# Patient Record
Sex: Male | Born: 1940
Health system: Southern US, Community
[De-identification: ages and names within clinical notes are randomized; demographics above are authoritative.]

## PROBLEM LIST (undated history)

## (undated) DIAGNOSIS — H9190 Unspecified hearing loss, unspecified ear: Secondary | ICD-10-CM

## (undated) DIAGNOSIS — J189 Pneumonia, unspecified organism: Secondary | ICD-10-CM

## (undated) DIAGNOSIS — I499 Cardiac arrhythmia, unspecified: Secondary | ICD-10-CM

## (undated) DIAGNOSIS — H409 Unspecified glaucoma: Secondary | ICD-10-CM

## (undated) DIAGNOSIS — Z8711 Personal history of peptic ulcer disease: Secondary | ICD-10-CM

## (undated) DIAGNOSIS — Z87442 Personal history of urinary calculi: Secondary | ICD-10-CM

## (undated) DIAGNOSIS — Z8719 Personal history of other diseases of the digestive system: Secondary | ICD-10-CM

## (undated) DIAGNOSIS — I10 Essential (primary) hypertension: Secondary | ICD-10-CM

## (undated) DIAGNOSIS — Z8669 Personal history of other diseases of the nervous system and sense organs: Secondary | ICD-10-CM

## (undated) DIAGNOSIS — G4730 Sleep apnea, unspecified: Secondary | ICD-10-CM

## (undated) DIAGNOSIS — M1990 Unspecified osteoarthritis, unspecified site: Secondary | ICD-10-CM

## (undated) DIAGNOSIS — I4891 Unspecified atrial fibrillation: Secondary | ICD-10-CM

## (undated) DIAGNOSIS — G629 Polyneuropathy, unspecified: Secondary | ICD-10-CM

## (undated) DIAGNOSIS — R7303 Prediabetes: Secondary | ICD-10-CM

## (undated) DIAGNOSIS — K219 Gastro-esophageal reflux disease without esophagitis: Secondary | ICD-10-CM

## (undated) DIAGNOSIS — F419 Anxiety disorder, unspecified: Secondary | ICD-10-CM

## (undated) HISTORY — DX: Gastro-esophageal reflux disease without esophagitis: K21.9

## (undated) HISTORY — DX: Essential (primary) hypertension: I10

## (undated) HISTORY — DX: Unspecified glaucoma: H40.9

## (undated) HISTORY — PX: EYE SURGERY: SHX253

## (undated) HISTORY — PX: INGUINAL HERNIA REPAIR: SUR1180

## (undated) HISTORY — PX: COLONOSCOPY: SHX174

## (undated) HISTORY — PX: ESOPHAGOGASTRODUODENOSCOPY: SHX1529

## (undated) HISTORY — DX: Sleep apnea, unspecified: G47.30

## (undated) HISTORY — DX: Polyneuropathy, unspecified: G62.9

---

## 2011-09-07 HISTORY — PX: OTHER SURGICAL HISTORY: SHX169

## 2012-09-06 HISTORY — PX: CATARACT EXTRACTION: SUR2

## 2014-09-18 DIAGNOSIS — I10 Essential (primary) hypertension: Secondary | ICD-10-CM | POA: Diagnosis not present

## 2014-09-18 DIAGNOSIS — K2970 Gastritis, unspecified, without bleeding: Secondary | ICD-10-CM | POA: Diagnosis not present

## 2014-09-18 DIAGNOSIS — M159 Polyosteoarthritis, unspecified: Secondary | ICD-10-CM | POA: Diagnosis not present

## 2014-09-18 DIAGNOSIS — K589 Irritable bowel syndrome without diarrhea: Secondary | ICD-10-CM | POA: Diagnosis not present

## 2014-09-21 DIAGNOSIS — Z1389 Encounter for screening for other disorder: Secondary | ICD-10-CM | POA: Diagnosis not present

## 2014-09-26 DIAGNOSIS — N201 Calculus of ureter: Secondary | ICD-10-CM | POA: Diagnosis not present

## 2014-10-03 DIAGNOSIS — K589 Irritable bowel syndrome without diarrhea: Secondary | ICD-10-CM | POA: Diagnosis not present

## 2014-10-03 DIAGNOSIS — M1990 Unspecified osteoarthritis, unspecified site: Secondary | ICD-10-CM | POA: Diagnosis not present

## 2014-10-03 DIAGNOSIS — N201 Calculus of ureter: Secondary | ICD-10-CM | POA: Diagnosis not present

## 2014-10-03 DIAGNOSIS — K219 Gastro-esophageal reflux disease without esophagitis: Secondary | ICD-10-CM | POA: Diagnosis not present

## 2014-10-03 DIAGNOSIS — I10 Essential (primary) hypertension: Secondary | ICD-10-CM | POA: Diagnosis not present

## 2014-10-04 DIAGNOSIS — K2970 Gastritis, unspecified, without bleeding: Secondary | ICD-10-CM | POA: Diagnosis not present

## 2014-10-04 DIAGNOSIS — I10 Essential (primary) hypertension: Secondary | ICD-10-CM | POA: Diagnosis not present

## 2014-10-04 DIAGNOSIS — R601 Generalized edema: Secondary | ICD-10-CM | POA: Diagnosis not present

## 2014-10-04 DIAGNOSIS — N200 Calculus of kidney: Secondary | ICD-10-CM | POA: Diagnosis not present

## 2014-10-04 DIAGNOSIS — K589 Irritable bowel syndrome without diarrhea: Secondary | ICD-10-CM | POA: Diagnosis not present

## 2014-10-08 DIAGNOSIS — Z466 Encounter for fitting and adjustment of urinary device: Secondary | ICD-10-CM | POA: Diagnosis not present

## 2014-10-08 DIAGNOSIS — M1990 Unspecified osteoarthritis, unspecified site: Secondary | ICD-10-CM | POA: Diagnosis not present

## 2014-10-08 DIAGNOSIS — K589 Irritable bowel syndrome without diarrhea: Secondary | ICD-10-CM | POA: Diagnosis not present

## 2014-10-08 DIAGNOSIS — K219 Gastro-esophageal reflux disease without esophagitis: Secondary | ICD-10-CM | POA: Diagnosis not present

## 2014-10-08 DIAGNOSIS — I878 Other specified disorders of veins: Secondary | ICD-10-CM | POA: Diagnosis not present

## 2014-10-08 DIAGNOSIS — I10 Essential (primary) hypertension: Secondary | ICD-10-CM | POA: Diagnosis not present

## 2014-10-08 DIAGNOSIS — N201 Calculus of ureter: Secondary | ICD-10-CM | POA: Diagnosis not present

## 2014-10-21 DIAGNOSIS — N201 Calculus of ureter: Secondary | ICD-10-CM | POA: Diagnosis not present

## 2014-11-01 DIAGNOSIS — H4011X3 Primary open-angle glaucoma, severe stage: Secondary | ICD-10-CM | POA: Diagnosis not present

## 2014-11-04 DIAGNOSIS — N200 Calculus of kidney: Secondary | ICD-10-CM | POA: Diagnosis not present

## 2014-11-04 DIAGNOSIS — R601 Generalized edema: Secondary | ICD-10-CM | POA: Diagnosis not present

## 2014-11-04 DIAGNOSIS — I499 Cardiac arrhythmia, unspecified: Secondary | ICD-10-CM | POA: Diagnosis not present

## 2014-11-04 DIAGNOSIS — M159 Polyosteoarthritis, unspecified: Secondary | ICD-10-CM | POA: Diagnosis not present

## 2014-12-02 DIAGNOSIS — H908 Mixed conductive and sensorineural hearing loss, unspecified: Secondary | ICD-10-CM | POA: Diagnosis not present

## 2014-12-03 DIAGNOSIS — I509 Heart failure, unspecified: Secondary | ICD-10-CM | POA: Diagnosis not present

## 2014-12-03 DIAGNOSIS — B9710 Unspecified enterovirus as the cause of diseases classified elsewhere: Secondary | ICD-10-CM | POA: Diagnosis not present

## 2014-12-03 DIAGNOSIS — J029 Acute pharyngitis, unspecified: Secondary | ICD-10-CM | POA: Diagnosis not present

## 2014-12-05 DIAGNOSIS — H902 Conductive hearing loss, unspecified: Secondary | ICD-10-CM | POA: Diagnosis not present

## 2014-12-05 DIAGNOSIS — H6520 Chronic serous otitis media, unspecified ear: Secondary | ICD-10-CM | POA: Diagnosis not present

## 2014-12-13 DIAGNOSIS — M7062 Trochanteric bursitis, left hip: Secondary | ICD-10-CM | POA: Diagnosis not present

## 2014-12-13 DIAGNOSIS — M25552 Pain in left hip: Secondary | ICD-10-CM | POA: Diagnosis not present

## 2014-12-16 DIAGNOSIS — H908 Mixed conductive and sensorineural hearing loss, unspecified: Secondary | ICD-10-CM | POA: Diagnosis not present

## 2014-12-26 DIAGNOSIS — I10 Essential (primary) hypertension: Secondary | ICD-10-CM | POA: Diagnosis not present

## 2014-12-26 DIAGNOSIS — I83028 Varicose veins of left lower extremity with ulcer other part of lower leg: Secondary | ICD-10-CM | POA: Diagnosis not present

## 2014-12-26 DIAGNOSIS — R58 Hemorrhage, not elsewhere classified: Secondary | ICD-10-CM | POA: Diagnosis not present

## 2015-02-07 DIAGNOSIS — R601 Generalized edema: Secondary | ICD-10-CM | POA: Diagnosis not present

## 2015-02-07 DIAGNOSIS — I6000 Nontraumatic subarachnoid hemorrhage from unspecified carotid siphon and bifurcation: Secondary | ICD-10-CM | POA: Diagnosis not present

## 2015-02-07 DIAGNOSIS — M109 Gout, unspecified: Secondary | ICD-10-CM | POA: Diagnosis not present

## 2015-02-07 DIAGNOSIS — I509 Heart failure, unspecified: Secondary | ICD-10-CM | POA: Diagnosis not present

## 2015-02-20 DIAGNOSIS — H25811 Combined forms of age-related cataract, right eye: Secondary | ICD-10-CM | POA: Diagnosis not present

## 2015-02-28 DIAGNOSIS — L03032 Cellulitis of left toe: Secondary | ICD-10-CM | POA: Diagnosis not present

## 2015-03-12 DIAGNOSIS — Z87820 Personal history of traumatic brain injury: Secondary | ICD-10-CM | POA: Diagnosis not present

## 2015-03-12 DIAGNOSIS — R7309 Other abnormal glucose: Secondary | ICD-10-CM | POA: Diagnosis not present

## 2015-03-12 DIAGNOSIS — K219 Gastro-esophageal reflux disease without esophagitis: Secondary | ICD-10-CM | POA: Diagnosis not present

## 2015-03-12 DIAGNOSIS — Z96611 Presence of right artificial shoulder joint: Secondary | ICD-10-CM | POA: Diagnosis not present

## 2015-03-12 DIAGNOSIS — Z9181 History of falling: Secondary | ICD-10-CM | POA: Diagnosis not present

## 2015-03-12 DIAGNOSIS — Z974 Presence of external hearing-aid: Secondary | ICD-10-CM | POA: Diagnosis not present

## 2015-03-12 DIAGNOSIS — H4011X3 Primary open-angle glaucoma, severe stage: Secondary | ICD-10-CM | POA: Diagnosis not present

## 2015-03-12 DIAGNOSIS — H25811 Combined forms of age-related cataract, right eye: Secondary | ICD-10-CM | POA: Diagnosis not present

## 2015-03-12 DIAGNOSIS — Z87442 Personal history of urinary calculi: Secondary | ICD-10-CM | POA: Diagnosis not present

## 2015-03-12 DIAGNOSIS — M1991 Primary osteoarthritis, unspecified site: Secondary | ICD-10-CM | POA: Diagnosis not present

## 2015-03-12 DIAGNOSIS — R600 Localized edema: Secondary | ICD-10-CM | POA: Diagnosis not present

## 2015-03-12 DIAGNOSIS — I10 Essential (primary) hypertension: Secondary | ICD-10-CM | POA: Diagnosis not present

## 2015-03-13 DIAGNOSIS — Z9841 Cataract extraction status, right eye: Secondary | ICD-10-CM | POA: Diagnosis not present

## 2015-03-27 DIAGNOSIS — L600 Ingrowing nail: Secondary | ICD-10-CM | POA: Diagnosis not present

## 2015-03-27 DIAGNOSIS — M779 Enthesopathy, unspecified: Secondary | ICD-10-CM | POA: Diagnosis not present

## 2015-05-06 DIAGNOSIS — H409 Unspecified glaucoma: Secondary | ICD-10-CM | POA: Diagnosis not present

## 2015-05-06 DIAGNOSIS — I8393 Asymptomatic varicose veins of bilateral lower extremities: Secondary | ICD-10-CM | POA: Diagnosis not present

## 2015-05-06 DIAGNOSIS — I10 Essential (primary) hypertension: Secondary | ICD-10-CM | POA: Diagnosis not present

## 2015-05-06 DIAGNOSIS — M25552 Pain in left hip: Secondary | ICD-10-CM | POA: Diagnosis not present

## 2015-05-06 DIAGNOSIS — R609 Edema, unspecified: Secondary | ICD-10-CM | POA: Diagnosis not present

## 2015-05-06 DIAGNOSIS — Z79899 Other long term (current) drug therapy: Secondary | ICD-10-CM | POA: Diagnosis not present

## 2015-05-08 ENCOUNTER — Ambulatory Visit: Payer: Medicare Other | Attending: Family Medicine

## 2015-05-08 DIAGNOSIS — M25659 Stiffness of unspecified hip, not elsewhere classified: Secondary | ICD-10-CM

## 2015-05-08 DIAGNOSIS — M25559 Pain in unspecified hip: Secondary | ICD-10-CM | POA: Diagnosis not present

## 2015-05-08 NOTE — Patient Instructions (Signed)
Knee to Chest (Flexion)   Pull knee toward chest. Feel stretch in lower back or buttock area. Breathing deeply, Hold __20__ seconds. Repeat with other knee. Repeat _3___ times. Do _3___ sessions per day.  http://gt2.exer.us/225   Copyright  VHI. All rights reserved.  Butterfly, Supine   Lie on back, feet together. Lower knees toward floor. Hold _20__ seconds. Repeat _3__ times per session. Do _3__ sessions per day.  Copyright  VHI. All rights reserved.  HIP: Hamstrings - Short Sitting   Rest leg on raised surface. Keep knee straight. Lift chest. Hold _20__ seconds. _3__ reps per set, _3__ sets per day  Copyright  VHI. All rights reserved.  [facility] [address] [place], [place] [address] Phone # [phone] Fax [phone]

## 2015-05-08 NOTE — Therapy (Signed)
[facility] [address], [place], [place], [address] Phone: [phone]   Fax:  [phone]  Physical Therapy Evaluation  Patient Details  Name: Glen Bolton MRN: [id number] Date of Birth: 12-Jul-1941 Referring Provider:  Lujean Amel, MD  Encounter Date: 05/08/2015      PT End of Session - 05/08/15 1101    Visit Number 1   Number of Visits 10   Date for PT Re-Evaluation 07/03/15   PT Start Time 1030   PT Stop Time 1100   PT Time Calculation (min) 30 min   Activity Tolerance Patient tolerated treatment well;Other (comment)  pt 15 minutes late   Behavior During Therapy WFL for tasks assessed/performed      Past Medical History  Diagnosis Date  . Hypertension   . GERD (gastroesophageal reflux disease)     Past Surgical History  Procedure Laterality Date  . Eye surgery    . Rotator cuff surgery Right 2013    There were no vitals filed for this visit.  Visit Diagnosis:  Hip pain, unspecified laterality - Plan: PT plan of care cert/re-cert  Stiffness of hip joint, unspecified laterality - Plan: PT plan of care cert/re-cert      Subjective Assessment - 05/08/15 1033    Subjective Pt reports to PT with complaints of bilateral hip and knee pain of a chronic nature.  MD suspects OA.  Pt has had injections into knees 5 months ago.  Pt was working full time until about 2 months ago.  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   Pertinent History fall last year at work   Limitations Walking;Standing   How long can you stand comfortably? 1-2 hours   How long can you walk comfortably? 1-2 hours   Diagnostic tests x-ray of lumbar spine and Lt hip- result was OA, DDD per pt report.  Bursitis in Lt hip.     Currently in Pain? Yes   Pain Location Hip  and knees   Pain Orientation Right;Left   Pain Descriptors / Indicators Dull;Aching   Pain Type Chronic pain   Pain Onset More than a month ago   Pain Frequency Intermittent   Aggravating Factors  standing and walking, bending down to pick-up golf ball.     Pain Relieving Factors not taking meds due to sensitivity, rest/not standing   Effect of Pain on Daily Activities limited standing and walking   Multiple Pain Sites No            [facility] PT Assessment - 05/08/15 0001    Assessment   Medical Diagnosis Lt hip pain- evaluate both hips   Onset Date/Surgical Date 05/07/13   Next MD Visit none scheduled   Precautions   Precautions None   Restrictions   Weight Bearing Restrictions No   Balance Screen   Has the patient fallen in the past 6 months No   Has the patient had a decrease in activity level because of a fear of falling?  No   Is the patient reluctant to leave their home because of a fear of falling?  No   Home Environment   Living Environment Private residence   Living Arrangements Spouse/significant other   Type of [employer] Access Level entry   Prior Function   Level of Independence Independent   Cognition   Overall Cognitive Status Within Functional Limits for tasks assessed   Observation/Other Assessments   Focus on  Therapeutic Outcomes (FOTO)  60% limitation   Posture/Postural Control   Posture/Postural Control Postural limitations   Postural Limitations Increased thoracic kyphosis;Rounded Shoulders;Forward head   ROM / Strength   AROM / PROM / Strength AROM;PROM;Strength   AROM   Overall AROM  Within functional limits for tasks performed   Overall AROM Comments Lumbar AROM is full withtout pain    PROM   Overall PROM  Deficits   Overall PROM Comments Bilateral hip flexibility limited by 25-50%.  Most limited with SLR and abduction   Strength   Overall Strength Within functional limits for tasks performed   Overall Strength Comments 4+/5 to 5/5 bilateral LE strength   Palpation   Palpation comment No significant palpable tenderness today.    Ambulation/Gait   Ambulation/Gait Yes    Ambulation/Gait Assistance 7: Independent   Gait Pattern Within Functional Limits                           PT Education - 05/08/15 1052    Education provided Yes   Education Details HEP: butterfly, hamstring and knee to chest stretch   Person(s) Educated Patient   Methods Explanation;Demonstration;Handout   Comprehension Verbalized understanding;Returned demonstration          PT Short Term Goals - 05/08/15 1107    PT SHORT TERM GOAL #1   Title be independent in initial HEP   Time 4   Period Weeks   Status New   PT SHORT TERM GOAL #2   Title report 30% reduction in bil. hip pain with standing and walking   Time 4   Period Weeks   Status New           PT Long Term Goals - 05/08/15 1039    PT LONG TERM GOAL #1   Title be independent in advanced HEP   Time 8   Period Weeks   Status New   PT LONG TERM GOAL #2   Title reduce FOTO to < or = to 47% limitaiton   Time 8   Period Weeks   Status New   PT LONG TERM GOAL #3   Title report a 60% reduction in bilateral hip pain with standing and walking   Time 8   Period Weeks   Status New   PT LONG TERM GOAL #4   Title stand and walk 50% longer before need to rest due to pain   Time 8   Period Weeks   Status New               Plan - 05/08/15 1105    Clinical Impression Statement Pt presents to PT with complaints of bilateral hip and knee pain of a chronic nature.  Pt had recent x-ray indicating OA and Lt hip bursitis. Pt demonstrates stiffness in both hips and reports painful and limited standing tolerance.  FOTO score is 60% limitation.  Pt will benefit from skilled PT to improve hip flexiblity and strength to improve abilty to stand longer with less pain.     Pt will benefit from skilled therapeutic intervention in order to improve on the following deficits Pain;Impaired flexibility;Decreased activity tolerance   Rehab Potential Good   PT Frequency 2x / week   PT Duration 8 weeks   PT  Treatment/Interventions ADLs/Self Care Home Management;Moist Heat;Electrical Stimulation;Cryotherapy;Ultrasound;Functional mobility training;Therapeutic activities;Therapeutic exercise;Manual techniques;Patient/family education;Neuromuscular re-education;Passive range of motion   PT Next Visit Plan Bilateral hip flexibility and strength  Consulted and Agree with Plan of Care Patient          G-Codes - May 17, 2015 1030    Functional Assessment Tool Used FOTO: 60% limitation   Functional Limitation Mobility: Walking and moving around   Mobility: Walking and Moving Around Current Status [id number]) At least 60 percent but less than 80 percent impaired, limited or restricted   Mobility: Walking and Moving Around Goal Status [phone]) At least 40 percent but less than 60 percent impaired, limited or restricted       Problem List There are no active problems to display for this patient.   Tahesha Skeet, PT May 17, 2015, 11:10 AM  [facility] [address] [place], [place], [address] Phone: [phone]   Fax:  [phone]

## 2015-05-13 ENCOUNTER — Ambulatory Visit: Payer: Medicare Other | Admitting: Rehabilitation

## 2015-05-13 DIAGNOSIS — M25659 Stiffness of unspecified hip, not elsewhere classified: Secondary | ICD-10-CM | POA: Diagnosis not present

## 2015-05-13 DIAGNOSIS — M25559 Pain in unspecified hip: Secondary | ICD-10-CM | POA: Diagnosis not present

## 2015-05-13 NOTE — Therapy (Signed)
[facility] [address], [place], [place], [address] Phone: [phone]   Fax:  [phone]  Physical Therapy Treatment  Patient Details  Name: Glen Bolton MRN: [id number] Date of Birth: 04/02/1941 Referring Provider:  Lujean Amel, MD  Encounter Date: 05/13/2015      PT End of Session - 05/13/15 1446    Visit Number 2   Number of Visits 10   Date for PT Re-Evaluation 07/03/15   PT Start Time 1400   PT Stop Time 1445   PT Time Calculation (min) 45 min   Activity Tolerance Patient tolerated treatment well      Past Medical History  Diagnosis Date  . Hypertension   . GERD (gastroesophageal reflux disease)     Past Surgical History  Procedure Laterality Date  . Eye surgery    . Rotator cuff surgery Right 2013    There were no vitals filed for this visit.  Visit Diagnosis:  Hip pain, unspecified laterality  Stiffness of hip joint, unspecified laterality      Subjective Assessment - 05/13/15 1406    Subjective only feeling some soreness with the new stretches.  no new complaints   Currently in Pain? Yes   Pain Score 1    Pain Location Hip   Pain Orientation Right;Left   Aggravating Factors  walking uphill                         [facility] Adult PT Treatment/Exercise - 05/13/15 0001    Exercises   Exercises Knee/Hip   Knee/Hip Exercises: Stretches   Passive Hamstring Stretch Both;3 reps;20 seconds   Passive Hamstring Stretch Limitations with strap   Quad Stretch Both;3 reps;20 seconds   Quad Stretch Limitations prone   Hip Flexor Stretch Both;2 reps;30 seconds   Hip Flexor Stretch Limitations off edge of plinth    Piriformis Stretch Both;3 reps;20 seconds   Other Knee/Hip Stretches butterfly stretch supine 3x20   Knee/Hip Exercises: Aerobic   Recumbent Bike level 2 x 56min   Manual Therapy   Manual therapy comments manual hip PROM to limitations and joint distraction with the  belt plus oscillations into IR/ER bil grade IV                  PT Short Term Goals - 05/08/15 1107    PT SHORT TERM GOAL #1   Title be independent in initial HEP   Time 4   Period Weeks   Status New   PT SHORT TERM GOAL #2   Title report 30% reduction in bil. hip pain with standing and walking   Time 4   Period Weeks   Status New           PT Long Term Goals - 05/08/15 1039    PT LONG TERM GOAL #1   Title be independent in advanced HEP   Time 8   Period Weeks   Status New   PT LONG TERM GOAL #2   Title reduce FOTO to < or = to 47% limitaiton   Time 8   Period Weeks   Status New   PT LONG TERM GOAL #3   Title report a 60% reduction in bilateral hip pain with standing and walking   Time 8   Period Weeks   Status New   PT LONG TERM GOAL #4   Title stand and walk 50% longer before need to rest due to pain  Time 8   Period Weeks   Status New               Plan - 05/13/15 1423    Clinical Impression Statement pt presents with good knowledge of stretches today.  significant flexibility limitations R>L but bilaterally.  focused on flexibility today with progression to strength   Pt will benefit from skilled therapeutic intervention in order to improve on the following deficits Impaired flexibility;Decreased activity tolerance   Rehab Potential Good   PT Next Visit Plan Bilateral hip flexibility and strength        Problem List There are no active problems to display for this patient.   Stark Bray, DPT, CMP 05/13/2015, 2:47 PM  [facility] [address], [place], [place], [address] Phone: [phone]   Fax:  [phone]

## 2015-05-21 ENCOUNTER — Ambulatory Visit: Payer: Medicare Other | Admitting: Physical Therapy

## 2015-05-21 ENCOUNTER — Encounter: Payer: Self-pay | Admitting: Physical Therapy

## 2015-05-21 ENCOUNTER — Encounter: Payer: Self-pay | Admitting: Podiatry

## 2015-05-21 ENCOUNTER — Ambulatory Visit (INDEPENDENT_AMBULATORY_CARE_PROVIDER_SITE_OTHER): Payer: Medicare Other | Admitting: Podiatry

## 2015-05-21 VITALS — BP 122/82 | HR 77 | Resp 17

## 2015-05-21 DIAGNOSIS — M79676 Pain in unspecified toe(s): Secondary | ICD-10-CM

## 2015-05-21 DIAGNOSIS — M25559 Pain in unspecified hip: Secondary | ICD-10-CM | POA: Diagnosis not present

## 2015-05-21 DIAGNOSIS — B351 Tinea unguium: Secondary | ICD-10-CM

## 2015-05-21 DIAGNOSIS — M25659 Stiffness of unspecified hip, not elsewhere classified: Secondary | ICD-10-CM

## 2015-05-21 NOTE — Therapy (Signed)
[facility] [address], [place], [place], [address] Phone: [phone]   Fax:  [phone]  Physical Therapy Treatment  Patient Details  Name: Glen Bolton MRN: [id number] Date of Birth: 06-05-1941 Referring Provider:  Lujean Amel, MD  Encounter Date: 05/21/2015      PT End of Session - 05/21/15 1204    Visit Number 3   Number of Visits 10   Date for PT Re-Evaluation 07/03/15   PT Start Time 1601   PT Stop Time 1230   PT Time Calculation (min) 43 min   Activity Tolerance Patient tolerated treatment well   Behavior During Therapy [facility] for tasks assessed/performed      Past Medical History  Diagnosis Date  . Hypertension   . GERD (gastroesophageal reflux disease)     Past Surgical History  Procedure Laterality Date  . Eye surgery    . Rotator cuff surgery Right 2013    There were no vitals filed for this visit.  Visit Diagnosis:  Stiffness of hip joint, unspecified laterality  Hip pain, unspecified laterality      Subjective Assessment - 05/21/15 1151    Subjective My hips are feeling more flexible. Pt is experiencing some vertigo today.    Currently in Pain? No/denies  More stiff    Multiple Pain Sites No                         [facility] Adult PT Treatment/Exercise - 05/21/15 0001    Knee/Hip Exercises: Stretches   Active Hamstring Stretch Both;3 reps;20 seconds   Active Hamstring Stretch Limitations Used strap   Quad Stretch Both;2 reps;20 seconds  performed supine with foam roll under pelvis.   Other Knee/Hip Stretches Single knee to chest with towel  3x15 sec hold bil.    Knee/Hip Exercises: Aerobic   Nustep L1 seat #9 6 min   Knee/Hip Exercises: Supine   Other Supine Knee/Hip Exercises AROM IR/ER 20x  flexion 20x                   PT Short Term Goals - 05/21/15 1159    PT SHORT TERM GOAL #1   Title be independent in initial HEP   Time 4   Period Weeks    Status Achieved   PT SHORT TERM GOAL #2   Title report 30% reduction in bil. hip pain with standing and walking   Period Weeks   Status --  10%-15%           PT Long Term Goals - 05/08/15 1039    PT LONG TERM GOAL #1   Title be independent in advanced HEP   Time 8   Period Weeks   Status New   PT LONG TERM GOAL #2   Title reduce FOTO to < or = to 47% limitaiton   Time 8   Period Weeks   Status New   PT LONG TERM GOAL #3   Title report a 60% reduction in bilateral hip pain with standing and walking   Time 8   Period Weeks   Status New   PT LONG TERM GOAL #4   Title stand and walk 50% longer before need to rest due to pain   Time 8   Period Weeks   Status New               Plan - 05/21/15 1210    Clinical Impression Statement Pt  performed all hip stretches well, asking him to relax into them sllightly more. Pain is already 10%-15%  reduced with standing/walking activities. Tried to deepend stretch into quads and hip flexors using foam roll.    Pt will benefit from skilled therapeutic intervention in order to improve on the following deficits Impaired flexibility;Decreased activity tolerance   Rehab Potential Good   PT Frequency 2x / week   PT Duration 8 weeks   PT Treatment/Interventions ADLs/Self Care Home Management;Moist Heat;Electrical Stimulation;Cryotherapy;Ultrasound;Functional mobility training;Therapeutic activities;Therapeutic exercise;Manual techniques;Patient/family education;Neuromuscular re-education;Passive range of motion   PT Next Visit Plan Bilateral hip flexibility and strength   Consulted and Agree with Plan of Care Patient        Problem List There are no active problems to display for this patient.   COCHRAN,JENNIFER, PTA 05/21/2015, 12:22 PM  [facility] [address] [place], [place], [address] Phone: [phone]   Fax:  [phone]

## 2015-05-21 NOTE — Progress Notes (Signed)
Subjective:     Patient ID: Glen Bolton, male   DOB: 1941-07-11, 74 y.o.   MRN: [id number]  HPIThis patient presents to office fir nail care of both feet.  He previously was seen by Dr. Gershon Mussel who performed an I & D medial border left big toe.  He says the pain and redness has resolved and he now has black tissue in nail groove.  The hallux nails are painful from pressure.   Review of Systems     Objective:   Physical Exam GENERAL APPEARANCE: Alert, conversant. Appropriately groomed. No acute distress.  VASCULAR: Pedal pulses palpable at  [facility] and PT bilateral.  Capillary refill time is immediate to all digits,  Normal temperature gradient.  Digital hair growth is present bilateral  NEUROLOGIC: sensation is normal to 5.07 monofilament at 5/5 sites bilateral.  Light touch is intact bilateral, Muscle strength normal.  MUSCULOSKELETAL: acceptable muscle strength, tone and stability bilateral.  Intrinsic muscluature intact bilateral.  Rectus appearance of foot and digits noted bilateral.  Asymptomatic HAV B/L  DERMATOLOGIC: skin color, texture, and turgor are within normal limits.  No preulcerative lesions or ulcers  are seen, no interdigital maceration noted.  No open lesions present.  . No drainage noted. NAILS  Thick disfigured incurvated nails both big toes. Healing along medial border left hallux.      Assessment:     Onychomycosis     Plan:     IE  Debride nails.  RTC 3 months.

## 2015-05-23 ENCOUNTER — Ambulatory Visit: Payer: Medicare Other | Admitting: Physical Therapy

## 2015-05-23 ENCOUNTER — Encounter: Payer: Self-pay | Admitting: Physical Therapy

## 2015-05-23 DIAGNOSIS — M25559 Pain in unspecified hip: Secondary | ICD-10-CM

## 2015-05-23 DIAGNOSIS — M25659 Stiffness of unspecified hip, not elsewhere classified: Secondary | ICD-10-CM | POA: Diagnosis not present

## 2015-05-23 NOTE — Therapy (Signed)
[facility] [address] [place], [place], [address] Phone: [phone]   Fax:  [phone]  Physical Therapy Treatment  Patient Details  Name: Glen Bolton MRN: [id number] Date of Birth: Apr 06, 1941 Referring Provider:  Lujean Amel, MD  Encounter Date: 05/23/2015      PT End of Session - 05/23/15 0947    Visit Number 4   Number of Visits 10   Date for PT Re-Evaluation 07/03/15   PT Start Time 0930   PT Stop Time 1015   PT Time Calculation (min) 45 min   Activity Tolerance Patient tolerated treatment well   Behavior During Therapy [facility] for tasks assessed/performed      Past Medical History  Diagnosis Date  . Hypertension   . GERD (gastroesophageal reflux disease)     Past Surgical History  Procedure Laterality Date  . Eye surgery    . Rotator cuff surgery Right 2013    There were no vitals filed for this visit.  Visit Diagnosis:  Stiffness of hip joint, unspecified laterality  Hip pain, unspecified laterality      Subjective Assessment - 05/23/15 0935    Subjective Pt with pain in bil hips Lt> then Rt. Reports after last session with incr back pain, wishes not to practice on faom roll today. Pain in bil hips rated as 1-2/10 today   Currently in Pain? Yes   Pain Score 2    Pain Location Hip   Pain Orientation Right;Left   Pain Descriptors / Indicators Aching;Dull   Pain Type Chronic pain   Pain Onset More than a month ago   Pain Frequency Intermittent   Multiple Pain Sites No                         [facility] Adult PT Treatment/Exercise - 05/23/15 0001    Exercises   Exercises Knee/Hip   Knee/Hip Exercises: Stretches   Active Hamstring Stretch Both;3 reps;20 seconds  at stairs   Sports administrator Both;3 reps;20 seconds   with contract/relax techniue, and practiced with strap for    Hip Flexor Stretch Both;3 reps;30 seconds  in thomasgrip at hi/lo table   Other Knee/Hip  Stretches Single knee to chest with towel  with strap   Knee/Hip Exercises: Aerobic   Nustep L1 seat & arms #9 x 8 min   Knee/Hip Exercises: Prone   Other Prone Exercises Cobra x 10 with 5 sec hold pillow under belly    Manual Therapy   Manual therapy comments traction grade 3 including ER/IR, Rt hip into abd with flexed hip/knee grade 3                   PT Short Term Goals - 05/21/15 1159    PT SHORT TERM GOAL #1   Title be independent in initial HEP   Time 4   Period Weeks   Status Achieved   PT SHORT TERM GOAL #2   Title report 30% reduction in bil. hip pain with standing and walking   Period Weeks   Status --  10%-15%           PT Long Term Goals - 05/08/15 1039    PT LONG TERM GOAL #1   Title be independent in advanced HEP   Time 8   Period Weeks   Status New   PT LONG TERM GOAL #2   Title reduce FOTO to < or = to 47% limitaiton  Time 8   Period Weeks   Status New   PT LONG TERM GOAL #3   Title report a 60% reduction in bilateral hip pain with standing and walking   Time 8   Period Weeks   Status New   PT LONG TERM GOAL #4   Title stand and walk 50% longer before need to rest due to pain   Time 8   Period Weeks   Status New               Plan - 05/23/15 0947    Clinical Impression Statement Pt with godd demo of stretching exercises, but limited by tight muscle grpoups.. Pt will continue to benefit from skilled PT to adress flexibility bil hip, strength and endurance   Rehab Potential Good   PT Frequency 2x / week   PT Duration 8 weeks   PT Treatment/Interventions ADLs/Self Care Home Management;Moist Heat;Electrical Stimulation;Cryotherapy;Ultrasound;Functional mobility training;Therapeutic activities;Therapeutic exercise;Manual techniques;Patient/family education;Neuromuscular re-education;Passive range of motion   PT Next Visit Plan Bilateral hip flexibility and strength   Consulted and Agree with Plan of Care Patient         Problem List There are no active problems to display for this patient.   NAUMANN-HOUEGNIFIO,Evalise Abruzzese PTA 05/23/2015, 10:16 AM  [facility] [address] [place], [place], [address] Phone: [phone]   Fax:  [phone]

## 2015-05-27 ENCOUNTER — Ambulatory Visit: Payer: Medicare Other | Admitting: Physical Therapy

## 2015-05-27 ENCOUNTER — Encounter: Payer: Self-pay | Admitting: Physical Therapy

## 2015-05-27 DIAGNOSIS — M25559 Pain in unspecified hip: Secondary | ICD-10-CM

## 2015-05-27 DIAGNOSIS — M25659 Stiffness of unspecified hip, not elsewhere classified: Secondary | ICD-10-CM | POA: Diagnosis not present

## 2015-05-27 NOTE — Therapy (Signed)
[facility] [address], [place], [place], [address] Phone: [phone]   Fax:  [phone]  Physical Therapy Treatment  Patient Details  Name: Glen Bolton MRN: [id number] Date of Birth: Jun 05, 1941 Referring Provider:  Lujean Amel, MD  Encounter Date: 05/27/2015      PT End of Session - 05/27/15 1316    Visit Number 5   Number of Visits 10   Date for PT Re-Evaluation 07/03/15   PT Start Time 1638   PT Stop Time 1322   PT Time Calculation (min) 47 min   Activity Tolerance Patient tolerated treatment well   Behavior During Therapy [facility] for tasks assessed/performed      Past Medical History  Diagnosis Date  . Hypertension   . GERD (gastroesophageal reflux disease)     Past Surgical History  Procedure Laterality Date  . Eye surgery    . Rotator cuff surgery Right 2013    There were no vitals filed for this visit.  Visit Diagnosis:  Stiffness of hip joint, unspecified laterality  Hip pain, unspecified laterality      Subjective Assessment - 05/27/15 1238    Subjective Pt with bil hip pain Lt > Rt. Pt was able play gold this sunday, but had incr pain on Monday in bil hips and low back. Pain is in average 4-6/65 with certain motions 9/93    Currently in Pain? Yes   Pain Score 2    Pain Location Hip   Pain Orientation Right;Left   Pain Descriptors / Indicators Aching;Dull   Pain Type Chronic pain   Pain Onset More than a month ago   Multiple Pain Sites No                         [facility] Adult PT Treatment/Exercise - 05/27/15 0001    Posture/Postural Control   Posture/Postural Control Postural limitations   Postural Limitations Increased thoracic kyphosis;Rounded Shoulders;Forward head   Exercises   Exercises Knee/Hip   Knee/Hip Exercises: Stretches   Active Hamstring Stretch Both;3 reps;20 seconds  in sitting today   Quad Stretch Both;3 reps;20 seconds  by PTA   Hip Flexor  Stretch Both;3 reps;30 seconds  at side of table   Piriformis Stretch Both;3 reps  in supine, Rt side more limited than Left   Other Knee/Hip Stretches Single knee to chest with towel   Knee/Hip Exercises: Aerobic   Nustep L1 seat & arms #9 x 8 min   Knee/Hip Exercises: Prone   Other Prone Exercises Cobra x 10 with 5 sec hold pillow under belly    Manual Therapy   Manual Therapy Soft tissue mobilization   Manual therapy comments traction grade 3 in prone position each leg   Soft tissue mobilization PROM in available ER/IR with STW to gluteal and hip area bil                  PT Short Term Goals - 05/27/15 1320    PT SHORT TERM GOAL #1   Title be independent in initial HEP   Time 4   Period Weeks   Status Achieved   PT SHORT TERM GOAL #2   Title report 30% reduction in bil. hip pain with standing and walking   Time 4   Period Weeks   Status On-going           PT Long Term Goals - 05/27/15 1320    PT LONG TERM  GOAL #1   Title be independent in advanced HEP   Time 8   Period Weeks   Status On-going   PT LONG TERM GOAL #2   Title reduce FOTO to < or = to 47% limitaiton   Time 8   Period Weeks   Status On-going   PT LONG TERM GOAL #3   Title report a 60% reduction in bilateral hip pain with standing and walking   Time 8   Period Weeks   Status On-going   PT LONG TERM GOAL #4   Title stand and walk 50% longer before need to rest due to pain   Time 8   Period Weeks   Status On-going               Plan - 05/27/15 1317    Clinical Impression Statement Pt limited by tight hamstring, iliopsoas and quadriceps. Pt with incr pain today and was treated at end with ice to help with pain. Pt will continue to benefit from skilled PT to improve flexibility bil hips low back, endurance and strength.   Pt will benefit from skilled therapeutic intervention in order to improve on the following deficits Impaired flexibility;Decreased activity tolerance   Rehab  Potential Good   PT Frequency 2x / week   PT Duration 8 weeks   PT Treatment/Interventions ADLs/Self Care Home Management;Moist Heat;Electrical Stimulation;Cryotherapy;Ultrasound;Functional mobility training;Therapeutic activities;Therapeutic exercise;Manual techniques;Patient/family education;Neuromuscular re-education;Passive range of motion   PT Next Visit Plan Bilateral hip flexibility and manual therapy. (Pt does not like foam roll)   Consulted and Agree with Plan of Care Patient        Problem List There are no active problems to display for this patient.   NAUMANN-HOUEGNIFIO,ELKE PTA 05/27/2015, 1:25 PM  [facility] [address] [place], [place], [address] Phone: [phone]   Fax:  [phone]

## 2015-05-30 ENCOUNTER — Ambulatory Visit: Payer: Medicare Other | Admitting: Physical Therapy

## 2015-05-30 ENCOUNTER — Encounter: Payer: Self-pay | Admitting: Physical Therapy

## 2015-05-30 DIAGNOSIS — M25559 Pain in unspecified hip: Secondary | ICD-10-CM

## 2015-05-30 DIAGNOSIS — M25659 Stiffness of unspecified hip, not elsewhere classified: Secondary | ICD-10-CM | POA: Diagnosis not present

## 2015-05-30 NOTE — Therapy (Signed)
[facility] [address], [place], [place], [address] Phone: [phone]   Fax:  [phone]  Physical Therapy Treatment  Patient Details  Name: Glen Bolton MRN: [id number] Date of Birth: 22-Mar-1941 Referring Provider:  Lujean Amel, MD  Encounter Date: 05/30/2015      PT End of Session - 05/30/15 1009    Visit Number 6   Number of Visits 10   Date for PT Re-Evaluation 07/03/15   PT Start Time 0928   PT Stop Time 1020   PT Time Calculation (min) 52 min   Activity Tolerance Patient tolerated treatment well   Behavior During Therapy [facility] for tasks assessed/performed      Past Medical History  Diagnosis Date  . Hypertension   . GERD (gastroesophageal reflux disease)     Past Surgical History  Procedure Laterality Date  . Eye surgery    . Rotator cuff surgery Right 2013    There were no vitals filed for this visit.  Visit Diagnosis:  Stiffness of hip joint, unspecified laterality  Hip pain, unspecified laterality      Subjective Assessment - 05/30/15 0931    Subjective Yesterday everything back and down the legs ached. Could not get comfortable. Today is better. Thinking it MAY be weather related.   Currently in Pain? Yes   Pain Score 1    Pain Location Hip   Pain Orientation Left   Pain Descriptors / Indicators Dull   Aggravating Factors  Not sure yesterday   Pain Relieving Factors Nothing really helped yesterday.   Multiple Pain Sites No                         [facility] Adult PT Treatment/Exercise - 05/30/15 0001    Knee/Hip Exercises: Stretches   Hip Flexor Stretch Both;3 reps;20 seconds  Had leg supported on table today, better at back.    Knee: Self-Stretch to increase Flexion --  Single knee to chest bil 3x 20 sec   Other Knee/Hip Stretches Lower trunk rotation AROM 20x    Other Knee/Hip Stretches AROM hip flexion 10 bil pre static stretch   Knee/Hip Exercises: Aerobic   Nustep L3 x 10 min   Cryotherapy   Number Minutes Cryotherapy 10 Minutes   Cryotherapy Location Hip   Type of Cryotherapy Ice pack   Manual Therapy   Manual Therapy Soft tissue mobilization  Bil greater trochanter area with spikey roller in SL                  PT Short Term Goals - 05/27/15 1320    PT SHORT TERM GOAL #1   Title be independent in initial HEP   Time 4   Period Weeks   Status Achieved   PT SHORT TERM GOAL #2   Title report 30% reduction in bil. hip pain with standing and walking   Time 4   Period Weeks   Status On-going           PT Long Term Goals - 05/27/15 1320    PT LONG TERM GOAL #1   Title be independent in advanced HEP   Time 8   Period Weeks   Status On-going   PT LONG TERM GOAL #2   Title reduce FOTO to < or = to 47% limitaiton   Time 8   Period Weeks   Status On-going   PT LONG TERM GOAL #3   Title report a  60% reduction in bilateral hip pain with standing and walking   Time 8   Period Weeks   Status On-going   PT LONG TERM GOAL #4   Title stand and walk 50% longer before need to rest due to pain   Time 8   Period Weeks   Status On-going               Plan - 05/30/15 1010    Clinical Impression Statement Pt's hips and back were achey about mid week and he thinks there was too much back extension done this week. We modiified some stretches and elionimatedthe Cobra today to see if this helps.    Pt will benefit from skilled therapeutic intervention in order to improve on the following deficits Impaired flexibility;Decreased activity tolerance   Rehab Potential Good   PT Frequency 2x / week   PT Duration 8 weeks   PT Treatment/Interventions ADLs/Self Care Home Management;Moist Heat;Electrical Stimulation;Cryotherapy;Ultrasound;Functional mobility training;Therapeutic activities;Therapeutic exercise;Manual techniques;Patient/family education;Neuromuscular re-education;Passive range of motion   PT Next Visit Plan Bilateral  hip flexibility and manual therapy. (Pt does not like foam roll)   Consulted and Agree with Plan of Care Patient        Problem List There are no active problems to display for this patient.   COCHRAN,JENNIFER, PTA 05/30/2015, 10:12 AM   [facility] [address] [place], [place], [address] Phone: [phone]   Fax:  [phone]

## 2015-06-03 ENCOUNTER — Ambulatory Visit: Payer: Medicare Other

## 2015-06-03 DIAGNOSIS — M25659 Stiffness of unspecified hip, not elsewhere classified: Secondary | ICD-10-CM

## 2015-06-03 DIAGNOSIS — M25559 Pain in unspecified hip: Secondary | ICD-10-CM

## 2015-06-03 NOTE — Patient Instructions (Signed)
Knee High   Holding stable object, raise knee to hip level, then lower knee. Repeat with other knee. Complete __10_ repetitions. Do __2__ sessions per day.  ABDUCTION: Standing (Active)   Stand, feet flat. Lift right leg out to side. Use _0__ lbs. Complete __10_ repetitions. Perform __2_ sessions per day.    EXTENSION: Standing (Active)  Stand, both feet flat. Draw right leg behind body as far as possible. Use 0___ lbs. Complete 10 repetitions. Perform __2_ sessions per day.  Copyright  VHI. All rights reserved.   [facility] [address], [address] [place], [place] [address] Phone # [phone] Fax [phone] 

## 2015-06-03 NOTE — Therapy (Signed)
[facility] [address] [place], [place], [address] Phone: [phone]   Fax:  [phone]  Physical Therapy Treatment  Patient Details  Name: Glen Bolton MRN: [id number] Date of Birth: 1941/02/10 Referring Provider:  Lujean Amel, MD  Encounter Date: 06/03/2015      PT End of Session - 06/03/15 1302    Visit Number 7   Number of Visits 10   Date for PT Re-Evaluation 07/03/15   PT Start Time 1230   PT Stop Time 1314   PT Time Calculation (min) 44 min   Activity Tolerance Patient tolerated treatment well   Behavior During Therapy [facility] for tasks assessed/performed      Past Medical History  Diagnosis Date  . Hypertension   . GERD (gastroesophageal reflux disease)     Past Surgical History  Procedure Laterality Date  . Eye surgery    . Rotator cuff surgery Right 2013    There were no vitals filed for this visit.  Visit Diagnosis:  Stiffness of hip joint, unspecified laterality  Hip pain, unspecified laterality      Subjective Assessment - 06/03/15 1230    Subjective Feeling good.  Pt reports that he has been stretching and is feeling more flexible at times.     Currently in Pain? Yes   Pain Score 1    Pain Location Hip   Pain Orientation Right;Left   Pain Descriptors / Indicators Tightness   Pain Type Chronic pain   Pain Onset More than a month ago   Aggravating Factors  a lot of activity   Pain Relieving Factors stretching                         [facility] Adult PT Treatment/Exercise - 06/03/15 0001    Knee/Hip Exercises: Stretches   Active Hamstring Stretch Both;3 reps;20 seconds  using steps   Other Knee/Hip Stretches Lower trunk rotation AROM 20x    Knee/Hip Exercises: Aerobic   Nustep Level 3 x 10 minutes  seat 9, arms 9   Knee/Hip Exercises: Standing   Hip Flexion Stengthening;Both;2 sets;10 reps   Hip Abduction Stengthening;Both;2 sets;10 reps   Hip Extension  Stengthening;Both;2 sets;10 reps   Cryotherapy   Number Minutes Cryotherapy 10 Minutes   Cryotherapy Location Hip   Type of Cryotherapy Ice pack   Manual Therapy   Manual Therapy Soft tissue mobilization  Bil greater trochanter area with spikey roller in sidelying                PT Education - 06/03/15 1246    Education provided Yes   Education Details HEP: standing hip strength   Person(s) Educated Patient   Methods Explanation;Demonstration;Handout   Comprehension Verbalized understanding;Returned demonstration          PT Short Term Goals - 06/03/15 1233    PT SHORT TERM GOAL #2   Title report 30% reduction in bil. hip pain with standing and walking   Time 4   Period Weeks   Status On-going  30% improvement           PT Long Term Goals - 05/27/15 1320    PT LONG TERM GOAL #1   Title be independent in advanced HEP   Time 8   Period Weeks   Status On-going   PT LONG TERM GOAL #2   Title reduce FOTO to < or = to 47% limitaiton   Time 8   Period  Weeks   Status On-going   PT LONG TERM GOAL #3   Title report a 60% reduction in bilateral hip pain with standing and walking   Time 8   Period Weeks   Status On-going   PT LONG TERM GOAL #4   Title stand and walk 50% longer before need to rest due to pain   Time 8   Period Weeks   Status On-going               Plan - 06/03/15 1237    Clinical Impression Statement Pt reports 30% overall improvement in symptoms since the start of care.  Pt is independent in HEP for flexibility and strength was added today.  Pt with continued stiffness in bilateral hips and is making progress with stretching exercises.  Pt will continue to benfit from PT for strength and flexibility progression to reduce pain.   Pt will benefit from skilled therapeutic intervention in order to improve on the following deficits Impaired flexibility;Decreased activity tolerance   Rehab Potential Good   PT Frequency 2x / week   PT  Duration 8 weeks   PT Treatment/Interventions ADLs/Self Care Home Management;Moist Heat;Electrical Stimulation;Cryotherapy;Ultrasound;Functional mobility training;Therapeutic activities;Therapeutic exercise;Manual techniques;Patient/family education;Neuromuscular re-education;Passive range of motion   PT Next Visit Plan Review HEP.  Bilateral hip flexibility and manual therapy. (Pt does not like foam roll)   Consulted and Agree with Plan of Care Patient        Problem List There are no active problems to display for this patient.   TAKACS,KELLY, PT 06/03/2015, 1:03 PM  [place] [place] [facility] [address], [place], [place], [address] Phone: [phone]   Fax:  [phone]

## 2015-06-06 ENCOUNTER — Encounter: Payer: Medicare Other | Admitting: Physical Therapy

## 2015-06-10 ENCOUNTER — Ambulatory Visit: Payer: Medicare Other | Attending: Family Medicine

## 2015-06-10 DIAGNOSIS — M25659 Stiffness of unspecified hip, not elsewhere classified: Secondary | ICD-10-CM

## 2015-06-10 DIAGNOSIS — M25559 Pain in unspecified hip: Secondary | ICD-10-CM | POA: Diagnosis not present

## 2015-06-10 NOTE — Therapy (Signed)
[facility] [address] [place], [place], [address] Phone: [phone]   Fax:  [phone]  Physical Therapy Treatment  Patient Details  Name: Glen Bolton MRN: [id number] Date of Birth: 04/04/1941 Referring Provider:  Lujean Amel, MD  Encounter Date: 06/10/2015      PT End of Session - 06/10/15 1138    Visit Number 8   Number of Visits 10   Date for PT Re-Evaluation 07/03/15   PT Start Time 1058   PT Stop Time 1152   PT Time Calculation (min) 54 min   Activity Tolerance Patient tolerated treatment well   Behavior During Therapy [facility] for tasks assessed/performed      Past Medical History  Diagnosis Date  . Hypertension   . GERD (gastroesophageal reflux disease)     Past Surgical History  Procedure Laterality Date  . Eye surgery    . Rotator cuff surgery Right 2013    There were no vitals filed for this visit.  Visit Diagnosis:  Stiffness of hip joint, unspecified laterality  Hip pain, unspecified laterality      Subjective Assessment - 06/10/15 1100    Subjective Feeling good.  Doing exercises at home.     Currently in Pain? No/denies                         [facility] Adult PT Treatment/Exercise - 06/10/15 0001    Knee/Hip Exercises: Stretches   Active Hamstring Stretch Both;3 reps;20 seconds  using steps and with strap   Other Knee/Hip Stretches Lower trunk rotation AROM 20x    Knee/Hip Exercises: Aerobic   Nustep Level 3 x 10 minutes  seat 9, arms 9   Knee/Hip Exercises: Standing   Hip Flexion Stengthening;Both;2 sets;10 reps   Hip Abduction Stengthening;Both;2 sets;10 reps   Hip Extension Stengthening;Both;2 sets;10 reps   Rebounder weight shifting 3 ways x 1 minute each   Cryotherapy   Number Minutes Cryotherapy 10 Minutes   Cryotherapy Location Hip   Type of Cryotherapy Ice pack   Manual Therapy   Manual Therapy Soft tissue mobilization  Bil greater trochanter  area with spikey roller in sidelying                  PT Short Term Goals - 06/10/15 1107    PT SHORT TERM GOAL #2   Title report 30% reduction in bil. hip pain with standing and walking   Time 4   Period Weeks   Status On-going           PT Long Term Goals - 06/10/15 1107    PT LONG TERM GOAL #3   Title report a 60% reduction in bilateral hip pain with standing and walking   Time 8   Period Weeks   Status On-going               Plan - 06/10/15 1107    Clinical Impression Statement Pt reports 30% overall improvement in symptoms since the start of care.  Pt is independet in HEP for flexiblity for strength.  Pt is independent in current HEP.  Pt with continued stiffness in bilateral hip and is making progress with stretching exercises.  Pt will continue to benefit from PT for strength and flexibility progression to reduce pain.   Pt will benefit from skilled therapeutic intervention in order to improve on the following deficits Impaired flexibility;Decreased activity tolerance   Rehab Potential Good  PT Frequency 2x / week   PT Duration 8 weeks   PT Treatment/Interventions ADLs/Self Care Home Management;Moist Heat;Electrical Stimulation;Cryotherapy;Ultrasound;Functional mobility training;Therapeutic activities;Therapeutic exercise;Manual techniques;Patient/family education;Neuromuscular re-education;Passive range of motion   PT Next Visit Plan Review HEP.  Bilateral hip flexibility and manual therapy. (Pt does not like foam roll)   Consulted and Agree with Plan of Care Patient        Problem List There are no active problems to display for this patient.   TAKACS,KELLY , PT  06/10/2015, 11:39 AM  [place] [facility] [address] [place], [place], [address] Phone: [phone]   Fax:  [phone]

## 2015-06-13 ENCOUNTER — Encounter: Payer: Self-pay | Admitting: Physical Therapy

## 2015-06-13 ENCOUNTER — Ambulatory Visit: Payer: Medicare Other | Admitting: Physical Therapy

## 2015-06-13 DIAGNOSIS — M25659 Stiffness of unspecified hip, not elsewhere classified: Secondary | ICD-10-CM

## 2015-06-13 DIAGNOSIS — M25559 Pain in unspecified hip: Secondary | ICD-10-CM | POA: Diagnosis not present

## 2015-06-13 NOTE — Therapy (Addendum)
[facility] [address] [place], [place], [address] Phone: [phone]   Fax:  [phone]  Physical Therapy Treatment  Patient Details  Name: Glen Bolton MRN: [id number] Date of Birth: 1940-11-14 Referring Provider:  Lujean Amel, MD  Encounter Date: 06/13/2015      PT End of Session - 06/13/15 1004    Visit Number 9   Number of Visits 10   Date for PT Re-Evaluation 07/03/15   PT Start Time 0930   PT Stop Time 1020   PT Time Calculation (min) 50 min   Activity Tolerance Patient tolerated treatment well   Behavior During Therapy [facility] for tasks assessed/performed      Past Medical History  Diagnosis Date  . Hypertension   . GERD (gastroesophageal reflux disease)     Past Surgical History  Procedure Laterality Date  . Eye surgery    . Rotator cuff surgery Right 2013    There were no vitals filed for this visit.  Visit Diagnosis:  Stiffness of hip joint, unspecified laterality  Hip pain, unspecified laterality      Subjective Assessment - 06/13/15 0931    Subjective Played golf yesterday, was sore after playing 18.  Ice helped.    Pain Score 3    Pain Location Back   Pain Orientation Right;Left;Lower   Pain Descriptors / Indicators Aching   Aggravating Factors  Too much activity   Pain Relieving Factors Streching   Multiple Pain Sites No            [facility] PT Assessment - 06/13/15 0001    Observation/Other Assessments   Focus on Therapeutic Outcomes (FOTO)  63% limitation GOal CK, Current CL                     [facility] Adult PT Treatment/Exercise - 06/13/15 0001    Knee/Hip Exercises: Stretches   Active Hamstring Stretch Both;3 reps;30 seconds  Seated   Knee/Hip Exercises: Aerobic   Nustep L3 x 10 min   Knee/Hip Exercises: Standing   Hip Flexion Stengthening;Both;2 sets;10 reps   Hip Abduction Stengthening;Both;2 sets;10 reps   Hip Extension Stengthening;Both;2 sets;10  reps   Rebounder weight shifting 3 ways x 1 minute each   Cryotherapy   Number Minutes Cryotherapy 10 Minutes   Cryotherapy Location Hip   Type of Cryotherapy Ice pack                PT Education - 06/13/15 1004    Education provided Yes   Education Details Review of HEP/self care incase today is last appt.   Person(s) Educated Patient   Methods Explanation;Demonstration   Comprehension Verbalized understanding;Returned demonstration          PT Short Term Goals - 06/13/15 0935    PT SHORT TERM GOAL #1   Title be independent in initial HEP   Time 4   Status Achieved   PT SHORT TERM GOAL #2   Title report 30% reduction in bil. hip pain with standing and walking   Time 4   Period Weeks   Status On-going  10%           PT Long Term Goals - 06/13/15 0935    PT LONG TERM GOAL #1   Title be independent in advanced HEP   Time 8   Period Weeks   Status On-going   PT LONG TERM GOAL #2   Title reduce FOTO to < or = to 47%  limitaiton   Time 8   Period Weeks   Status Not Met  63% limitation   PT LONG TERM GOAL #4   Title stand and walk 50% longer before need to rest due to pain   Time 8   Period Weeks   Status On-going  8%-10% per pt               Plan - 07-12-15 0942    Clinical Impression Statement Pt reporting about the same amount of improvement as last week. At this point he would like to put PT on hold and see MD to discuss hips/back and knees. He reports he may want to finish his current corse of care. No new goals are met and FOTO is essentially the same as on eval.    Pt will benefit from skilled therapeutic intervention in order to improve on the following deficits Impaired flexibility;Decreased activity tolerance   Rehab Potential Good   PT Frequency 2x / week   PT Duration 8 weeks   PT Treatment/Interventions ADLs/Self Care Home Management;Moist Heat;Electrical Stimulation;Cryotherapy;Ultrasound;Functional mobility training;Therapeutic  activities;Therapeutic exercise;Manual techniques;Patient/family education;Neuromuscular re-education;Passive range of motion   PT Next Visit Plan See what MD says: DC plan or DC    Consulted and Agree with Plan of Care Patient        Problem List There are no active problems to display for this patient.   COCHRAN,JENNIFER, PTA 07/12/2015, 10:07 AM G-codes:  Mobility Category:  Goal: CK D/C: CL  PHYSICAL THERAPY DISCHARGE SUMMARY  Visits from Start of Care: 9  Current functional level related to goals / functional outcomes: See above for current status.     Remaining deficits: Continued hip pain.  10% improved since the start of care.     Education / Equipment: HEP Plan: Patient agrees to discharge.  Patient goals were partially met. Patient is being discharged due to not returning since the last visit.  ?????   Sigurd Sos, PT 06/26/2015 2:46 PM  [place] [facility] [address] [place], [place], [address] Phone: [phone]   Fax:  [phone]

## 2015-07-01 DIAGNOSIS — H6532 Chronic mucoid otitis media, left ear: Secondary | ICD-10-CM | POA: Diagnosis not present

## 2015-07-08 DIAGNOSIS — H401133 Primary open-angle glaucoma, bilateral, severe stage: Secondary | ICD-10-CM | POA: Diagnosis not present

## 2015-07-08 DIAGNOSIS — H43812 Vitreous degeneration, left eye: Secondary | ICD-10-CM | POA: Diagnosis not present

## 2015-07-09 DIAGNOSIS — Z23 Encounter for immunization: Secondary | ICD-10-CM | POA: Diagnosis not present

## 2015-07-15 DIAGNOSIS — M170 Bilateral primary osteoarthritis of knee: Secondary | ICD-10-CM | POA: Diagnosis not present

## 2015-07-21 DIAGNOSIS — M4692 Unspecified inflammatory spondylopathy, cervical region: Secondary | ICD-10-CM | POA: Diagnosis not present

## 2015-07-21 DIAGNOSIS — M542 Cervicalgia: Secondary | ICD-10-CM | POA: Diagnosis not present

## 2015-07-22 DIAGNOSIS — M542 Cervicalgia: Secondary | ICD-10-CM | POA: Diagnosis not present

## 2015-07-23 ENCOUNTER — Encounter: Payer: Self-pay | Admitting: Podiatry

## 2015-07-23 ENCOUNTER — Ambulatory Visit (INDEPENDENT_AMBULATORY_CARE_PROVIDER_SITE_OTHER): Payer: Medicare Other | Admitting: Podiatry

## 2015-07-23 DIAGNOSIS — B351 Tinea unguium: Secondary | ICD-10-CM | POA: Diagnosis not present

## 2015-07-23 DIAGNOSIS — M79676 Pain in unspecified toe(s): Secondary | ICD-10-CM

## 2015-07-23 NOTE — Addendum Note (Signed)
Addended by: Ezzard Flax, Kadiatou Oplinger L on: 07/23/2015 10:13 AM   Modules accepted: Medications

## 2015-07-23 NOTE — Progress Notes (Signed)
Subjective:     Patient ID: Glen Bolton, male   DOB: [birth year], 74 y.o.   MRN: [id number]  HPIThis patient presents to office fir nail care of both feet.  He previously was seen by Dr. Gershon Mussel who performed an I & D medial border left big toe.  He says the pain and redness has resolved and he now has black tissue in nail groove.  The hallux nails are painful from pressure. Today he presents for pain along the inside border right big toe.  There is redness and swelling at the tip of his big toe right foot.   Review of Systems     Objective:   Physical Exam GENERAL APPEARANCE: Alert, conversant. Appropriately groomed. No acute distress.  VASCULAR: Pedal pulses palpable at  [facility] and PT bilateral.  Capillary refill time is immediate to all digits,  Normal temperature gradient.  Digital hair growth is present bilateral  NEUROLOGIC: sensation is normal to 5.07 monofilament at 5/5 sites bilateral.  Light touch is intact bilateral, Muscle strength normal.  MUSCULOSKELETAL: acceptable muscle strength, tone and stability bilateral.  Intrinsic muscluature intact bilateral.  Rectus appearance of foot and digits noted bilateral.  Asymptomatic HAV B/L  DERMATOLOGIC: skin color, texture, and turgor are within normal limits.  No preulcerative lesions or ulcers  are seen, no interdigital maceration noted.  No open lesions present.  . No drainage noted. NAILS  Thick disfigured incurvated nails both big toes. Pain redness and swelling along medial border right big toe..      Assessment:     Onychomycosis Ingrown toenail right hallux.    Plan:     IE  Debride nails.  RTC To schedule nail surgery in January in this office.  Gardiner Barefoot DPM.

## 2015-07-28 DIAGNOSIS — M542 Cervicalgia: Secondary | ICD-10-CM | POA: Diagnosis not present

## 2015-07-30 DIAGNOSIS — M542 Cervicalgia: Secondary | ICD-10-CM | POA: Diagnosis not present

## 2015-08-04 DIAGNOSIS — M542 Cervicalgia: Secondary | ICD-10-CM | POA: Diagnosis not present

## 2015-08-11 DIAGNOSIS — M542 Cervicalgia: Secondary | ICD-10-CM | POA: Diagnosis not present

## 2015-08-18 DIAGNOSIS — M542 Cervicalgia: Secondary | ICD-10-CM | POA: Diagnosis not present

## 2015-08-18 DIAGNOSIS — I499 Cardiac arrhythmia, unspecified: Secondary | ICD-10-CM | POA: Diagnosis not present

## 2015-08-18 DIAGNOSIS — I4891 Unspecified atrial fibrillation: Secondary | ICD-10-CM | POA: Diagnosis not present

## 2015-08-18 DIAGNOSIS — I10 Essential (primary) hypertension: Secondary | ICD-10-CM | POA: Diagnosis not present

## 2015-08-18 DIAGNOSIS — Q670 Congenital facial asymmetry: Secondary | ICD-10-CM | POA: Diagnosis not present

## 2015-08-18 DIAGNOSIS — R200 Anesthesia of skin: Secondary | ICD-10-CM | POA: Diagnosis not present

## 2015-08-19 ENCOUNTER — Other Ambulatory Visit: Payer: Self-pay | Admitting: Family Medicine

## 2015-08-19 ENCOUNTER — Telehealth: Payer: Self-pay | Admitting: Cardiovascular Disease

## 2015-08-19 DIAGNOSIS — Q670 Congenital facial asymmetry: Secondary | ICD-10-CM

## 2015-08-19 NOTE — Telephone Encounter (Signed)
Received records from [place] for appointment on 09/10/15 with Dr Oval Linsey.  Records given to [facility] (medical records) for Dr Blenda Mounts schedule on 09/10/15. lp

## 2015-08-25 DIAGNOSIS — M542 Cervicalgia: Secondary | ICD-10-CM | POA: Diagnosis not present

## 2015-08-26 ENCOUNTER — Ambulatory Visit (INDEPENDENT_AMBULATORY_CARE_PROVIDER_SITE_OTHER): Payer: Medicare Other | Admitting: Cardiology

## 2015-08-26 ENCOUNTER — Encounter: Payer: Self-pay | Admitting: Cardiology

## 2015-08-26 VITALS — BP 130/74 | HR 91 | Ht 69.5 in | Wt 259.3 lb

## 2015-08-26 DIAGNOSIS — R5383 Other fatigue: Secondary | ICD-10-CM

## 2015-08-26 DIAGNOSIS — G4730 Sleep apnea, unspecified: Secondary | ICD-10-CM

## 2015-08-26 DIAGNOSIS — I482 Chronic atrial fibrillation, unspecified: Secondary | ICD-10-CM

## 2015-08-26 DIAGNOSIS — R0683 Snoring: Secondary | ICD-10-CM

## 2015-08-26 DIAGNOSIS — I481 Persistent atrial fibrillation: Secondary | ICD-10-CM

## 2015-08-26 DIAGNOSIS — I4819 Other persistent atrial fibrillation: Secondary | ICD-10-CM

## 2015-08-26 HISTORY — DX: Chronic atrial fibrillation, unspecified: I48.20

## 2015-08-26 LAB — TSH: TSH: 0.513 u[IU]/mL (ref 0.350–4.500)

## 2015-08-26 NOTE — Progress Notes (Addendum)
Cardiology Office Note   Date:  08/26/2015   ID:  Glen Bolton, DOB 1940-09-28, MRN [id number]  PCP:  No PCP Per Patient  Cardiologist:   Minus Breeding, MD   Chief Complaint  Patient presents with  . Atrial Fibrillation      History of Present Illness: Glen Bolton is a 74 y.o. male who presents for evaluation of atrial fibrillation. He was noted to have this recently when he presented to his doctor for management of hypertension. He's had hypertensive urgency before it difficult to control blood pressures. He was living in [place]. One night hishis heart rate was up and pounding.   He felt like he had a headache. This was similar to previous hypertensive urgencies. He has a blood pressure cuff that is not accurate in terms he said his blood pressure was 230 he didn't believe this. He took some aspirin and his symptoms resolved. A couple of days later he went to his primary care physician in his blood pressure was controlled but he was in atrial fibrillation with a rate in the 70s. I do not have this EKG but I do have office records which I have reviewed. He said he's had an irregular heartbeat over the years but never been diagnosed with fibrillation or any other cardiac problems. He denies any chest pressure, neck or arm discomfort. He denies any presyncope or syncope. He has no shortness of breath , PND or orthopnea. He's had no weight gain or edema. He does have fatigue , hypersomnolence and snoring. His wife says he has apneic episodes. He said he had mild sleep apnea years ago diagnosed with the treatment.   Past Medical History  Diagnosis Date  . Hypertension   . GERD (gastroesophageal reflux disease)   . Glaucoma     Past Surgical History  Procedure Laterality Date  . Cataract extraction Bilateral   . Rotator cuff surgery Right 2013  . Inguinal hernia repair Left     x 2     Current Outpatient Prescriptions  Medication Sig Dispense Refill  . amLODipine-benazepril  (LOTREL) 10-20 MG per capsule Take 1 capsule by mouth daily.    Marland Kitchen apixaban (ELIQUIS) 5 MG TABS tablet Take 5 mg by mouth 2 (two) times daily.    . bimatoprost (LUMIGAN) 0.01 % SOLN 1 drop at bedtime.    Marland Kitchen BIOGAIA PROBIOTIC (BIOGAIA PROBIOTIC) LIQD Take by mouth daily at 8 pm.    . dorzolamide (TRUSOPT) 2 % ophthalmic solution 1 drop 3 (three) times daily.    Marland Kitchen losartan (COZAAR) 50 MG tablet Take 50 mg by mouth daily.    . methocarbamol (ROBAXIN) 500 MG tablet TAKE 1 TABLET BY MOUTH EVERY 6 HOURS AS NEEDED FOR SPASMS  0  . omeprazole (PRILOSEC) 20 MG capsule Take 20 mg by mouth daily.     No current facility-administered medications for this visit.    Allergies:   Review of patient's allergies indicates no known allergies.    Social History:  The patient  reports that he has never smoked. He has never used smokeless tobacco.   Family History:  The patient's family history includes Hyperlipidemia in his brother; Stroke (age of onset: 60) in his sister.    ROS:  Please see the history of present illness.   Otherwise, review of systems are positive for  Snoring.   All other systems are reviewed and negative.    PHYSICAL EXAM: VS:  BP 130/74 mmHg  Pulse 91  Ht  5' 9.5" (1.765 m)  Wt 259 lb 4.8 oz (117.618 kg)  BMI 37.76 kg/m2  SpO2 95% , BMI Body mass index is 37.76 kg/(m^2). GENERAL:  Well appearing HEENT:  Pupils equal round and reactive, fundi not visualized, oral mucosa unremarkable NECK:  No jugular venous distention, waveform within normal limits, carotid upstroke brisk and symmetric, no bruits, no thyromegaly LYMPHATICS:  No cervical, inguinal adenopathy LUNGS:  Clear to auscultation bilaterally BACK:  No CVA tenderness CHEST:  Unremarkable HEART:  PMI not displaced or sustained,S1 and S2 within normal limits, no S3, no S4, no clicks, no rubs, no murmurs, irregular ABD:  Flat, positive bowel sounds normal in frequency in pitch, no bruits, no rebound, no guarding, no midline  pulsatile mass, no hepatomegaly, no splenomegaly EXT:  2 plus pulses throughout, no edema, no cyanosis no clubbing SKIN:  No rashes no nodules NEURO:  Cranial nerves II through XII grossly intact, motor grossly intact throughout PSYCH:  Cognitively intact, oriented to person place and time    EKG:  EKG is ordered today. The ekg ordered today demonstrates atrial fibrillation, rate 93, right axis deviation, premature ectopic complex, no acute ST-T wave changes.   Recent Labs: No results found for requested labs within last 365 days.    Lipid Panel No results found for: CHOL, TRIG, HDL, CHOLHDL, VLDL, LDLCALC, LDLDIRECT    Wt Readings from Last 3 Encounters:  08/26/15 259 lb 4.8 oz (117.618 kg)      Other studies Reviewed: Additional studies/ records that were reviewed today include: Outside records and ECG. Review of the above records demonstrates:  Please see elsewhere in the note.     ASSESSMENT AND PLAN:  ATRIAL FIB:   I will start with a TSH and an echocardiogram. I will get a 24-hour Holter to make sure this is persistent atrial fib rate control. He is on the appropriate dose of Eliquis.   I will eventually consider cardioversion. However, I will defer this as he is thinking of getting orthopedic surgery next month.   We discussed risks benefits of anticoagulation. Glen Bolton has a CHA2DS2 - VASc score of 2 with a risk of stroke of 2.2%.    SLEEP APNEA:  His Epworth scale is 12.  He will have a sleep study.  OBESITY:  We will discuss exercise and diet.  Current medicines are reviewed at length with the patient today.  The patient does not have concerns regarding medicines.  The following changes have been made:  As above.   Labs/ tests ordered today include:   Orders Placed This Encounter  Procedures  . TSH  . Holter monitor - 24 hour  . ECHOCARDIOGRAM COMPLETE  . Split night study     Disposition:   FU with me after the studies.      Signed, Minus Breeding, MD  08/26/2015 11:45 AM    [name]

## 2015-08-26 NOTE — Patient Instructions (Signed)
Your physician recommends that you schedule a follow-up appointment in: After studies  Your physician has requested that you have an echocardiogram. Echocardiography is a painless test that uses sound waves to create images of your heart. It provides your doctor with information about the size and shape of your heart and how well your heart's chambers and valves are working. This procedure takes approximately one hour. There are no restrictions for this procedure.  Your physician has recommended that you have a sleep study. This test records several body functions during sleep, including: brain activity, eye movement, oxygen and carbon dioxide blood levels, heart rate and rhythm, breathing rate and rhythm, the flow of air through your mouth and nose, snoring, body muscle movements, and chest and belly movement.  Your physician recommends that you return for lab work in: [facility]  Your physician has recommended that you wear a 24 hour holter monitor. Holter monitors are medical devices that record the heart's electrical activity. Doctors most often use these monitors to diagnose arrhythmias. Arrhythmias are problems with the speed or rhythm of the heartbeat. The monitor is a small, portable device. You can wear one while you do your normal daily activities. This is usually used to diagnose what is causing palpitations/syncope (passing out).  Merry Christmas and Happy New Year!!

## 2015-08-27 ENCOUNTER — Ambulatory Visit (INDEPENDENT_AMBULATORY_CARE_PROVIDER_SITE_OTHER): Payer: Medicare Other

## 2015-08-27 DIAGNOSIS — I481 Persistent atrial fibrillation: Secondary | ICD-10-CM

## 2015-08-27 DIAGNOSIS — I4819 Other persistent atrial fibrillation: Secondary | ICD-10-CM

## 2015-09-02 DIAGNOSIS — M542 Cervicalgia: Secondary | ICD-10-CM | POA: Diagnosis not present

## 2015-09-09 ENCOUNTER — Ambulatory Visit
Admission: RE | Admit: 2015-09-09 | Discharge: 2015-09-09 | Disposition: A | Discharge status: Home / Self Care | Payer: Medicare Other | Source: Ambulatory Visit | Attending: Family Medicine | Admitting: Family Medicine

## 2015-09-09 DIAGNOSIS — R269 Unspecified abnormalities of gait and mobility: Secondary | ICD-10-CM | POA: Diagnosis not present

## 2015-09-09 DIAGNOSIS — R200 Anesthesia of skin: Secondary | ICD-10-CM | POA: Diagnosis not present

## 2015-09-09 DIAGNOSIS — Q670 Congenital facial asymmetry: Secondary | ICD-10-CM

## 2015-09-09 MED ORDER — GADOBENATE DIMEGLUMINE 529 MG/ML IV SOLN
20.0000 mL | Freq: Once | INTRAVENOUS | Status: AC | PRN
  Administered 2015-09-09: 20 mL via INTRAVENOUS

## 2015-09-10 ENCOUNTER — Ambulatory Visit: Payer: Medicare Other | Admitting: Cardiovascular Disease

## 2015-09-10 ENCOUNTER — Ambulatory Visit (HOSPITAL_COMMUNITY): Payer: Medicare Other | Attending: Cardiology

## 2015-09-10 ENCOUNTER — Other Ambulatory Visit: Payer: Self-pay

## 2015-09-10 DIAGNOSIS — I10 Essential (primary) hypertension: Secondary | ICD-10-CM | POA: Diagnosis not present

## 2015-09-10 DIAGNOSIS — K219 Gastro-esophageal reflux disease without esophagitis: Secondary | ICD-10-CM | POA: Insufficient documentation

## 2015-09-10 DIAGNOSIS — I481 Persistent atrial fibrillation: Secondary | ICD-10-CM | POA: Insufficient documentation

## 2015-09-10 DIAGNOSIS — H409 Unspecified glaucoma: Secondary | ICD-10-CM | POA: Diagnosis not present

## 2015-09-10 DIAGNOSIS — I4819 Other persistent atrial fibrillation: Secondary | ICD-10-CM

## 2015-09-10 DIAGNOSIS — I358 Other nonrheumatic aortic valve disorders: Secondary | ICD-10-CM | POA: Diagnosis not present

## 2015-09-10 DIAGNOSIS — I4891 Unspecified atrial fibrillation: Secondary | ICD-10-CM | POA: Diagnosis present

## 2015-09-10 DIAGNOSIS — I517 Cardiomegaly: Secondary | ICD-10-CM | POA: Diagnosis not present

## 2015-09-11 ENCOUNTER — Ambulatory Visit (INDEPENDENT_AMBULATORY_CARE_PROVIDER_SITE_OTHER): Payer: Medicare Other | Admitting: Podiatry

## 2015-09-11 ENCOUNTER — Encounter: Payer: Self-pay | Admitting: Podiatry

## 2015-09-11 VITALS — BP 122/81 | HR 75 | Resp 16

## 2015-09-11 DIAGNOSIS — B351 Tinea unguium: Secondary | ICD-10-CM | POA: Diagnosis not present

## 2015-09-11 DIAGNOSIS — M79676 Pain in unspecified toe(s): Secondary | ICD-10-CM

## 2015-09-11 NOTE — Progress Notes (Signed)
Subjective:     Patient ID: Glen Bolton, male   DOB: 11-28-1940, 75 y.o.   MRN: [id number]  HPIhis patient returns to the office follow-up for nail surgery on his right big toe. He had an I&D performed by Dr. Gershon Mussel about 3 months ago. He was then seen in my office for nail care are in November. At that time we decided to have him return in January to evaluate the need for possible permanent nail surgery.  He says he has been golfing and has had no pain since his November visit. He presents the office for an evaluation and treatment   Review of Systems     Objective:   Physical Exam GENERAL APPEARANCE: Alert, conversant. Appropriately groomed. No acute distress.  VASCULAR: Pedal pulses palpable at  [facility] and PT bilateral.  Capillary refill time is immediate to all digits,  Normal temperature gradient.  Digital hair growth is present bilateral  NEUROLOGIC: sensation is normal to 5.07 monofilament at 5/5 sites bilateral.  Light touch is intact bilateral, Muscle strength normal.  MUSCULOSKELETAL: acceptable muscle strength, tone and stability bilateral.  Intrinsic muscluature intact bilateral.  Rectus appearance of foot and digits noted bilateral.   DERMATOLOGIC: skin color, texture, and turgor are within normal limits.  No preulcerative lesions or ulcers  are seen, no interdigital maceration noted.  No open lesions present.  Digital nails are asymptomatic. No drainage noted.      Assessment:     Onychomycosis right hallux     Plan:     Debride right hallux nail.  After discussion, we decided to postpone his surgery until pain returns.  RTC 3 months   Gardiner Barefoot DPM

## 2015-09-18 NOTE — Addendum Note (Signed)
Addended by: Therisa Doyne on: 09/18/2015 07:49 AM   Modules accepted: Orders

## 2015-09-25 ENCOUNTER — Telehealth: Payer: Self-pay | Admitting: Cardiology

## 2015-09-25 MED ORDER — METOPROLOL SUCCINATE ER 50 MG PO TB24: 50.0000 mg | ORAL_TABLET | Freq: Every day | ORAL | Status: DC

## 2015-09-25 NOTE — Telephone Encounter (Signed)
Pt aware that as I can review, Dr. Percival Spanish made no recommendations for medication adjustments based on last echo results. Will route to Pam to verify.

## 2015-09-25 NOTE — Telephone Encounter (Signed)
Pt aware of results of holter monitor, to d/c losartan and start Metoprolol XL 50 mg a day to better control his HR.

## 2015-09-25 NOTE — Telephone Encounter (Addendum)
New message       Pt request to talk to the nurse.  He was listening to his vm about his echo results and the nurse said something about a change in his medication.  He does not understand what the change in medication is all about.  Please call

## 2015-09-26 ENCOUNTER — Other Ambulatory Visit: Payer: Self-pay | Admitting: Orthopedic Surgery

## 2015-09-30 ENCOUNTER — Ambulatory Visit (INDEPENDENT_AMBULATORY_CARE_PROVIDER_SITE_OTHER): Payer: Medicare Other | Admitting: Cardiology

## 2015-09-30 ENCOUNTER — Encounter: Payer: Self-pay | Admitting: Cardiology

## 2015-09-30 VITALS — BP 130/84 | HR 66 | Ht 70.0 in | Wt 263.1 lb

## 2015-09-30 DIAGNOSIS — I481 Persistent atrial fibrillation: Secondary | ICD-10-CM

## 2015-09-30 DIAGNOSIS — I4819 Other persistent atrial fibrillation: Secondary | ICD-10-CM

## 2015-09-30 NOTE — Patient Instructions (Signed)
You have been cleared for surgery!  Dr Percival Spanish recommends that you schedule a follow-up appointment in 6 months. You will receive a reminder letter in the mail two months in advance. If you don't receive a letter, please call our office to schedule the follow-up appointment.  If you need a refill on your cardiac medications before your next appointment, please call your pharmacy.

## 2015-09-30 NOTE — Progress Notes (Signed)
Cardiology Office Note   Date:  09/30/2015   ID:  Glen Bolton, DOB 1941/01/23, MRN [id number]  PCP:  Glen Amel, MD  Cardiologist:   Glen Breeding, MD   No chief complaint on file.     History of Present Illness: Glen Bolton is a 75 y.o. male who presents for evaluation of atrial fibrillation. He was seen by me for this and was in atrial fibrillation at the last visit. A monitor demonstrated that he had some rapid rates and I stopped Cozaar and increase metoprolol. I was also setting him up for sleep study as he clearly has sleep apnea. He otherwise really doesn't notice his tachypalpitations. He was started on anticoagulation. He's going to have orthopedic surgery.  The patient denies any new symptoms such as chest discomfort, neck or arm discomfort. There has been no new shortness of breath, PND or orthopnea. There have been no reported palpitations, presyncope or syncope.  He has had some chronic dyspnea doing activities such as walking up an incline but he can do his usual activities without bringing on any new symptoms. He has no anginal equivalent.  Of note he did have an echocardiogram which was essentially unremarkable as well.  TSH was normal.  Past Medical History  Diagnosis Date  . Hypertension   . GERD (gastroesophageal reflux disease)   . Glaucoma     Past Surgical History  Procedure Laterality Date  . Cataract extraction Bilateral   . Rotator cuff surgery Right 2013  . Inguinal hernia repair Left     x 2     Current Outpatient Prescriptions  Medication Sig Dispense Refill  . amLODipine-benazepril (LOTREL) 10-20 MG per capsule Take 1 capsule by mouth daily.    Marland Kitchen apixaban (ELIQUIS) 5 MG TABS tablet Take 5 mg by mouth 2 (two) times daily.    . bimatoprost (LUMIGAN) 0.01 % SOLN 1 drop at bedtime.    Marland Kitchen BIOGAIA PROBIOTIC (BIOGAIA PROBIOTIC) LIQD Take by mouth daily at 8 pm.    . dorzolamide (TRUSOPT) 2 % ophthalmic solution 1 drop 3 (three) times daily.    .  methocarbamol (ROBAXIN) 500 MG tablet TAKE 1 TABLET BY MOUTH EVERY 6 HOURS AS NEEDED FOR SPASMS  0  . metoprolol succinate (TOPROL-XL) 50 MG 24 hr tablet Take 1 tablet (50 mg total) by mouth daily. Take with or immediately following a meal. 90 tablet 3  . omeprazole (PRILOSEC) 20 MG capsule Take 20 mg by mouth daily.     No current facility-administered medications for this visit.    Allergies:   Review of patient's allergies indicates no known allergies.     ROS:  Please see the history of present illness.   Otherwise, review of systems are positive for snoring.   All other systems are reviewed and negative.    PHYSICAL EXAM: VS:  BP 130/84 mmHg  Pulse 66  Ht 5\' 10"  (1.778 m)  Wt 263 lb 1.6 oz (119.341 kg)  BMI 37.75 kg/m2 , BMI Body mass index is 37.75 kg/(m^2). GENERAL:  Well appearing NECK:  No jugular venous distention, waveform within normal limits, carotid upstroke brisk and symmetric, no bruits, no thyromegaly LYMPHATICS:  No cervical, inguinal adenopathy LUNGS:  Clear to auscultation bilaterally BACK:  No CVA tenderness CHEST:  Unremarkable HEART:  PMI not displaced or sustained,S1 and S2 within normal limits, no S3, no S4, no clicks, no rubs, no murmurs, irregular ABD:  Flat, positive bowel sounds normal in frequency in pitch, no bruits,  no rebound, no guarding, no midline pulsatile mass, no hepatomegaly, no splenomegaly EXT:  2 plus pulses throughout, mild edema, no cyanosis no clubbing SKIN:  No rashes no nodules    EKG:  EKG is not ordered today.   Recent Labs: 08/26/2015: TSH 0.513    Lipid Panel No results found for: CHOL, TRIG, HDL, CHOLHDL, VLDL, LDLCALC, LDLDIRECT    Wt Readings from Last 3 Encounters:  09/30/15 263 lb 1.6 oz (119.341 kg)  08/26/15 259 lb 4.8 oz (117.618 kg)      Other studies Reviewed: Additional studies/ records that were reviewed today include: Outside records and ECG. Review of the above records demonstrates:  Please see  elsewhere in the note.     ASSESSMENT AND PLAN:  ATRIAL FIB:    [name] has a CHA2DS2 - VASc score of 2 with a risk of stroke of 2.2%.    He can hold his Glen Bolton was for his surgery. No bridging as needed. I did adjust his meds for rate control and this will be further evaluated at the time of surgery and with a sleep study.  SLEEP APNEA:  His Epworth scale is 12.  He will have a sleep study and this has been scheduled.  OBESITY:  We will discuss exercise and diet particularly after he has had his knee surgery.  PREOP:  The patientdoes have a high functional level. This is not a high risk surgery from a cardiovascular standpoint.  Therefore, based on ACC/AHA guidelines, the patient would be at acceptable risk for the planned procedure without further cardiovascular testing.  Current medicines are reviewed at length with the patient today.  The patient does not have concerns regarding medicines.  The following changes have been made:  As above  Labs/ tests ordered today include:   No orders of the defined types were placed in this encounter.     Disposition:   FU with me in six months.     Signed, Glen Breeding, MD  09/30/2015 9:51 AM    [facility]

## 2015-10-16 ENCOUNTER — Ambulatory Visit (HOSPITAL_COMMUNITY)
Admission: RE | Admit: 2015-10-16 | Discharge: 2015-10-16 | Disposition: A | Discharge status: Home / Self Care | Payer: Medicare Other | Source: Ambulatory Visit | Attending: Orthopedic Surgery | Admitting: Orthopedic Surgery

## 2015-10-16 ENCOUNTER — Encounter (HOSPITAL_COMMUNITY): Payer: Self-pay

## 2015-10-16 ENCOUNTER — Encounter (HOSPITAL_COMMUNITY)
Admission: RE | Admit: 2015-10-16 | Discharge: 2015-10-16 | Disposition: A | Discharge status: Home / Self Care | Payer: Medicare Other | Source: Ambulatory Visit | Attending: Orthopedic Surgery | Admitting: Orthopedic Surgery

## 2015-10-16 DIAGNOSIS — M1612 Unilateral primary osteoarthritis, left hip: Secondary | ICD-10-CM | POA: Insufficient documentation

## 2015-10-16 DIAGNOSIS — Z01812 Encounter for preprocedural laboratory examination: Secondary | ICD-10-CM | POA: Diagnosis not present

## 2015-10-16 DIAGNOSIS — Z0183 Encounter for blood typing: Secondary | ICD-10-CM | POA: Diagnosis not present

## 2015-10-16 DIAGNOSIS — Z01818 Encounter for other preprocedural examination: Secondary | ICD-10-CM | POA: Insufficient documentation

## 2015-10-16 HISTORY — DX: Personal history of other diseases of the nervous system and sense organs: Z86.69

## 2015-10-16 HISTORY — DX: Personal history of urinary calculi: Z87.442

## 2015-10-16 HISTORY — DX: Unspecified osteoarthritis, unspecified site: M19.90

## 2015-10-16 HISTORY — DX: Unspecified atrial fibrillation: I48.91

## 2015-10-16 HISTORY — DX: Personal history of peptic ulcer disease: Z87.11

## 2015-10-16 HISTORY — DX: Unspecified hearing loss, unspecified ear: H91.90

## 2015-10-16 HISTORY — DX: Personal history of other diseases of the digestive system: Z87.19

## 2015-10-16 HISTORY — DX: Cardiac arrhythmia, unspecified: I49.9

## 2015-10-16 LAB — CBC WITH DIFFERENTIAL/PLATELET
Basophils Absolute: 0 K/uL (ref 0.0–0.1)
Basophils Relative: 0 %
Eosinophils Absolute: 0.2 K/uL (ref 0.0–0.7)
Eosinophils Relative: 3 %
HCT: 45 % (ref 39.0–52.0)
Hemoglobin: 15.2 g/dL (ref 13.0–17.0)
Lymphocytes Relative: 24 %
Lymphs Abs: 1.3 K/uL (ref 0.7–4.0)
MCH: 30.3 pg (ref 26.0–34.0)
MCHC: 33.8 g/dL (ref 30.0–36.0)
MCV: 89.6 fL (ref 78.0–100.0)
Monocytes Absolute: 0.8 K/uL (ref 0.1–1.0)
Monocytes Relative: 14 %
Neutro Abs: 3.2 K/uL (ref 1.7–7.7)
Neutrophils Relative %: 59 %
Platelets: 165 K/uL (ref 150–400)
RBC: 5.02 MIL/uL (ref 4.22–5.81)
RDW: 13.4 % (ref 11.5–15.5)
WBC: 5.5 K/uL (ref 4.0–10.5)

## 2015-10-16 LAB — BASIC METABOLIC PANEL
Anion gap: 8 (ref 5–15)
BUN: 17 mg/dL (ref 6–20)
CALCIUM: 9.3 mg/dL (ref 8.9–10.3)
CHLORIDE: 109 mmol/L (ref 101–111)
CO2: 24 mmol/L (ref 22–32)
CREATININE: 0.78 mg/dL (ref 0.61–1.24)
GFR calc Af Amer: >60 mL/min (ref >60)
GFR calc non Af Amer: >60 mL/min (ref >60)
Glucose, Bld: 98 mg/dL (ref 65–99)
Potassium: 4 mmol/L (ref 3.5–5.1)
SODIUM: 141 mmol/L (ref 135–145)

## 2015-10-16 LAB — URINALYSIS, ROUTINE W REFLEX MICROSCOPIC
Bilirubin Urine: NEGATIVE
GLUCOSE, UA: NEGATIVE mg/dL
HGB URINE DIPSTICK: NEGATIVE
Ketones, ur: NEGATIVE mg/dL
Nitrite: NEGATIVE
Protein, ur: NEGATIVE mg/dL
SPECIFIC GRAVITY, URINE: 1.023 (ref 1.005–1.030)
pH: 5.5 (ref 5.0–8.0)

## 2015-10-16 LAB — URINE MICROSCOPIC-ADD ON
RBC / HPF: NONE SEEN RBC/hpf (ref 0–5)
Squamous Epithelial / HPF: NONE SEEN

## 2015-10-16 LAB — TYPE AND SCREEN
ABO/RH(D): A POS
ANTIBODY SCREEN: NEGATIVE

## 2015-10-16 LAB — SURGICAL PCR SCREEN
MRSA, PCR: NEGATIVE
Staphylococcus aureus: NEGATIVE

## 2015-10-16 LAB — ABO/RH: ABO/RH(D): A POS

## 2015-10-16 NOTE — Progress Notes (Signed)
PCP - Dr. Lujean Amel at [place] - Dr. Percival Spanish  EKG - 08/26/15 CXR - 10/16/15  Echo- 09/2015 Stress test/cardiac cath - denies  Patient denies chest pain and shortness of breath at PAT appointment.  Patient states that he was instructed to take his last dose of Eliquis on 10/24/15.  PT-INR and PTT will need to be drawn DOS.    Cardiac clearance on chart in note from 09/30/15.

## 2015-10-16 NOTE — Pre-Procedure Instructions (Signed)
    Glen Bolton  10/16/2015      [facility], [place] [address] Phone: [phone] Fax: [phone]    Your procedure is scheduled on Monday, February 20th, 2017.  Report to [facility] Admitting at 5:30 A.M.   Call this number if you have problems the morning of surgery:  [phone]   Remember:  Do not eat food or drink liquids after midnight.   Take these medicines the morning of surgery with A SIP OF WATER: Amlodipine-benazepril (Lotrel), Trusopt eye drops, Methocarbamol (Robaxin) if needed, Metoprolol Succinate (Toprol-XL0, Omeprazole (Prilosec).  Follow your MD's instructions on stopping your Eliquis - last dose on Friday, February 17th.   7 days prior to surgery, stop taking: Aspirin, NSAIDS, Aleve, Naproxen, Ibuprofen, Advil, Motrin, BC's, Goody's, Fish oil, all herbal medications, and all vitamins.    Do not wear jewelry.  Do not wear lotions, powders, or colognes.    Men may shave face and neck.  Do not bring valuables to the hospital.   [facility] is not responsible for any belongings or valuables.  Contacts, dentures or bridgework may not be worn into surgery.  Leave your suitcase in the car.  After surgery it may be brought to your room.  For patients admitted to the hospital, discharge time will be determined by your treatment team.  Patients discharged the day of surgery will not be allowed to drive home.   Special instructions:  See attached.   Please read over the following fact sheets that you were given. Pain Booklet, Coughing and Deep Breathing, Blood Transfusion Information, Total Joint Packet, MRSA Information and Surgical Site Infection Prevention

## 2015-10-20 ENCOUNTER — Encounter (HOSPITAL_COMMUNITY): Payer: Self-pay

## 2015-10-20 DIAGNOSIS — M1612 Unilateral primary osteoarthritis, left hip: Secondary | ICD-10-CM | POA: Diagnosis not present

## 2015-10-20 DIAGNOSIS — R262 Difficulty in walking, not elsewhere classified: Secondary | ICD-10-CM | POA: Diagnosis not present

## 2015-10-20 NOTE — Progress Notes (Signed)
Anesthesia chart review: Patient is a 75 year old male scheduled for left THA, anterior approach on 10/27/2015 by Dr. Mayer Camel.  History includes nonsmoker, hypertension, GERD, glaucoma, hard of hearing, atrial fibrillation, nephrolithiasis, dyspnea on exertion, migraines, arthritis, left inguinal hernia repair, right rotator cuff repair, bilateral cataract extraction. BMI is consistent with mild obesity.  PCP - Dr. Lujean Amel at [place] - Dr. Percival Spanish, last visit 09/30/15. He wrote, "PREOP: The patientdoes have a high functional level. This is not a high risk surgery from a cardiovascular standpoint. Therefore, based on ACC/AHA guidelines, the patient would be at acceptable risk for the planned procedure without further cardiovascular testing." He did not think bridging was needed.  Meds include amlodipine-benazepril, Eliquis, Lumigan, Trusopt ophthalmic, Toprol, Prilosec. He reported being instructed to hold Eliquis after his 10/24/15 dose.  08/26/15 EKG: AFIB at 93 bpm with premature ventricular or aberrantly conducted complexes. HR 64 bpm at PAT.  09/10/15 Echo: Study Conclusions - Left ventricle: The cavity size was normal. Wall thickness was normal. Systolic function was normal. The estimated ejection fraction was in the range of 55% to 60%. Wall motion was normal; there were no regional wall motion abnormalities. The study is not technically sufficient to allow evaluation of LV diastolic function. - Aortic valve: Trileaflet; mildly thickened, mildly calcified leaflets. - Left atrium: The atrium was moderately dilated. Anterior-posterior dimension: 51 mm. Volume/bsa, ES, (1-plane Simpson&'s, A2C): 46.3 ml/m^2.  08/2015 24 hour Holter monitor: Study Highlights: Atrial fib. Rate somewhat elevated during the waking hours. Occasional PVCs. (Dr. Percival Spanish added metoprolol.)  10/16/15 CXR: IMPRESSION: No acute abnormality noted.  Preoperative labs  noted. He is for PT/PTT on the day of surgery.   Patient has been cleared by his cardiologist. Afib was rate controlled at PAT. If no acute changes then I anticipate that he can proceed as planned. Definitve anesthesia plan (GA verus spinal) following anesthesiologist evaluation.  [facility] Short Stay Center/Anesthesiology Phone [phone] 10/20/2015 1:45 PM

## 2015-10-23 ENCOUNTER — Ambulatory Visit (HOSPITAL_BASED_OUTPATIENT_CLINIC_OR_DEPARTMENT_OTHER): Payer: Medicare Other | Attending: Cardiology | Admitting: *Deleted

## 2015-10-23 VITALS — Ht 70.0 in | Wt 250.0 lb

## 2015-10-23 DIAGNOSIS — G4737 Central sleep apnea in conditions classified elsewhere: Secondary | ICD-10-CM | POA: Diagnosis not present

## 2015-10-23 DIAGNOSIS — R0683 Snoring: Secondary | ICD-10-CM

## 2015-10-23 DIAGNOSIS — G4730 Sleep apnea, unspecified: Secondary | ICD-10-CM

## 2015-10-23 DIAGNOSIS — I481 Persistent atrial fibrillation: Secondary | ICD-10-CM | POA: Diagnosis not present

## 2015-10-23 DIAGNOSIS — I493 Ventricular premature depolarization: Secondary | ICD-10-CM | POA: Diagnosis not present

## 2015-10-23 DIAGNOSIS — Z7901 Long term (current) use of anticoagulants: Secondary | ICD-10-CM | POA: Insufficient documentation

## 2015-10-23 DIAGNOSIS — G4736 Sleep related hypoventilation in conditions classified elsewhere: Secondary | ICD-10-CM | POA: Diagnosis not present

## 2015-10-23 DIAGNOSIS — I4819 Other persistent atrial fibrillation: Secondary | ICD-10-CM

## 2015-10-23 DIAGNOSIS — Z79899 Other long term (current) drug therapy: Secondary | ICD-10-CM | POA: Insufficient documentation

## 2015-10-23 DIAGNOSIS — G4733 Obstructive sleep apnea (adult) (pediatric): Secondary | ICD-10-CM | POA: Diagnosis not present

## 2015-10-24 MED ORDER — DEXTROSE-NACL 5-0.45 % IV SOLN: INTRAVENOUS | Status: DC

## 2015-10-24 MED ORDER — DEXTROSE 5 % IV SOLN
3.0000 g | INTRAVENOUS | Status: AC
  Administered 2015-10-27: 2 g via INTRAVENOUS
  Filled 2015-10-24 (×2): qty 3000

## 2015-10-24 MED ORDER — CHLORHEXIDINE GLUCONATE 4 % EX LIQD: 60.0000 mL | Freq: Once | CUTANEOUS | Status: DC

## 2015-10-24 NOTE — H&P (Signed)
TOTAL HIP ADMISSION H&P  Patient is admitted for left total hip arthroplasty.  Subjective:  Chief Complaint: left hip pain  HPI: Glen Bolton, 75 y.o. male, has a history of pain and functional disability in the left hip(s) due to arthritis and patient has failed non-surgical conservative treatments for greater than 12 weeks to include NSAID's and/or analgesics, flexibility and strengthening excercises, use of assistive devices, weight reduction as appropriate and activity modification.  Onset of symptoms was gradual starting 2 years ago with gradually worsening course since that time.The patient noted no past surgery on the left hip(s).  Patient currently rates pain in the left hip at 10 out of 10 with activity. Patient has night pain, worsening of pain with activity and weight bearing, trendelenberg gait, pain that interfers with activities of daily living and pain with passive range of motion. Patient has evidence of subchondral cysts, periarticular osteophytes and joint space narrowing by imaging studies. This condition presents safety issues increasing the risk of falls.   There is no current active infection.  Patient Active Problem List   Diagnosis Date Noted  . Atrial fibrillation ([place]) 08/26/2015   Past Medical History  Diagnosis Date  . Hypertension   . GERD (gastroesophageal reflux disease)   . Glaucoma   . History of stomach ulcers   . History of gastritis   . Dysrhythmia   . Atrial fibrillation ([place])   . Hard of hearing   . Shortness of breath dyspnea     with exertion  . History of kidney stones   . Nocturia   . Hx of migraines     no migraines since he was in his 41's  . Arthritis     Past Surgical History  Procedure Laterality Date  . Cataract extraction Bilateral   . Rotator cuff surgery Right 2013  . Inguinal hernia repair Left     x 2  . Colonoscopy    . Esophagogastroduodenoscopy      No prescriptions prior to admission   No Known Allergies  Social  History  Substance Use Topics  . Smoking status: Never Smoker   . Smokeless tobacco: Never Used  . Alcohol Use: 0.6 oz/week    1 Glasses of wine, 0 Standard drinks or equivalent per week     Comment: daily    Family History  Problem Relation Age of Onset  . Hyperlipidemia Brother   . Stroke Sister 55     Review of Systems  Constitutional: Negative.   HENT: Positive for hearing loss.   Eyes: Negative.   Respiratory: Negative.   Cardiovascular: Positive for leg swelling.       HTN and irregular heart beat  Gastrointestinal: Negative.   Genitourinary: Negative.   Musculoskeletal: Positive for myalgias and joint pain.  Skin: Negative.   Neurological: Negative.   Endo/Heme/Allergies: Negative.   Psychiatric/Behavioral: Negative.     Objective:  Physical Exam  Constitutional: He is oriented to person, place, and time. He appears well-developed and well-nourished.  HENT:  Head: Normocephalic and atraumatic.  Eyes: Pupils are equal, round, and reactive to light.  Neck: Normal range of motion. Neck supple.  Cardiovascular: Intact distal pulses.   Respiratory: Effort normal.  Musculoskeletal: He exhibits tenderness.  Internal rotational left hip causes significant pain.  He walks with a left-sided Trendelenburg gait.  His skin is intact he is neurovascularly intact distally.    Neurological: He is alert and oriented to person, place, and time.  Skin: Skin is warm and  dry.  Psychiatric: He has a normal mood and affect. His behavior is normal. Judgment and thought content normal.    Vital signs in last 24 hours: Weight:  [113.399 kg (250 lb)] 113.399 kg (250 lb) (02/16 2034)  Labs:   Estimated body mass index is 37.76 kg/(m^2) as calculated from the following:   Height as of 08/26/15: 5' 9.5" (1.765 m).   Weight as of 08/26/15: 117.618 kg (259 lb 4.8 oz).   Imaging Review Plain radiographs demonstrate  AP pelvis and crosstable lateral show end-stage arthritis of the  left hip superior bone-on-bone peripheral osteophytes small cysts in the acetabulum.    Assessment/Plan:  End stage arthritis, left hip(s)  Because of his body habitus he's a candidate for a posterior lateral approach.  The patient history, physical examination, clinical judgement of the provider and imaging studies are consistent with end stage degenerative joint disease of the left hip(s) and total hip arthroplasty is deemed medically necessary. The treatment options including medical management, injection therapy, arthroscopy and arthroplasty were discussed at length. The risks and benefits of total hip arthroplasty were presented and reviewed. The risks due to aseptic loosening, infection, stiffness, dislocation/subluxation,  thromboembolic complications and other imponderables were discussed.  The patient acknowledged the explanation, agreed to proceed with the plan and consent was signed. Patient is being admitted for inpatient treatment for surgery, pain control, PT, OT, prophylactic antibiotics, VTE prophylaxis, progressive ambulation and ADL's and discharge planning.The patient is planning to be discharged home with home health services

## 2015-10-25 DIAGNOSIS — M1612 Unilateral primary osteoarthritis, left hip: Secondary | ICD-10-CM

## 2015-10-25 HISTORY — DX: Unilateral primary osteoarthritis, left hip: M16.12

## 2015-10-26 NOTE — Anesthesia Preprocedure Evaluation (Addendum)
Anesthesia Evaluation  Patient identified by MRN, date of birth, ID band Patient awake    Reviewed: Allergy & Precautions, NPO status , Patient's Chart, lab work & pertinent test results, reviewed documented beta blocker date and time   Airway Mallampati: III  TM Distance: >3 FB Neck ROM: Full    Dental  (+) Teeth Intact   Pulmonary neg pulmonary ROS,    breath sounds clear to auscultation       Cardiovascular hypertension, Pt. on medications and Pt. on home beta blockers + dysrhythmias Atrial Fibrillation  Rhythm:Irregular Rate:Abnormal     Neuro/Psych negative neurological ROS  negative psych ROS   GI/Hepatic Neg liver ROS, GERD  Medicated,  Endo/Other  negative endocrine ROS  Renal/GU negative Renal ROS     Musculoskeletal  (+) Arthritis , Osteoarthritis,    Abdominal   Peds negative pediatric ROS (+)  Hematology negative hematology ROS (+)   Anesthesia Other Findings   Reproductive/Obstetrics negative OB ROS                            Lab Results  Component Value Date   WBC 5.5 10/16/2015   HGB 15.2 10/16/2015   HCT 45.0 10/16/2015   MCV 89.6 10/16/2015   PLT 165 10/16/2015   Lab Results  Component Value Date   CREATININE 0.78 10/16/2015   BUN 17 10/16/2015   NA 141 10/16/2015   K 4.0 10/16/2015   CL 109 10/16/2015   CO2 24 10/16/2015   No results found for: INR, PROTIME  08/2015 EKG Afib w/ PVC's  09/2015 Echo - Left ventricle: The cavity size was normal. Wall thickness was normal. Systolic function was normal. The estimated ejection fraction was in the range of 55% to 60%. Wall motion was normal; there were no regional wall motion abnormalities. The study is not technically sufficient to allow evaluation of LV diastolic function. - Aortic valve: Trileaflet; mildly thickened, mildly calcified leaflets. - Left atrium: The atrium was moderately  dilated. Anterior-posterior dimension: 51 mm. Volume/bsa, ES, (1-plane Simpson&'s, A2C): 46.3 ml/m^2.    Anesthesia Physical Anesthesia Plan  ASA: III  Anesthesia Plan: Spinal   Post-op Pain Management:    Induction: Intravenous  Airway Management Planned: Natural Airway  Additional Equipment:   Intra-op Plan:   Post-operative Plan:   Informed Consent: I have reviewed the patients History and Physical, chart, labs and discussed the procedure including the risks, benefits and alternatives for the proposed anesthesia with the patient or authorized representative who has indicated his/her understanding and acceptance.     Plan Discussed with: CRNA  Anesthesia Plan Comments: (Confirm stop date of apixaban (at least 3 days).)        Anesthesia Quick Evaluation

## 2015-10-27 ENCOUNTER — Inpatient Hospital Stay (HOSPITAL_COMMUNITY): Payer: Medicare Other | Admitting: Anesthesiology

## 2015-10-27 ENCOUNTER — Inpatient Hospital Stay (HOSPITAL_COMMUNITY): Payer: Medicare Other | Admitting: Vascular Surgery

## 2015-10-27 ENCOUNTER — Inpatient Hospital Stay (HOSPITAL_COMMUNITY)
Admission: RE | Admit: 2015-10-27 | Discharge: 2015-10-29 | DRG: 470 | Disposition: A | Discharge status: Home Health | Payer: Medicare Other | Source: Ambulatory Visit | Attending: Orthopedic Surgery | Admitting: Orthopedic Surgery

## 2015-10-27 ENCOUNTER — Encounter (HOSPITAL_COMMUNITY)
Admission: RE | Disposition: A | Discharge status: Home Health | Payer: Self-pay | Source: Ambulatory Visit | Attending: Orthopedic Surgery

## 2015-10-27 ENCOUNTER — Inpatient Hospital Stay (HOSPITAL_COMMUNITY): Payer: Medicare Other

## 2015-10-27 ENCOUNTER — Encounter (HOSPITAL_COMMUNITY): Payer: Self-pay | Admitting: *Deleted

## 2015-10-27 DIAGNOSIS — Z96642 Presence of left artificial hip joint: Secondary | ICD-10-CM | POA: Diagnosis not present

## 2015-10-27 DIAGNOSIS — H409 Unspecified glaucoma: Secondary | ICD-10-CM | POA: Diagnosis present

## 2015-10-27 DIAGNOSIS — Z9842 Cataract extraction status, left eye: Secondary | ICD-10-CM | POA: Diagnosis not present

## 2015-10-27 DIAGNOSIS — Z471 Aftercare following joint replacement surgery: Secondary | ICD-10-CM | POA: Diagnosis not present

## 2015-10-27 DIAGNOSIS — D62 Acute posthemorrhagic anemia: Secondary | ICD-10-CM | POA: Diagnosis not present

## 2015-10-27 DIAGNOSIS — I4891 Unspecified atrial fibrillation: Secondary | ICD-10-CM | POA: Diagnosis present

## 2015-10-27 DIAGNOSIS — Z9841 Cataract extraction status, right eye: Secondary | ICD-10-CM

## 2015-10-27 DIAGNOSIS — K219 Gastro-esophageal reflux disease without esophagitis: Secondary | ICD-10-CM | POA: Diagnosis not present

## 2015-10-27 DIAGNOSIS — H9190 Unspecified hearing loss, unspecified ear: Secondary | ICD-10-CM | POA: Diagnosis present

## 2015-10-27 DIAGNOSIS — Z79899 Other long term (current) drug therapy: Secondary | ICD-10-CM

## 2015-10-27 DIAGNOSIS — M1612 Unilateral primary osteoarthritis, left hip: Principal | ICD-10-CM | POA: Diagnosis present

## 2015-10-27 DIAGNOSIS — Z8711 Personal history of peptic ulcer disease: Secondary | ICD-10-CM

## 2015-10-27 DIAGNOSIS — M169 Osteoarthritis of hip, unspecified: Secondary | ICD-10-CM | POA: Diagnosis not present

## 2015-10-27 DIAGNOSIS — Z7901 Long term (current) use of anticoagulants: Secondary | ICD-10-CM

## 2015-10-27 DIAGNOSIS — M25552 Pain in left hip: Secondary | ICD-10-CM | POA: Diagnosis not present

## 2015-10-27 DIAGNOSIS — Z419 Encounter for procedure for purposes other than remedying health state, unspecified: Secondary | ICD-10-CM

## 2015-10-27 DIAGNOSIS — I10 Essential (primary) hypertension: Secondary | ICD-10-CM | POA: Diagnosis present

## 2015-10-27 HISTORY — PX: TOTAL HIP ARTHROPLASTY: SHX124

## 2015-10-27 LAB — PROTIME-INR
INR: 1.17 (ref 0.00–1.49)
Prothrombin Time: 15 seconds (ref 11.6–15.2)

## 2015-10-27 LAB — APTT: APTT: 38 s — AB (ref 24–37)

## 2015-10-27 SURGERY — ARTHROPLASTY, HIP, TOTAL, ANTERIOR APPROACH
Anesthesia: Spinal | Site: Hip | Laterality: Left

## 2015-10-27 MED ORDER — PHENYLEPHRINE 40 MCG/ML (10ML) SYRINGE FOR IV PUSH (FOR BLOOD PRESSURE SUPPORT)
PREFILLED_SYRINGE | INTRAVENOUS | Status: AC
  Filled 2015-10-27: qty 10

## 2015-10-27 MED ORDER — ONDANSETRON HCL 4 MG/2ML IJ SOLN
INTRAMUSCULAR | Status: DC | PRN
  Administered 2015-10-27: 4 mg via INTRAVENOUS

## 2015-10-27 MED ORDER — METOCLOPRAMIDE HCL 5 MG PO TABS: 5.0000 mg | ORAL_TABLET | Freq: Three times a day (TID) | ORAL | Status: DC | PRN

## 2015-10-27 MED ORDER — EPHEDRINE SULFATE 50 MG/ML IJ SOLN
INTRAMUSCULAR | Status: AC
  Filled 2015-10-27: qty 1

## 2015-10-27 MED ORDER — PROPOFOL 500 MG/50ML IV EMUL
INTRAVENOUS | Status: DC | PRN
  Administered 2015-10-27: 10:00:00 via INTRAVENOUS
  Administered 2015-10-27: 75 ug/kg/min via INTRAVENOUS

## 2015-10-27 MED ORDER — SODIUM CHLORIDE 0.9 % IJ SOLN
INTRAMUSCULAR | Status: AC
  Filled 2015-10-27: qty 10

## 2015-10-27 MED ORDER — LIDOCAINE HCL (CARDIAC) 20 MG/ML IV SOLN
INTRAVENOUS | Status: AC
  Filled 2015-10-27: qty 5

## 2015-10-27 MED ORDER — MENTHOL 3 MG MT LOZG: 1.0000 | LOZENGE | OROMUCOSAL | Status: DC | PRN

## 2015-10-27 MED ORDER — ALUMINUM HYDROXIDE GEL 320 MG/5ML PO SUSP
15.0000 mL | ORAL | Status: DC | PRN
  Filled 2015-10-27: qty 30

## 2015-10-27 MED ORDER — ACETAMINOPHEN 325 MG PO TABS: 650.0000 mg | ORAL_TABLET | Freq: Four times a day (QID) | ORAL | Status: DC | PRN

## 2015-10-27 MED ORDER — HYDROMORPHONE HCL 1 MG/ML IJ SOLN
INTRAMUSCULAR | Status: AC
  Filled 2015-10-27: qty 1

## 2015-10-27 MED ORDER — METHOCARBAMOL 1000 MG/10ML IJ SOLN
500.0000 mg | Freq: Four times a day (QID) | INTRAVENOUS | Status: DC | PRN
  Filled 2015-10-27: qty 5

## 2015-10-27 MED ORDER — METOPROLOL SUCCINATE ER 50 MG PO TB24
50.0000 mg | ORAL_TABLET | Freq: Every day | ORAL | Status: DC
  Administered 2015-10-28 – 2015-10-29 (×2): 50 mg via ORAL
  Filled 2015-10-27 (×2): qty 1

## 2015-10-27 MED ORDER — AMLODIPINE BESYLATE 10 MG PO TABS
10.0000 mg | ORAL_TABLET | Freq: Every day | ORAL | Status: DC
  Administered 2015-10-28 – 2015-10-29 (×2): 10 mg via ORAL
  Filled 2015-10-27 (×2): qty 1

## 2015-10-27 MED ORDER — 0.9 % SODIUM CHLORIDE (POUR BTL) OPTIME
TOPICAL | Status: DC | PRN
  Administered 2015-10-27: 1000 mL

## 2015-10-27 MED ORDER — SUCCINYLCHOLINE CHLORIDE 20 MG/ML IJ SOLN
INTRAMUSCULAR | Status: AC
  Filled 2015-10-27: qty 1

## 2015-10-27 MED ORDER — PROPOFOL 10 MG/ML IV BOLUS
INTRAVENOUS | Status: AC
  Filled 2015-10-27: qty 20

## 2015-10-27 MED ORDER — DIPHENHYDRAMINE HCL 12.5 MG/5ML PO ELIX: 12.5000 mg | ORAL_SOLUTION | ORAL | Status: DC | PRN

## 2015-10-27 MED ORDER — MIDAZOLAM HCL 2 MG/2ML IJ SOLN
INTRAMUSCULAR | Status: AC
  Filled 2015-10-27: qty 2

## 2015-10-27 MED ORDER — ONDANSETRON HCL 4 MG/2ML IJ SOLN: 4.0000 mg | Freq: Four times a day (QID) | INTRAMUSCULAR | Status: DC | PRN

## 2015-10-27 MED ORDER — PHENYLEPHRINE HCL 10 MG/ML IJ SOLN
INTRAMUSCULAR | Status: DC | PRN
  Administered 2015-10-27: 80 ug via INTRAVENOUS
  Administered 2015-10-27: 40 ug via INTRAVENOUS
  Administered 2015-10-27: 80 ug via INTRAVENOUS
  Administered 2015-10-27: 120 ug via INTRAVENOUS

## 2015-10-27 MED ORDER — LIDOCAINE HCL (CARDIAC) 20 MG/ML IV SOLN
INTRAVENOUS | Status: DC | PRN
  Administered 2015-10-27: 40 mg via INTRAVENOUS

## 2015-10-27 MED ORDER — DEXAMETHASONE SODIUM PHOSPHATE 10 MG/ML IJ SOLN
10.0000 mg | Freq: Once | INTRAMUSCULAR | Status: AC
  Administered 2015-10-28: 10 mg via INTRAVENOUS
  Filled 2015-10-27: qty 1

## 2015-10-27 MED ORDER — HYDROMORPHONE HCL 1 MG/ML IJ SOLN: 0.5000 mg | INTRAMUSCULAR | Status: DC | PRN

## 2015-10-27 MED ORDER — PANTOPRAZOLE SODIUM 40 MG PO TBEC
40.0000 mg | DELAYED_RELEASE_TABLET | Freq: Every day | ORAL | Status: DC
  Administered 2015-10-27 – 2015-10-29 (×3): 40 mg via ORAL
  Filled 2015-10-27 (×3): qty 1

## 2015-10-27 MED ORDER — BUPIVACAINE IN DEXTROSE 0.75-8.25 % IT SOLN
INTRATHECAL | Status: DC | PRN
  Administered 2015-10-27: 2 mL via INTRATHECAL

## 2015-10-27 MED ORDER — FENTANYL CITRATE (PF) 250 MCG/5ML IJ SOLN
INTRAMUSCULAR | Status: AC
  Filled 2015-10-27: qty 5

## 2015-10-27 MED ORDER — DORZOLAMIDE HCL 2 % OP SOLN
1.0000 [drp] | Freq: Two times a day (BID) | OPHTHALMIC | Status: DC
  Administered 2015-10-27 – 2015-10-29 (×4): 1 [drp] via OPHTHALMIC
  Filled 2015-10-27: qty 10

## 2015-10-27 MED ORDER — AMLODIPINE BESY-BENAZEPRIL HCL 10-20 MG PO CAPS: 1.0000 | ORAL_CAPSULE | Freq: Every day | ORAL | Status: DC

## 2015-10-27 MED ORDER — BENAZEPRIL HCL 20 MG PO TABS
20.0000 mg | ORAL_TABLET | Freq: Every day | ORAL | Status: DC
  Administered 2015-10-28 – 2015-10-29 (×2): 20 mg via ORAL
  Filled 2015-10-27 (×2): qty 1

## 2015-10-27 MED ORDER — PHENOL 1.4 % MT LIQD: 1.0000 | OROMUCOSAL | Status: DC | PRN

## 2015-10-27 MED ORDER — PROMETHAZINE HCL 25 MG/ML IJ SOLN: 6.2500 mg | INTRAMUSCULAR | Status: DC | PRN

## 2015-10-27 MED ORDER — PHENYLEPHRINE HCL 10 MG/ML IJ SOLN
10.0000 mg | INTRAVENOUS | Status: DC | PRN
  Administered 2015-10-27: 30 ug/min via INTRAVENOUS

## 2015-10-27 MED ORDER — KCL IN DEXTROSE-NACL 20-5-0.45 MEQ/L-%-% IV SOLN
INTRAVENOUS | Status: DC
  Administered 2015-10-27: 16:00:00 via INTRAVENOUS
  Filled 2015-10-27: qty 1000

## 2015-10-27 MED ORDER — METOCLOPRAMIDE HCL 5 MG/ML IJ SOLN: 5.0000 mg | Freq: Three times a day (TID) | INTRAMUSCULAR | Status: DC | PRN

## 2015-10-27 MED ORDER — ONDANSETRON HCL 4 MG/2ML IJ SOLN
INTRAMUSCULAR | Status: AC
  Filled 2015-10-27: qty 2

## 2015-10-27 MED ORDER — TRANEXAMIC ACID 1000 MG/10ML IV SOLN: 2000.0000 mg | Freq: Once | INTRAVENOUS | Status: DC

## 2015-10-27 MED ORDER — TRANEXAMIC ACID 1000 MG/10ML IV SOLN
2000.0000 mg | Freq: Once | INTRAVENOUS | Status: AC
  Administered 2015-10-27: 2000 mg via TOPICAL
  Filled 2015-10-27: qty 20

## 2015-10-27 MED ORDER — MIDAZOLAM HCL 5 MG/5ML IJ SOLN
INTRAMUSCULAR | Status: DC | PRN
  Administered 2015-10-27 (×2): 1 mg via INTRAVENOUS

## 2015-10-27 MED ORDER — GLYCOPYRROLATE 0.2 MG/ML IJ SOLN
INTRAMUSCULAR | Status: AC
  Filled 2015-10-27: qty 1

## 2015-10-27 MED ORDER — ONDANSETRON HCL 4 MG PO TABS: 4.0000 mg | ORAL_TABLET | Freq: Four times a day (QID) | ORAL | Status: DC | PRN

## 2015-10-27 MED ORDER — METHOCARBAMOL 500 MG PO TABS
500.0000 mg | ORAL_TABLET | Freq: Four times a day (QID) | ORAL | Status: DC | PRN
  Administered 2015-10-27 – 2015-10-28 (×2): 500 mg via ORAL
  Filled 2015-10-27 (×2): qty 1

## 2015-10-27 MED ORDER — ACETAMINOPHEN 650 MG RE SUPP: 650.0000 mg | Freq: Four times a day (QID) | RECTAL | Status: DC | PRN

## 2015-10-27 MED ORDER — OXYCODONE HCL 5 MG PO TABS
5.0000 mg | ORAL_TABLET | ORAL | Status: DC | PRN
  Administered 2015-10-27 – 2015-10-29 (×9): 10 mg via ORAL
  Filled 2015-10-27 (×9): qty 2

## 2015-10-27 MED ORDER — LACTATED RINGERS IV SOLN
INTRAVENOUS | Status: DC | PRN
  Administered 2015-10-27 (×3): via INTRAVENOUS

## 2015-10-27 MED ORDER — MEPERIDINE HCL 25 MG/ML IJ SOLN: 6.2500 mg | INTRAMUSCULAR | Status: DC | PRN

## 2015-10-27 MED ORDER — FENTANYL CITRATE (PF) 100 MCG/2ML IJ SOLN
INTRAMUSCULAR | Status: DC | PRN
  Administered 2015-10-27: 50 ug via INTRAVENOUS

## 2015-10-27 MED ORDER — LATANOPROST 0.005 % OP SOLN
1.0000 [drp] | Freq: Every day | OPHTHALMIC | Status: DC
  Administered 2015-10-27 – 2015-10-28 (×2): 1 [drp] via OPHTHALMIC
  Filled 2015-10-27: qty 2.5

## 2015-10-27 MED ORDER — DEXAMETHASONE SODIUM PHOSPHATE 4 MG/ML IJ SOLN
INTRAMUSCULAR | Status: AC
  Filled 2015-10-27: qty 1

## 2015-10-27 MED ORDER — HYDROMORPHONE HCL 1 MG/ML IJ SOLN
0.2500 mg | INTRAMUSCULAR | Status: DC | PRN
  Administered 2015-10-27: 0.5 mg via INTRAVENOUS

## 2015-10-27 MED ORDER — ROCURONIUM BROMIDE 50 MG/5ML IV SOLN
INTRAVENOUS | Status: AC
  Filled 2015-10-27: qty 1

## 2015-10-27 MED ORDER — APIXABAN 2.5 MG PO TABS: 2.5000 mg | ORAL_TABLET | Freq: Two times a day (BID) | ORAL | Status: DC

## 2015-10-27 MED ORDER — LACTATED RINGERS IV SOLN: INTRAVENOUS | Status: DC

## 2015-10-27 SURGICAL SUPPLY — 45 items
BLADE SURG ROTATE 9660 (MISCELLANEOUS) IMPLANT
CAPT HIP TOTAL 2 ×2 IMPLANT
COVER PERINEAL POST (MISCELLANEOUS) ×2 IMPLANT
COVER SURGICAL LIGHT HANDLE (MISCELLANEOUS) ×2 IMPLANT
DRAPE C-ARM 42X72 X-RAY (DRAPES) ×2 IMPLANT
DRAPE STERI IOBAN 125X83 (DRAPES) ×2 IMPLANT
DRAPE U-SHAPE 47X51 STRL (DRAPES) ×4 IMPLANT
DRSG AQUACEL AG ADV 3.5X10 (GAUZE/BANDAGES/DRESSINGS) ×2 IMPLANT
DURAPREP 26ML APPLICATOR (WOUND CARE) ×2 IMPLANT
ELECT BLADE 4.0 EZ CLEAN MEGAD (MISCELLANEOUS) ×2
ELECT REM PT RETURN 9FT ADLT (ELECTROSURGICAL) ×2
ELECTRODE BLDE 4.0 EZ CLN MEGD (MISCELLANEOUS) ×1 IMPLANT
ELECTRODE REM PT RTRN 9FT ADLT (ELECTROSURGICAL) ×1 IMPLANT
FACESHIELD WRAPAROUND (MASK) ×4 IMPLANT
GLOVE BIO SURGEON STRL SZ7.5 (GLOVE) ×6 IMPLANT
GLOVE BIO SURGEON STRL SZ8.5 (GLOVE) ×4 IMPLANT
GLOVE BIOGEL PI IND STRL 8 (GLOVE) ×2 IMPLANT
GLOVE BIOGEL PI IND STRL 9 (GLOVE) ×1 IMPLANT
GLOVE BIOGEL PI INDICATOR 8 (GLOVE) ×2
GLOVE BIOGEL PI INDICATOR 9 (GLOVE) ×1
GOWN STRL REUS W/ TWL LRG LVL3 (GOWN DISPOSABLE) ×1 IMPLANT
GOWN STRL REUS W/ TWL XL LVL3 (GOWN DISPOSABLE) ×2 IMPLANT
GOWN STRL REUS W/TWL LRG LVL3 (GOWN DISPOSABLE) ×1
GOWN STRL REUS W/TWL XL LVL3 (GOWN DISPOSABLE) ×2
KIT BASIN OR (CUSTOM PROCEDURE TRAY) ×2 IMPLANT
KIT ROOM TURNOVER OR (KITS) ×2 IMPLANT
MANIFOLD NEPTUNE II (INSTRUMENTS) ×2 IMPLANT
NS IRRIG 1000ML POUR BTL (IV SOLUTION) ×2 IMPLANT
PACK TOTAL JOINT (CUSTOM PROCEDURE TRAY) ×2 IMPLANT
PACK UNIVERSAL I (CUSTOM PROCEDURE TRAY) ×2 IMPLANT
PAD ARMBOARD 7.5X6 YLW CONV (MISCELLANEOUS) ×4 IMPLANT
SAW OSC TIP CART 19.5X105X1.3 (SAW) ×2 IMPLANT
SUT ETHIBOND NAB CT1 #1 30IN (SUTURE) ×4 IMPLANT
SUT VIC AB 0 CT1 27 (SUTURE) ×1
SUT VIC AB 0 CT1 27XBRD ANBCTR (SUTURE) ×1 IMPLANT
SUT VIC AB 1 CT1 27 (SUTURE) ×1
SUT VIC AB 1 CT1 27XBRD ANBCTR (SUTURE) ×1 IMPLANT
SUT VIC AB 2-0 CT1 27 (SUTURE) ×1
SUT VIC AB 2-0 CT1 TAPERPNT 27 (SUTURE) ×1 IMPLANT
SUT VIC AB 3-0 PS2 18 (SUTURE) ×1
SUT VIC AB 3-0 PS2 18XBRD (SUTURE) ×1 IMPLANT
TOWEL OR 17X24 6PK STRL BLUE (TOWEL DISPOSABLE) ×2 IMPLANT
TOWEL OR 17X26 10 PK STRL BLUE (TOWEL DISPOSABLE) ×2 IMPLANT
TRAY FOLEY CATH 14FR (SET/KITS/TRAYS/PACK) IMPLANT
WATER STERILE IRR 1000ML POUR (IV SOLUTION) ×4 IMPLANT

## 2015-10-27 NOTE — Evaluation (Signed)
Physical Therapy Evaluation Patient Details Name: Glen Bolton MRN: [id number] DOB: 1941/06/23 Today's Date: 10/27/2015   History of Present Illness  Pt is a 75 y/o M s/p Lt THA.  Pt's PMH includes a-fib, use of hearing aids, SOB, Rt rotator cuff surgery.  Clinical Impression  Pt is s/p Lt THA resulting in the deficits listed below (see PT Problem List). Mr. Ridolfi has intermittent assist from family at d/c as his wife works 11am-3pm.  He ambulated 40 ft using RW w/ min guard assist today. He was independent prior to admission and is planning on returning home at d/c. Pt will benefit from skilled PT to increase their independence and safety with mobility to allow discharge to the venue listed below.     Follow Up Recommendations Home health PT;Supervision - Intermittent    Equipment Recommendations  3in1 (PT)    Recommendations for Other Services OT consult     Precautions / Restrictions Precautions Precautions: Fall Precaution Comments: no orders including hip precautions, direct anterior? Restrictions Weight Bearing Restrictions: Yes LLE Weight Bearing: Weight bearing as tolerated      Mobility  Bed Mobility Overal bed mobility: Needs Assistance Bed Mobility: Supine to Sit     Supine to sit: Min assist;HOB elevated     General bed mobility comments: Cues for sequencing and min assist managing Lt LE to edge of bed.  Heavy use of bed rails to pull up to sitting w/ min assist provided to trunk to boost up to sitting.  Transfers Overall transfer level: Needs assistance Equipment used: Rolling walker (2 wheeled) Transfers: Sit to/from Stand Sit to Stand: Min guard;From elevated surface         General transfer comment: Cues for hand placement and pt slow to stand.  Min guard assist for pt's safety.  Ambulation/Gait Ambulation/Gait assistance: Min guard Ambulation Distance (Feet): 40 Feet Assistive device: Rolling walker (2 wheeled) Gait Pattern/deviations: Step-to  pattern;Decreased stride length;Antalgic;Trunk flexed;Decreased weight shift to left   Gait velocity interpretation: Below normal speed for age/gender General Gait Details: Cues for greater step length as pt initially taking small steps.  Cues for upright posture and to look ahead.  Pt reports generalized fatigue and requests to stop after 40 ft.  Stairs            Wheelchair Mobility    Modified Rankin (Stroke Patients Only)       Balance Overall balance assessment: Needs assistance Sitting-balance support: Feet supported;Bilateral upper extremity supported Sitting balance-Leahy Scale: Fair     Standing balance support: Bilateral upper extremity supported;During functional activity Standing balance-Leahy Scale: Poor Standing balance comment: Relies on RW for support                             Pertinent Vitals/Pain Pain Assessment: 0-10 Pain Score: 4  Pain Location: Lt hip Pain Descriptors / Indicators: Aching;Sore ("stiff") Pain Intervention(s): Limited activity within patient's tolerance;Monitored during session;Repositioned;Premedicated before session    [place] expects to be discharged to:: Private residence Living Arrangements: Spouse/significant other;Other relatives (sister in law, mother in law) Available Help at Discharge: Family;Available PRN/intermittently (wife at work 11-3 and is not taking time off work) Type of Home: House Home Access: Stairs to enter Entrance Stairs-Rails: None Technical brewer of Steps: 1 [place]: One level Home Equipment: Environmental consultant - 2 wheels;Wheelchair - manual Additional Comments: Mother in law ambulates w/ RW and will not be able to assist pt.  Sister  and wife at work during the day.    Prior Function Level of Independence: Independent               Hand Dominance        Extremity/Trunk Assessment   Upper Extremity Assessment: Defer to OT evaluation           Lower  Extremity Assessment: LLE deficits/detail   LLE Deficits / Details: limited ROM and strength as expected s/p Lt THA  Cervical / Trunk Assessment: Kyphotic  Communication   Communication: HOH (has hearing aids)  Cognition Arousal/Alertness: Awake/alert Behavior During Therapy: WFL for tasks assessed/performed Overall Cognitive Status: Within Functional Limits for tasks assessed                      General Comments      Exercises Total Joint Exercises Ankle Circles/Pumps: AROM;Both;15 reps;Supine;Seated Hip ABduction/ADduction: AAROM;Left;10 reps;Seated Long Arc Quad: AROM;Left;10 reps;Seated      Assessment/Plan    PT Assessment Patient needs continued PT services  PT Diagnosis Difficulty walking;Abnormality of gait;Acute pain   PT Problem List Decreased strength;Decreased range of motion;Decreased activity tolerance;Decreased mobility;Decreased balance;Decreased knowledge of use of DME;Decreased safety awareness;Decreased knowledge of precautions;Pain  PT Treatment Interventions DME instruction;Gait training;Functional mobility training;Stair training;Therapeutic activities;Therapeutic exercise;Balance training;Patient/family education;Modalities   PT Goals (Current goals can be found in the Care Plan section) Acute Rehab PT Goals Patient Stated Goal: to go home PT Goal Formulation: With patient/family Time For Goal Achievement: 11/03/15 Potential to Achieve Goals: Good    Frequency 7X/week   Barriers to discharge Inaccessible home environment;Decreased caregiver support 1 step to enter home, intermittent assist from family    Co-evaluation               End of Session Equipment Utilized During Treatment: Gait belt Activity Tolerance: Patient tolerated treatment well;Patient limited by fatigue Patient left: in chair;with call bell/phone within reach;with family/visitor present Nurse Communication: Mobility status;Weight bearing status         Time:  [id number] PT Time Calculation (min) (ACUTE ONLY): 24 min   Charges:   PT Evaluation $PT Eval Moderate Complexity: 1 Procedure PT Treatments $Gait Training: 8-22 mins   PT G Codes:       Joslyn Hy PT, DPT [phone] Pager: [phone] 10/27/2015, 4:55 PM

## 2015-10-27 NOTE — Transfer of Care (Signed)
Immediate Anesthesia Transfer of Care Note  Patient: Glen Bolton  Procedure(s) Performed: Procedure(s): TOTAL HIP ARTHROPLASTY ANTERIOR APPROACH (Left)  Patient Location: PACU  Anesthesia Type:MAC and Spinal  Level of Consciousness: awake, alert , oriented and patient cooperative  Airway & Oxygen Therapy: Patient Spontanous Breathing and Patient connected to face mask oxygen  Post-op Assessment: Report given to RN and Post -op Vital signs reviewed and stable  Post vital signs: Reviewed  Last Vitals:  Filed Vitals:   10/27/15 0619 10/27/15 1040  BP: 134/77   Pulse: 82   Temp: 37.1 C 36.4 C  Resp: 16     Complications: No apparent anesthesia complications

## 2015-10-27 NOTE — Interval H&P Note (Signed)
History and Physical Interval Note:  10/27/2015 7:12 AM  Glen Bolton  has presented today for surgery, with the diagnosis of LEFT HIP OSTEOARTHRITIS  The various methods of treatment have been discussed with the patient and family. After consideration of risks, benefits and other options for treatment, the patient has consented to  Procedure(s): TOTAL HIP ARTHROPLASTY ANTERIOR APPROACH (Left) as a surgical intervention .  The patient's history has been reviewed, patient examined, no change in status, stable for surgery.  I have reviewed the patient's chart and labs.  Questions were answered to the patient's satisfaction.     Kerin Salen

## 2015-10-27 NOTE — Discharge Instructions (Addendum)
INSTRUCTIONS AFTER JOINT REPLACEMENT  ° °o Remove items at home which could result in a fall. This includes throw rugs or furniture in walking pathways °o ICE to the affected joint every three hours while awake for 30 minutes at a time, for at least the first 3-5 days, and then as needed for pain and swelling.  Continue to use ice for pain and swelling. You may notice swelling that will progress down to the foot and ankle.  This is normal after surgery.  Elevate your leg when you are not up walking on it.   °o Continue to use the breathing machine you got in the hospital (incentive spirometer) which will help keep your temperature down.  It is common for your temperature to cycle up and down following surgery, especially at night when you are not up moving around and exerting yourself.  The breathing machine keeps your lungs expanded and your temperature down. ° ° °DIET:  As you were doing prior to hospitalization, we recommend a well-balanced diet. ° °DRESSING / WOUND CARE / SHOWERING ° °Keep the surgical dressing until follow up.  The dressing is water proof, so you can shower without any extra covering.  IF THE DRESSING FALLS OFF or the wound gets wet inside, change the dressing with sterile gauze.  Please use good hand washing techniques before changing the dressing.  Do not use any lotions or creams on the incision until instructed by your surgeon.   ° °ACTIVITY ° °o Increase activity slowly as tolerated, but follow the weight bearing instructions below.   °o No driving for 6 weeks or until further direction given by your physician.  You cannot drive while taking narcotics.  °o No lifting or carrying greater than 10 lbs. until further directed by your surgeon. °o Avoid periods of inactivity such as sitting longer than an hour when not asleep. This helps prevent blood clots.  °o You may return to work once you are authorized by your doctor.  ° ° ° °WEIGHT BEARING  ° °Weight bearing as tolerated with assist  device (walker, cane, etc) as directed, use it as long as suggested by your surgeon or therapist, typically at least 4-6 weeks. ° ° °EXERCISES ° °Results after joint replacement surgery are often greatly improved when you follow the exercise, range of motion and muscle strengthening exercises prescribed by your doctor. Safety measures are also important to protect the joint from further injury. Any time any of these exercises cause you to have increased pain or swelling, decrease what you are doing until you are comfortable again and then slowly increase them. If you have problems or questions, call your caregiver or physical therapist for advice.  ° °Rehabilitation is important following a joint replacement. After just a few days of immobilization, the muscles of the leg can become weakened and shrink (atrophy).  These exercises are designed to build up the tone and strength of the thigh and leg muscles and to improve motion. Often times heat used for twenty to thirty minutes before working out will loosen up your tissues and help with improving the range of motion but do not use heat for the first two weeks following surgery (sometimes heat can increase post-operative swelling).  ° °These exercises can be done on a training (exercise) mat, on the floor, on a table or on a bed. Use whatever works the best and is most comfortable for you.    Use music or television while you are exercising so that   the exercises are a pleasant break in your day. This will make your life better with the exercises acting as a break in your routine that you can look forward to.   Perform all exercises about fifteen times, three times per day or as directed.  You should exercise both the operative leg and the other leg as well. ° °Exercises include: °  °• Quad Sets - Tighten up the muscle on the front of the thigh (Quad) and hold for 5-10 seconds.   °• Straight Leg Raises - With your knee straight (if you were given a brace, keep it on),  lift the leg to 60 degrees, hold for 3 seconds, and slowly lower the leg.  Perform this exercise against resistance later as your leg gets stronger.  °• Leg Slides: Lying on your back, slowly slide your foot toward your buttocks, bending your knee up off the floor (only go as far as is comfortable). Then slowly slide your foot back down until your leg is flat on the floor again.  °• Angel Wings: Lying on your back spread your legs to the side as far apart as you can without causing discomfort.  °• Hamstring Strength:  Lying on your back, push your heel against the floor with your leg straight by tightening up the muscles of your buttocks.  Repeat, but this time bend your knee to a comfortable angle, and push your heel against the floor.  You may put a pillow under the heel to make it more comfortable if necessary.  ° °A rehabilitation program following joint replacement surgery can speed recovery and prevent re-injury in the future due to weakened muscles. Contact your doctor or a physical therapist for more information on knee rehabilitation.  ° ° °CONSTIPATION ° °Constipation is defined medically as fewer than three stools per week and severe constipation as less than one stool per week.  Even if you have a regular bowel pattern at home, your normal regimen is likely to be disrupted due to multiple reasons following surgery.  Combination of anesthesia, postoperative narcotics, change in appetite and fluid intake all can affect your bowels.  ° °YOU MUST use at least one of the following options; they are listed in order of increasing strength to get the job done.  They are all available over the counter, and you may need to use some, POSSIBLY even all of these options:   ° °Drink plenty of fluids (prune juice may be helpful) and high fiber foods °Colace 100 mg by mouth twice a day  °Senokot for constipation as directed and as needed Dulcolax (bisacodyl), take with full glass of water  °Miralax (polyethylene glycol)  once or twice a day as needed. ° °If you have tried all these things and are unable to have a bowel movement in the first 3-4 days after surgery call either your surgeon or your primary doctor.   ° °If you experience loose stools or diarrhea, hold the medications until you stool forms back up.  If your symptoms do not get better within 1 week or if they get worse, check with your doctor.  If you experience "the worst abdominal pain ever" or develop nausea or vomiting, please contact the office immediately for further recommendations for treatment. ° ° °ITCHING:  If you experience itching with your medications, try taking only a single pain pill, or even half a pain pill at a time.  You can also use Benadryl over the counter for itching or also to   help with sleep.   TED HOSE STOCKINGS:  Use stockings on both legs until for at least 2 weeks or as directed by physician office. They may be removed at night for sleeping.  MEDICATIONS:  See your medication summary on the After Visit Summary that nursing will review with you.  You may have some home medications which will be placed on hold until you complete the course of blood thinner medication.  It is important for you to complete the blood thinner medication as prescribed.  PRECAUTIONS:  If you experience chest pain or shortness of breath - call 911 immediately for transfer to the hospital emergency department.   If you develop a fever greater that 101 F, purulent drainage from wound, increased redness or drainage from wound, foul odor from the wound/dressing, or calf pain - CONTACT YOUR SURGEON.                                                   FOLLOW-UP APPOINTMENTS:  If you do not already have a post-op appointment, please call the office for an appointment to be seen by your surgeon.  Guidelines for how soon to be seen are listed in your After Visit Summary, but are typically between 1-4 weeks after surgery.  OTHER INSTRUCTIONS:   Knee  Replacement:  Do not place pillow under knee, focus on keeping the knee straight while resting. CPM instructions: 0-90 degrees, 2 hours in the morning, 2 hours in the afternoon, and 2 hours in the evening. Place foam block, curve side up under heel at all times except when in CPM or when walking.  DO NOT modify, tear, cut, or change the foam block in any way.  MAKE SURE YOU:   Understand these instructions.   Get help right away if you are not doing well or get worse.    Thank you for letting us be a part of your medical care team.  It is a privilege we respect greatly.  We hope these instructions will help you stay on track for a fast and full recovery!   Information on my medicine - ELIQUIS (apixaban)  This medication education was reviewed with me or my healthcare representative as part of my discharge preparation.  The pharmacist that spoke with me during my hospital stay was:  Lavenia Atlas, [facility]  Why was Eliquis prescribed for you? Eliquis was prescribed for you to reduce the risk of a blood clot forming that can cause a stroke if you have a medical condition called atrial fibrillation (a type of irregular heartbeat).  What do You need to know about Eliquis ? Take your Eliquis TWICE DAILY - one tablet in the morning and one tablet in the evening with or without food. If you have difficulty swallowing the tablet whole please discuss with your pharmacist how to take the medication safely.  Take Eliquis exactly as prescribed by your doctor and DO NOT stop taking Eliquis without talking to the doctor who prescribed the medication.  Stopping may increase your risk of developing a stroke.  Refill your prescription before you run out.  After discharge, you should have regular check-up appointments with your healthcare provider that is prescribing your Eliquis.  In the future your dose may need to be changed if your kidney function or weight changes by a significant amount or as  you get older.  What do you do if you miss a dose? If you miss a dose, take it as soon as you remember on the same day and resume taking twice daily.  Do not take more than one dose of ELIQUIS at the same time to make up a missed dose.  Important Safety Information A possible side effect of Eliquis is bleeding. You should call your healthcare provider right away if you experience any of the following: ? Bleeding from an injury or your nose that does not stop. ? Unusual colored urine (red or dark brown) or unusual colored stools (red or black). ? Unusual bruising for unknown reasons. ? A serious fall or if you hit your head (even if there is no bleeding).  Some medicines may interact with Eliquis and might increase your risk of bleeding or clotting while on Eliquis. To help avoid this, consult your healthcare provider or pharmacist prior to using any new prescription or non-prescription medications, including herbals, vitamins, non-steroidal anti-inflammatory drugs (NSAIDs) and supplements.  This website has more information on Eliquis (apixaban): http://www.eliquis.com/eliquis/home

## 2015-10-27 NOTE — Anesthesia Postprocedure Evaluation (Signed)
Anesthesia Post Note  Patient: Glen Bolton  Procedure(s) Performed: Procedure(s) (LRB): TOTAL HIP ARTHROPLASTY ANTERIOR APPROACH (Left)  Patient location during evaluation: PACU Anesthesia Type: Spinal Level of consciousness: oriented and awake and alert Pain management: pain level controlled Vital Signs Assessment: post-procedure vital signs reviewed and stable Respiratory status: spontaneous breathing, respiratory function stable and patient connected to nasal cannula oxygen Cardiovascular status: blood pressure returned to baseline and stable Postop Assessment: no headache, no backache and spinal receding Anesthetic complications: no    Last Vitals:  Filed Vitals:   10/27/15 1145 10/27/15 1200  BP: 121/69 119/80  Pulse: 86 77  Temp:  36.7 C  Resp: 15 19    Last Pain:  Filed Vitals:   10/27/15 1217  PainSc: 0-No pain                 [name]

## 2015-10-27 NOTE — Op Note (Signed)
OPERATIVE REPORT    DATE OF PROCEDURE:  10/27/2015       PREOPERATIVE DIAGNOSIS:  LEFT HIP OSTEOARTHRITIS                                                          POSTOPERATIVE DIAGNOSIS:  LEFT HIP OSTEOARTHRITIS                                                           PROCEDURE: Anterior L total hip arthroplasty using a 52 mm DePuy Pinnacle  Cup, [employer], 0-degree polyethylene liner, a +1.5 mm ceramic head, a 4 Depuy Triloc stem   SURGEON: Pleas Carneal Bolton    ASSISTANT:   Eric K. Barton Dubois  (present throughout entire procedure and necessary for timely completion of the procedure)   ANESTHESIA: Spinal BLOOD LOSS: 400 FLUID REPLACEMENT: 1600 crystalloid Antibiotic: 2gm ancef Tranexamic Acid: 2gm topical COMPLICATIONS: none    INDICATIONS FOR PROCEDURE: A 75 y.o. year-old With  LEFT HIP OSTEOARTHRITIS   for 3 years, x-rays show bone-on-bone arthritic changes, and osteophytes. Despite conservative measures with observation, anti-inflammatory medicine, narcotics, use of a cane, has severe unremitting pain and can ambulate only a few blocks before resting. Patient desires elective L total hip arthroplasty to decrease pain and increase function. The risks, benefits, and alternatives were discussed at length including but not limited to the risks of infection, bleeding, nerve injury, stiffness, blood clots, the need for revision surgery, cardiopulmonary complications, among others, and they were willing to proceed. Questions answered     PROCEDURE IN DETAIL: The patient was identified by armband,  received preoperative IV antibiotics in the holding area at [facility], taken to the operating room , appropriate anesthetic monitors  were attached and  anesthesia was induced with the patienton the gurney. The HANA boots were applied to the feet and he was then transferred to the HANA table with a peroneal post and support underneath the non-operative le, which was  locked in 5 lb traction. Theoperative lower extremity was then prepped and draped in the usual sterile fashion from just above the iliac crest to the knee. And a timeout procedure was performed. We then made a 12 cm incision along the interval at the leading edge of the tensor fascia lata of starting at 2 cm lateral to and 2 cm distal to the ASIS. Small bleeders in the skin and subcutaneous tissue identified and cauterized we dissected down to the fascia and made an incision in the fascia allowing [place] to elevate the fascia of the tensor muscle and exploited the interval between the rectus and the tensor fascia lata. A Hohmann retractor was then placed along the superior neck of the femur and a Cobra retractor along the inferior neck of the femur we teed the capsule starting out at the superior anterior aspect of the acetabulum going distally and made the T along the neck both leaflets of the T were tagged with #2 Ethibond suture. Cobra retractors were then placed along the inferior and superior neck allowing [place] to perform a standard neck cut and removed the femoral head  with a power corkscrew. We then placed a right angle Hohmann retractor along the anterior aspect of the acetabulum a spiked Cobra in the cotyloid notch and posteriorly a Muelller retractor. We then sequentially reamed up to a 51 mm basket reamer obtaining good coverage in all quadrants, verified by C-arm imaging. Under C-arm control with and hammered into place a 54 mm Pinnacle cup in 45 of abduction and 15 of anteversion. The cup seated nicely and required no supplemental screws. We then placed a central hole Eliminator and a 0 polyethylene liner. The foot was then externally rotated to 100, the HANA elevator was placed around the flare of the greater trochanter and the limb was extended and abducted delivering the proximal femur up into the wound. A medium Hohmann retractor was placed over the greater trochanter and a Mueller retractor along the  posterior femoral neck completing the exposure. We then performed releases superiorly and and inferiorly of the capsule going back to the pirformis fossa superiorly and to the lesser trochanter inferiorly. We then entered the proximal femur with the box cutting offset chisel followed by, a canal sounder, the chili pepper and broaching up to a 4 broach. This seated nicely and we reamed the calcar. A trial reduction was performed with a 1.5 mm 36 mm head.The limb lengths were excellent the hip was stable in 90 of external rotation. At this point the trial components removed and we hammered into place a # 4 Tri-Lock stem with Gryption coating. This was a hi offset stem and a + 1.5 mm ceramic ball was then hammered into place the hip was reduced and final C-arm images obtained. The wound was thoroughly irrigated with normal saline solution. We repaired the ant capsule and the tensor fascia lot a with running 0 vicryl suture. the subcutaneous tissue was closed with 2-0 and 3-0 Vicryl suture followed by an Aquacil dressing. At this point the patient was awaken and transferred to hospital gurney without difficulty. The subcutaneous tissue with 0 and 2-0 undyed Vicryl suture and the skin with running  3-0 vicryl subcuticular suture. Aquacil dressing was applied. The patient was then unclamped, rolled supine, awaken extubated and taken to recovery room without difficulty in stable condition.   Glen Bolton 10/27/2015, 10:09 AM

## 2015-10-27 NOTE — Anesthesia Procedure Notes (Addendum)
Spinal Patient location during procedure: OR Start time: 10/27/2015 7:35 AM End time: 10/27/2015 7:40 AM Staffing Anesthesiologist: Suella Broad D Performed by: anesthesiologist  Preanesthetic Checklist Completed: patient identified, site marked, surgical consent, pre-op evaluation, timeout performed, IV checked, risks and benefits discussed and monitors and equipment checked Spinal Block Patient position: sitting Prep: Betadine Patient monitoring: heart rate, continuous pulse ox, blood pressure and cardiac monitor Approach: midline Location: L4-5 Injection technique: single-shot Needle Needle type: Whitacre and Introducer  Needle gauge: 24 G Needle length: 9 cm Additional Notes Negative paresthesia. Negative blood return. Positive free-flowing CSF. Expiration date of kit checked and confirmed. Patient tolerated procedure well, without complications.    Procedure Name: MAC Date/Time: 10/27/2015 7:45 AM Performed by: Jenne Campus Pre-anesthesia Checklist: Patient identified, Emergency Drugs available, Suction available, Patient being monitored and Timeout performed Patient Re-evaluated:Patient Re-evaluated prior to inductionOxygen Delivery Method: Simple face mask

## 2015-10-27 NOTE — Clinical Social Work Note (Signed)
CSW received referral for SNF.  Case discussed with case manager and plan is to discharge home with home health.  CSW to sign off please re-consult if social work needs arise.  Nico Syme R. Olan Kurek, MSW, LCSWA [phone]  

## 2015-10-27 NOTE — Progress Notes (Signed)
Utilization review completed.  

## 2015-10-27 NOTE — Addendum Note (Signed)
Addendum  created 10/27/15 1430 by Jenne Campus, CRNA   Modules edited: Anesthesia Flowsheet

## 2015-10-28 ENCOUNTER — Encounter (HOSPITAL_COMMUNITY): Payer: Self-pay | Admitting: Orthopedic Surgery

## 2015-10-28 LAB — BASIC METABOLIC PANEL
ANION GAP: 9 (ref 5–15)
BUN: 15 mg/dL (ref 6–20)
CALCIUM: 8.2 mg/dL — AB (ref 8.9–10.3)
CO2: 22 mmol/L (ref 22–32)
Chloride: 106 mmol/L (ref 101–111)
Creatinine, Ser: 1.01 mg/dL (ref 0.61–1.24)
GLUCOSE: 157 mg/dL — AB (ref 65–99)
POTASSIUM: 3.9 mmol/L (ref 3.5–5.1)
SODIUM: 137 mmol/L (ref 135–145)

## 2015-10-28 LAB — CBC
HCT: 39.6 % (ref 39.0–52.0)
Hemoglobin: 12.8 g/dL — ABNORMAL LOW (ref 13.0–17.0)
MCH: 29.4 pg (ref 26.0–34.0)
MCHC: 32.3 g/dL (ref 30.0–36.0)
MCV: 91 fL (ref 78.0–100.0)
Platelets: 150 10*3/uL (ref 150–400)
RBC: 4.35 MIL/uL (ref 4.22–5.81)
RDW: 13.3 % (ref 11.5–15.5)
WBC: 9.8 10*3/uL (ref 4.0–10.5)

## 2015-10-28 MED ORDER — OXYCODONE-ACETAMINOPHEN 5-325 MG PO TABS: 1.0000 | ORAL_TABLET | ORAL | Status: DC | PRN

## 2015-10-28 MED ORDER — APIXABAN 5 MG PO TABS
5.0000 mg | ORAL_TABLET | Freq: Two times a day (BID) | ORAL | Status: DC
  Administered 2015-10-28 – 2015-10-29 (×3): 5 mg via ORAL
  Filled 2015-10-28 (×3): qty 1

## 2015-10-28 NOTE — Progress Notes (Signed)
Physical Therapy Treatment Patient Details Name: Glen Bolton MRN: [id number] DOB: 1941/02/25 Today's Date: 10/28/2015    History of Present Illness Pt is a 75 y/o M s/p Lt THA.  Pt's PMH includes a-fib, use of hearing aids, SOB, Rt rotator cuff surgery.    PT Comments    Patient is progressing toward goals and tolerated session well. Continue to progress as tolerated.   Follow Up Recommendations        Equipment Recommendations       Recommendations for Other Services       Precautions / Restrictions Precautions Precautions: Fall Precaution Comments: no orders including hip precautions, direct anterior? Restrictions Weight Bearing Restrictions: Yes LLE Weight Bearing: Weight bearing as tolerated    Mobility  Bed Mobility               General bed mobility comments: in chair upon arrival  Transfers Overall transfer level: Needs assistance Equipment used: Rolling walker (2 wheeled) Transfers: Sit to/from Stand Sit to Stand: Supervision         General transfer comment: carry over of safe hand placement and technique; supervision for safety  Ambulation/Gait Ambulation/Gait assistance: Supervision Ambulation Distance (Feet): 200 Feet Assistive device: Rolling walker (2 wheeled) Gait Pattern/deviations: Step-to pattern;Step-through pattern;Decreased stance time - left;Decreased step length - right;Antalgic;Trunk flexed   Gait velocity interpretation: Below normal speed for age/gender General Gait Details: cues for upright posture and encouragement to increae WB on L LE; pt with improved bilat step length and ability to WB on L LE after initial ambulation   Stairs Stairs: Yes Stairs assistance: Min guard Stair Management: No rails;Forwards;With walker Number of Stairs: 1 General stair comments: pt educated on sequencing and demonstrated good safety awareness with no unsteadiness  Wheelchair Mobility    Modified Rankin (Stroke Patients Only)        Balance Overall balance assessment: Needs assistance Sitting-balance support: No upper extremity supported;Feet supported Sitting balance-Leahy Scale: Good     Standing balance support: Bilateral upper extremity supported Standing balance-Leahy Scale: Fair Standing balance comment: Able to maintain balance without UE support for few moments to complete grooming tasks at sink                    Cognition Arousal/Alertness: Awake/alert Behavior During Therapy: WFL for tasks assessed/performed Overall Cognitive Status: Within Functional Limits for tasks assessed                      Exercises Total Joint Exercises Heel Slides: AROM;Left;10 reps;Seated Hip ABduction/ADduction: AROM;10 reps;Seated Long Arc Quad: AROM;Left;10 reps;Seated    General Comments        Pertinent Vitals/Pain Pain Assessment: 0-10 Pain Score: 3  Pain Location: L thigh with mobility Pain Descriptors / Indicators: Sore Pain Intervention(s): Monitored during session;Premedicated before session;Repositioned    Home Living Family/patient expects to be discharged to:: Private residence Living Arrangements: Spouse/significant other;Other relatives (Mother-in-law and sister-in-law) Available Help at Discharge: Family;Available 24 hours/day (Wife works 11-3, mother-in-law provide supervision) Type of Home: House Home Access: Stairs to enter Entrance Stairs-Rails: None Home Layout: One level Home Equipment: Environmental consultant - 2 wheels;Wheelchair - manual;Hand held shower head;Shower seat Additional Comments: Mother-in-law uses rollator and can only provide supervision level assist. Wife works from 11-5    Prior Function Level of Independence: Independent          PT Goals (current goals can now be found in the care plan section) Acute Rehab PT Goals Patient Stated Goal:  to go home Progress towards PT goals: Progressing toward goals    Frequency       PT Plan Current plan remains appropriate     Co-evaluation             End of Session Equipment Utilized During Treatment: Gait belt Activity Tolerance: Patient tolerated treatment well Patient left: in chair;with call bell/phone within reach;with SCD's reapplied     Time: [id number] PT Time Calculation (min) (ACUTE ONLY): 30 min  Charges:  $Gait Training: 8-22 mins $Therapeutic Exercise: 8-22 mins                    G Codes:      Salina April, PTA Pager: [phone]   10/28/2015, 1:16 PM

## 2015-10-28 NOTE — Progress Notes (Signed)
Occupational Therapy Evaluation/Discharge Patient Details Name: Glen Bolton MRN: [id number] DOB: 05-30-1941 Today's Date: 10/28/2015    History of Present Illness Pt is a 75 y/o M s/p Lt THA.  Pt's PMH includes a-fib, use of hearing aids, SOB, Rt rotator cuff surgery.   Clinical Impression   PTA, pt was independent with all ADLs and mobility. Pt currently requires supervision for mobility and min assist for LB ADLs which pt's wife can provide per pt report. Educated pt on compensatory strategies for ADLs and transfers, fall prevention strategies, and pain/edema managements strategies. All education has been completed and pt has no further questions. Pt plans to d/c home with 24/7 supervision and intermittent assistance from family. Pt adequate for discharge from occupational therapy standpoint. Pt with no further acute OT needs. OT signing off.    Follow Up Recommendations  No OT follow up;Supervision - Intermittent    Equipment Recommendations  3 in 1 bedside comode    Recommendations for Other Services       Precautions / Restrictions Precautions Precautions: Fall Precaution Comments: no orders including hip precautions, direct anterior? Restrictions Weight Bearing Restrictions: Yes LLE Weight Bearing: Weight bearing as tolerated      Mobility Bed Mobility               General bed mobility comments: Pt up in chair on OT arrival  Transfers Overall transfer level: Needs assistance Equipment used: Rolling walker (2 wheeled) Transfers: Sit to/from Stand Sit to Stand: Supervision         General transfer comment: Supervision for safety. Good demonstration of safe hand placement on seated surfaces.    Balance Overall balance assessment: Needs assistance Sitting-balance support: No upper extremity supported;Feet supported Sitting balance-Leahy Scale: Fair     Standing balance support: Bilateral upper extremity supported;During functional activity Standing  balance-Leahy Scale: Fair Standing balance comment: Able to maintain balance without UE support for few moments to complete grooming tasks at sink                            ADL Overall ADL's : Needs assistance/impaired     Grooming: Wash/dry hands;Supervision/safety;Standing           Upper Body Dressing : Supervision/safety;Sitting   Lower Body Dressing: Minimal assistance;Sit to/from stand;Cueing for compensatory techniques Lower Body Dressing Details (indicate cue type and reason): Unable to reach bilateral feet - wife can assist, cues to dress LLE first and undress it last Toilet Transfer: Supervision/safety;Ambulation;RW;BSC;Cueing for safety Toilet Transfer Details (indicate cue type and reason): BSC over toilet, cues to feel BSC on back of legs before reaching back to sit Toileting- Clothing Manipulation and Hygiene: Supervision/safety;Sit to/from stand   Tub/ Shower Transfer: Walk-in shower;Supervision/safety;Cueing for sequencing;Ambulation;Rolling walker Tub/Shower Transfer Details (indicate cue type and reason): cues for proper step sequence with RW Functional mobility during ADLs: Supervision/safety;Rolling walker General ADL Comments: Practiced transfers and compensatory strategies for ADLs. Educated pt on fall prevention, energy conservation, and pain/edema management strategies.     Vision Vision Assessment?: No apparent visual deficits   Perception     Praxis      Pertinent Vitals/Pain Pain Assessment: 0-10 Pain Score: 2  Pain Location: L thigh Pain Descriptors / Indicators: Aching Pain Intervention(s): Limited activity within patient's tolerance;Monitored during session;Repositioned     Hand Dominance Right   Extremity/Trunk Assessment Upper Extremity Assessment Upper Extremity Assessment: Overall WFL for tasks assessed   Lower Extremity Assessment Lower Extremity Assessment:  LLE deficits/detail LLE Deficits / Details: limited ROM and  strength as expected s/p Lt THA LLE: Unable to fully assess due to pain   Cervical / Trunk Assessment Cervical / Trunk Assessment: Kyphotic   Communication Communication Communication: HOH (has bilateral hearing aids)   Cognition Arousal/Alertness: Awake/alert Behavior During Therapy: WFL for tasks assessed/performed Overall Cognitive Status: Within Functional Limits for tasks assessed                     General Comments       Exercises       Shoulder Instructions      Home Living Family/patient expects to be discharged to:: Private residence Living Arrangements: Spouse/significant other;Other relatives (Mother-in-law and sister-in-law) Available Help at Discharge: Family;Available 24 hours/day (Wife works 11-3, mother-in-law provide supervision) Type of Home: House Home Access: Stairs to enter Technical brewer of Steps: 1 Entrance Stairs-Rails: None Home Layout: One level     Bathroom Shower/Tub: Walk-in shower;Door   [employer] Toilet: Standard Bathroom Accessibility: Yes How Accessible: Accessible via walker Home Equipment: Walker - 2 wheels;Wheelchair - manual;Hand held shower head;Shower seat   Additional Comments: Mother-in-law uses rollator and can only provide supervision level assist. Wife works from 11-5      Prior Functioning/Environment Level of Independence: Independent             OT Diagnosis: Acute pain   OT Problem List: Decreased strength;Decreased range of motion;Decreased activity tolerance;Impaired balance (sitting and/or standing);Decreased coordination;Decreased safety awareness;Decreased knowledge of use of DME or AE;Decreased knowledge of precautions;Pain   OT Treatment/Interventions:      OT Goals(Current goals can be found in the care plan section) Acute Rehab OT Goals Patient Stated Goal: to go home OT Goal Formulation: With patient Time For Goal Achievement: 11/11/15 Potential to Achieve Goals: Good  OT Frequency:      Barriers to D/C:            Co-evaluation              End of Session Equipment Utilized During Treatment: Gait belt;Rolling walker Nurse Communication: Mobility status  Activity Tolerance: Patient tolerated treatment well Patient left: in chair;with call bell/phone within reach   Time: [id number] OT Time Calculation (min): 20 min Charges:  OT General Charges $OT Visit: 1 Procedure OT Evaluation $OT Eval Moderate Complexity: 1 Procedure G-Codes:    Redmond Baseman, OTR/L [phone] 10/28/2015, 1:00 PM

## 2015-10-28 NOTE — Care Management Note (Signed)
Case Management Note  Patient Details  Name: Glen Bolton MRN: [id number] Date of Birth: Jun 18, 1941  Subjective/Objective:        S/p left total hip arthroplasty            Action/Plan: Set up with [facility] for HHPT by MD office. Spoke with patient, no change in discharge plan. Patient stated that he has a rolling walker, needs 3N1. Contacted Jermaine at [facility] and requested 3N1 be delivered to patient's room. Patient stated that his wife will be assisting him after discharge.      Expected Discharge Date:    10/29/15              Expected Discharge Plan:  [place]  In-House Referral:  NA  Discharge planning Services  CM Consult  Post Acute Care Choice:  Durable Medical Equipment, Home Health Choice offered to:  Patient  DME Arranged:  3-N-1 DME Agency:  [place]:  PT [place]:  [place]  Status of Service:  Completed, signed off  Medicare Important Message Given:    Date Medicare IM Given:    Medicare IM give by:    Date Additional Medicare IM Given:    Additional Medicare Important Message give by:     If discussed at [place] of Stay Meetings, dates discussed:    Additional Comments:  Nila Nephew, RN 10/28/2015, 2:45 PM

## 2015-10-28 NOTE — Progress Notes (Signed)
Patient ID: Glen Bolton, male   DOB: [birth year], 75 y.o.   MRN: [id number] PATIENT ID: Glen Bolton  MRN: [id number]  DOB/AGE:  Jan 20, 1941 / 75 y.o.  1 Day Post-Op Procedure(s) (LRB): TOTAL HIP ARTHROPLASTY ANTERIOR APPROACH (Left)    PROGRESS NOTE Subjective: Patient is alert, oriented, no Nausea, no Vomiting, yes passing gas, . Taking PO well. Denies SOB, Chest or Calf Pain. Using Incentive Spirometer, PAS in place. Ambulate in Hallway Patient reports pain as  3/10  .    Objective: Vital signs in last 24 hours: Filed Vitals:   10/27/15 1400 10/27/15 1500 10/27/15 2109 10/28/15 0623  BP: 130/87  122/73 125/87  Pulse: 97   81  Temp:  97.8 F (36.6 C) 98.3 F (36.8 C) 98.4 F (36.9 C)  TempSrc:   Oral   Resp: 22  19 18   Height:      Weight:      SpO2: 98%  94% 95%      Intake/Output from previous day: I/O last 3 completed shifts: In: 3360 [P.O.:360; I.V.:3000] Out: 600 [Urine:250; Blood:350]   Intake/Output this shift:     LABORATORY DATA:  Recent Labs  10/27/15 0625  INR 1.17    Examination: Neurologically intact ABD soft Neurovascular intact Sensation intact distally Intact pulses distally Dorsiflexion/Plantar flexion intact Incision: dressing C/D/I No cellulitis present Compartment soft} XR AP&Lat of hip shows well placed\fixed THA  Assessment:   1 Day Post-Op Procedure(s) (LRB): TOTAL HIP ARTHROPLASTY ANTERIOR APPROACH (Left) ADDITIONAL DIAGNOSIS:  Expected Acute Blood Loss Anemia, Cardiac Arrythmia afib  Plan: PT/OT WBAT, THA  DVT Prophylaxis: SCDx72 hrs, resume apixaban 5mg  bid DISCHARGE PLAN: Home, prob tomorro  DISCHARGE NEEDS: HHPT, Walker and 3-in-1 comode seat

## 2015-10-28 NOTE — Progress Notes (Signed)
Physical Therapy Treatment Patient Details Name: Glen Bolton MRN: [id number] DOB: 1941/05/11 Today's Date: 10/28/2015    History of Present Illness Pt is a 75 y/o M s/p Lt THA.  Pt's PMH includes a-fib, use of hearing aids, SOB, Rt rotator cuff surgery.    PT Comments    Patient tolerated standing therex well and demonstrated improved gait mechanics this session. Current plan remains appropriate.   Follow Up Recommendations  Home health PT;Supervision - Intermittent     Equipment Recommendations  3in1 (PT)    Recommendations for Other Services OT consult     Precautions / Restrictions Precautions Precautions: Fall Precaution Comments: no orders including hip precautions, direct anterior? Restrictions Weight Bearing Restrictions: Yes LLE Weight Bearing: Weight bearing as tolerated    Mobility  Bed Mobility Overal bed mobility: Needs Assistance Bed Mobility: Sit to Supine       Sit to supine: Min assist   General bed mobility comments: in chair upon arrival; assist to bring L LE into bed; vc for technique with no use of bedrails  Transfers Overall transfer level: Needs assistance Equipment used: Rolling walker (2 wheeled) Transfers: Sit to/from Stand Sit to Stand: Supervision         General transfer comment: safe hand placement and technique  Ambulation/Gait Ambulation/Gait assistance: Supervision Ambulation Distance (Feet): 150 Feet Assistive device: Rolling walker (2 wheeled) Gait Pattern/deviations: Step-through pattern;Trunk flexed   Gait velocity interpretation: Below normal speed for age/gender General Gait Details: pt demonstrated increased WB on L LE and with improved symmetrical step lengths; safe position of RW this session; vc for upright posture    Stairs Stairs: Yes Stairs assistance: Min guard Stair Management: No rails;Forwards;With walker Number of Stairs: 1 General stair comments: pt educated on sequencing and demonstrated good  safety awareness with no unsteadiness  Wheelchair Mobility    Modified Rankin (Stroke Patients Only)       Balance Overall balance assessment: Needs assistance Sitting-balance support: No upper extremity supported;Feet supported Sitting balance-Leahy Scale: Good     Standing balance support: Bilateral upper extremity supported Standing balance-Leahy Scale: Fair Standing balance comment: Able to maintain balance without UE support for few moments to complete grooming tasks at sink                    Cognition Arousal/Alertness: Awake/alert Behavior During Therapy: WFL for tasks assessed/performed Overall Cognitive Status: Within Functional Limits for tasks assessed                      Exercises Total Joint Exercises Heel Slides: AROM;Left;10 reps;Seated Hip ABduction/ADduction: AROM;10 reps;Standing;Left Long Arc Quad: AROM;Left;10 reps;Seated Knee Flexion: AROM;Left;10 reps;Standing Marching in Standing: AROM;Both;10 reps;Standing    General Comments        Pertinent Vitals/Pain Pain Assessment: 0-10 Pain Score: 2  Pain Location: L thigh Pain Descriptors / Indicators: Sore Pain Intervention(s): Monitored during session;Repositioned    Home Living Family/patient expects to be discharged to:: Private residence Living Arrangements: Spouse/significant other;Other relatives (Mother-in-law and sister-in-law) Available Help at Discharge: Family;Available 24 hours/day (Wife works 11-3, mother-in-law provide supervision) Type of Home: House Home Access: Stairs to enter Entrance Stairs-Rails: None Home Layout: One level Home Equipment: Environmental consultant - 2 wheels;Wheelchair - manual;Hand held shower head;Shower seat Additional Comments: Mother-in-law uses rollator and can only provide supervision level assist. Wife works from 11-5    Prior Function Level of Independence: Independent          PT Goals (current goals can  now be found in the care plan section)  Acute Rehab PT Goals Patient Stated Goal: to go home Progress towards PT goals: Progressing toward goals    Frequency  7X/week    PT Plan Current plan remains appropriate    Co-evaluation             End of Session Equipment Utilized During Treatment: Gait belt Activity Tolerance: Patient tolerated treatment well Patient left: with call bell/phone within reach;in bed     Time: [id number] PT Time Calculation (min) (ACUTE ONLY): 25 min  Charges:  $Gait Training: 8-22 mins $Therapeutic Exercise: 8-22 mins                    G Codes:      Salina April, PTA Pager: [phone]   10/28/2015, 2:18 PM

## 2015-10-29 LAB — CBC
HCT: 35.5 % — ABNORMAL LOW (ref 39.0–52.0)
Hemoglobin: 11.8 g/dL — ABNORMAL LOW (ref 13.0–17.0)
MCH: 30.5 pg (ref 26.0–34.0)
MCHC: 33.2 g/dL (ref 30.0–36.0)
MCV: 91.7 fL (ref 78.0–100.0)
Platelets: 145 10*3/uL — ABNORMAL LOW (ref 150–400)
RBC: 3.87 MIL/uL — AB (ref 4.22–5.81)
RDW: 13.5 % (ref 11.5–15.5)
WBC: 12.3 10*3/uL — AB (ref 4.0–10.5)

## 2015-10-29 NOTE — Progress Notes (Signed)
PATIENT ID: Glen Bolton  MRN: [id number]  DOB/AGE:  74-Dec-1942 / 75 y.o.  2 Days Post-Op Procedure(s) (LRB): TOTAL HIP ARTHROPLASTY ANTERIOR APPROACH (Left)    PROGRESS NOTE Subjective: Patient is alert, oriented, no Nausea, no Vomiting, yes passing gas, . Taking PO well. Denies SOB, Chest or Calf Pain. Using Incentive Spirometer, PAS in place. Ambulate WBAT with pt walking 200 ft with therapy Patient reports pain as  Mild at rest, but increasing with therapy .    Objective: Vital signs in last 24 hours: Filed Vitals:   10/28/15 0623 10/28/15 1330 10/28/15 2027 10/29/15 0444  BP: 125/87 108/61 142/82 114/50  Pulse: 81 86 98 94  Temp: 98.4 F (36.9 C) 98.5 F (36.9 C) 98.5 F (36.9 C) 97.4 F (36.3 C)  TempSrc:  Oral Oral   Resp: 18 18 18 18   Height:      Weight:      SpO2: 95% 95% 97% 96%      Intake/Output from previous day: I/O last 3 completed shifts: In: 4920.8 [P.O.:1200; I.V.:3720.8] Out: -    Intake/Output this shift:     LABORATORY DATA:  Recent Labs  10/27/15 0625 10/28/15 0739  WBC  --  9.8  HGB  --  12.8*  HCT  --  39.6  PLT  --  150  NA  --  137  K  --  3.9  CL  --  106  CO2  --  22  BUN  --  15  CREATININE  --  1.01  GLUCOSE  --  157*  INR 1.17  --   CALCIUM  --  8.2*    Examination: Neurologically intact Neurovascular intact Sensation intact distally Intact pulses distally Dorsiflexion/Plantar flexion intact Incision: dressing C/D/I and scant drainage No cellulitis present Compartment soft} XR AP&Lat of hip shows well placed\fixed THA  Assessment:   2 Days Post-Op Procedure(s) (LRB): TOTAL HIP ARTHROPLASTY ANTERIOR APPROACH (Left) ADDITIONAL DIAGNOSIS:  Expected Acute Blood Loss Anemia, Afib  Plan: PT/OT WBAT, THA  DVT Prophylaxis: SCDx72 hrs, Pt has restarted his normal Eliquis dose  DISCHARGE PLAN: Home  DISCHARGE NEEDS: HHPT, Walker and 3-in-1 comode seat

## 2015-10-29 NOTE — Discharge Summary (Signed)
Patient ID: Glen Bolton MRN: [id number] DOB/AGE: 1941-07-19 75 y.o.  Admit date: 10/27/2015 Discharge date: 10/29/2015  Admission Diagnoses:  Principal Problem:   Primary osteoarthritis of left hip   Discharge Diagnoses:  Same  Past Medical History  Diagnosis Date  . Hypertension   . GERD (gastroesophageal reflux disease)   . Glaucoma   . History of stomach ulcers   . History of gastritis   . Dysrhythmia   . Atrial fibrillation ([place])   . Hard of hearing   . Shortness of breath dyspnea     with exertion  . History of kidney stones   . Nocturia   . Hx of migraines     no migraines since he was in his 50's  . Arthritis     Surgeries: Procedure(s): TOTAL HIP ARTHROPLASTY ANTERIOR APPROACH on 10/27/2015   Consultants:    Discharged Condition: Improved  Hospital Course: Jarriel Schlueter is an 75 y.o. male who was admitted 10/27/2015 for operative treatment ofPrimary osteoarthritis of left hip. Patient has severe unremitting pain that affects sleep, daily activities, and work/hobbies. After pre-op clearance the patient was taken to the operating room on 10/27/2015 and underwent  Procedure(s): TOTAL HIP ARTHROPLASTY ANTERIOR APPROACH.    Patient was given perioperative antibiotics: Anti-infectives    Start     Dose/Rate Route Frequency Ordered Stop   10/27/15 0700  ceFAZolin (ANCEF) 3 g in dextrose 5 % 50 mL IVPB     3 g 130 mL/hr over 30 Minutes Intravenous To [facility] 10/24/15 1408 10/27/15 0745       Patient was given sequential compression devices, early ambulation, and chemoprophylaxis to prevent DVT.  Patient benefited maximally from hospital stay and there were no complications.    Recent vital signs: Patient Vitals for the past 24 hrs:  BP Temp Temp src Pulse Resp SpO2  10/29/15 0444 (!) 114/50 mmHg 97.4 F (36.3 C) - 94 18 96 %  10/28/15 2027 (!) 142/82 mmHg 98.5 F (36.9 C) Oral 98 18 97 %  10/28/15 1330 108/61 mmHg 98.5 F (36.9 C) Oral 86 18 95 %      Recent laboratory studies:  Recent Labs  10/27/15 0625 10/28/15 0739  WBC  --  9.8  HGB  --  12.8*  HCT  --  39.6  PLT  --  150  NA  --  137  K  --  3.9  CL  --  106  CO2  --  22  BUN  --  15  CREATININE  --  1.01  GLUCOSE  --  157*  INR 1.17  --   CALCIUM  --  8.2*     Discharge Medications:     Medication List    TAKE these medications        amLODipine-benazepril 10-20 MG capsule  Commonly known as:  LOTREL  Take 1 capsule by mouth daily.     bimatoprost 0.01 % Soln  Commonly known as:  LUMIGAN  Place 1 drop into both eyes at bedtime.     dorzolamide 2 % ophthalmic solution  Commonly known as:  TRUSOPT  Place 1 drop into both eyes 2 (two) times daily.     ELIQUIS 5 MG Tabs tablet  Generic drug:  apixaban  Take 5 mg by mouth 2 (two) times daily.     methocarbamol 500 MG tablet  Commonly known as:  ROBAXIN  TAKE 1 TABLET BY MOUTH EVERY 6 HOURS AS NEEDED FOR SPASMS  metoprolol succinate 50 MG 24 hr tablet  Commonly known as:  TOPROL-XL  Take 1 tablet (50 mg total) by mouth daily. Take with or immediately following a meal.     MULTIVITAMIN & MINERAL PO  Take 1 tablet by mouth daily.     omeprazole 20 MG capsule  Commonly known as:  PRILOSEC  Take 20 mg by mouth daily.     oxyCODONE-acetaminophen 5-325 MG tablet  Commonly known as:  ROXICET  Take 1 tablet by mouth every 4 (four) hours as needed for severe pain.     PROBIOTIC PO  Take 1 tablet by mouth daily.        Diagnostic Studies: Dg Chest 2 View  10/16/2015  CLINICAL DATA:  Preoperative evaluation for upcoming hip replacement EXAM: CHEST  2 VIEW COMPARISON:  None. FINDINGS: Cardiac shadow is within normal limits. The lungs are clear bilaterally. No acute infiltrate or sizable effusion is noted. Mild degenerative changes of the thoracic spine are seen. IMPRESSION: No acute abnormality noted. Electronically Signed   By: Inez Catalina M.D.   On: 10/16/2015 09:57   Dg Hip Operative Unilat  With Pelvis Left  10/27/2015  CLINICAL DATA:  Left total hip replacement EXAM: OPERATIVE LEFT HIP (WITH PELVIS IF PERFORMED) 2 VIEWS TECHNIQUE: Fluoroscopic spot image(s) were submitted for interpretation post-operatively. COMPARISON:  None. FINDINGS: Intraoperative spot imaging demonstrates changes of left hip replacement. Normal AP alignment. No visible hardware or bony complicating feature. IMPRESSION: Left hip replacement without visible complicating feature. Electronically Signed   By: Rolm Baptise M.D.   On: 10/27/2015 10:07    Disposition: Final discharge disposition not confirmed      Discharge Instructions    Call MD / Call 911    Complete by:  As directed   If you experience chest pain or shortness of breath, CALL 911 and be transported to the hospital emergency room.  If you develope a fever above 101 F, pus (white drainage) or increased drainage or redness at the wound, or calf pain, call your surgeon's office.     Change dressing    Complete by:  As directed   You may change your dressing on day 5, then change the dressing daily with sterile 4 x 4 inch gauze dressing and paper tape.  You may clean the incision with alcohol prior to redressing     Constipation Prevention    Complete by:  As directed   Drink plenty of fluids.  Prune juice may be helpful.  You may use a stool softener, such as Colace (over the counter) 100 mg twice a day.  Use MiraLax (over the counter) for constipation as needed.     Diet - low sodium heart healthy    Complete by:  As directed      Driving restrictions    Complete by:  As directed   No driving for 2 weeks     Follow the hip precautions as taught in Physical Therapy    Complete by:  As directed      Increase activity slowly as tolerated    Complete by:  As directed      Patient may shower    Complete by:  As directed   You may shower without a dressing once there is no drainage.  Do not wash over the wound.  If drainage remains, cover wound with  plastic wrap and then shower.           Follow-up Information  Follow up with Kerin Salen, MD In 2 weeks.   Specialty:  Orthopedic Surgery   Contact information:   [place] [address] [phone]       Follow up with [facility].   Why:  They will contact you to schedule home therapy visits.   Contact information:   [address] [place] [address] [place] [id number] [phone]        Signed: Hardin Negus, Delano Frate R 10/29/2015, 8:07 AM

## 2015-10-29 NOTE — Progress Notes (Signed)
Physical Therapy Treatment Patient Details Name: Glen Bolton MRN: [id number] DOB: [birth year] Today's Date: 10/29/2015    History of Present Illness Pt is a 75 y/o M s/p Lt THA.  Pt's PMH includes a-fib, use of hearing aids, SOB, Rt rotator cuff surgery.    PT Comments    Patient continues to do well with PT. Needs to practice bed mobility next session.   Follow Up Recommendations  Home health PT;Supervision - Intermittent     Equipment Recommendations  3in1 (PT)    Recommendations for Other Services OT consult     Precautions / Restrictions Precautions Precautions: Fall Precaution Comments: no orders including hip precautions, direct anterior? Restrictions Weight Bearing Restrictions: Yes LLE Weight Bearing: Weight bearing as tolerated    Mobility  Bed Mobility Overal bed mobility: Needs Assistance Bed Mobility: Supine to Sit     Supine to sit: Mod assist     General bed mobility comments: assistance to elevate trunk into sitting and bring L LE to EOB; HOB flat and min use of bed rails  Transfers Overall transfer level: Needs assistance Equipment used: Rolling walker (2 wheeled) Transfers: Sit to/from Stand Sit to Stand: Supervision         General transfer comment: safe hand placement and technique; supervision for safety  Ambulation/Gait Ambulation/Gait assistance: Supervision Ambulation Distance (Feet): 200 Feet Assistive device: Rolling walker (2 wheeled) Gait Pattern/deviations: Step-through pattern;Decreased stride length;Decreased stance time - left;Trunk flexed   Gait velocity interpretation: Below normal speed for age/gender General Gait Details: vc for posture and facilitation of L heel strike; pt with improved gait mechanics after inital steps but maintains flexed trunk   Stairs Stairs: Yes Stairs assistance: Min guard Stair Management: No rails;Forwards;With walker   General stair comments: carry over of technique with good safety  awareness; min guard for safety  Wheelchair Mobility    Modified Rankin (Stroke Patients Only)       Balance Overall balance assessment: Needs assistance Sitting-balance support: No upper extremity supported;Feet supported Sitting balance-Leahy Scale: Good     Standing balance support: Bilateral upper extremity supported Standing balance-Leahy Scale: Fair                      Cognition Arousal/Alertness: Awake/alert Behavior During Therapy: WFL for tasks assessed/performed Overall Cognitive Status: Within Functional Limits for tasks assessed                      Exercises Total Joint Exercises Heel Slides: AROM;Left;10 reps;Seated Hip ABduction/ADduction: AROM;10 reps;Left;Seated    General Comments        Pertinent Vitals/Pain Pain Assessment: Faces Faces Pain Scale: Hurts a little bit Pain Location: L thigh Pain Descriptors / Indicators: Guarding;Sore Pain Intervention(s): Monitored during session;Premedicated before session;Repositioned    Home Living                      Prior Function            PT Goals (current goals can now be found in the care plan section) Acute Rehab PT Goals Patient Stated Goal: to go home Progress towards PT goals: Progressing toward goals    Frequency  7X/week    PT Plan Current plan remains appropriate    Co-evaluation             End of Session Equipment Utilized During Treatment: Gait belt Activity Tolerance: Patient tolerated treatment well Patient left: with call bell/phone within reach;in chair;with family/visitor  present     Time: 1005-1025 PT Time Calculation (min) (ACUTE ONLY): 20 min  Charges:  $Gait Training: 8-22 mins                    G Codes:      Glen Bolton, PTA Pager: [phone]   10/29/2015, 12:31 PM

## 2015-10-30 DIAGNOSIS — M1990 Unspecified osteoarthritis, unspecified site: Secondary | ICD-10-CM | POA: Diagnosis not present

## 2015-10-30 DIAGNOSIS — H9193 Unspecified hearing loss, bilateral: Secondary | ICD-10-CM | POA: Diagnosis not present

## 2015-10-30 DIAGNOSIS — Z471 Aftercare following joint replacement surgery: Secondary | ICD-10-CM | POA: Diagnosis not present

## 2015-10-30 DIAGNOSIS — Z96642 Presence of left artificial hip joint: Secondary | ICD-10-CM | POA: Diagnosis not present

## 2015-10-30 DIAGNOSIS — I10 Essential (primary) hypertension: Secondary | ICD-10-CM | POA: Diagnosis not present

## 2015-10-30 DIAGNOSIS — H409 Unspecified glaucoma: Secondary | ICD-10-CM | POA: Diagnosis not present

## 2015-11-03 DIAGNOSIS — Z471 Aftercare following joint replacement surgery: Secondary | ICD-10-CM | POA: Diagnosis not present

## 2015-11-03 DIAGNOSIS — I10 Essential (primary) hypertension: Secondary | ICD-10-CM | POA: Diagnosis not present

## 2015-11-03 DIAGNOSIS — M1990 Unspecified osteoarthritis, unspecified site: Secondary | ICD-10-CM | POA: Diagnosis not present

## 2015-11-03 DIAGNOSIS — Z96642 Presence of left artificial hip joint: Secondary | ICD-10-CM | POA: Diagnosis not present

## 2015-11-03 DIAGNOSIS — H409 Unspecified glaucoma: Secondary | ICD-10-CM | POA: Diagnosis not present

## 2015-11-03 DIAGNOSIS — H9193 Unspecified hearing loss, bilateral: Secondary | ICD-10-CM | POA: Diagnosis not present

## 2015-11-05 DIAGNOSIS — I10 Essential (primary) hypertension: Secondary | ICD-10-CM | POA: Diagnosis not present

## 2015-11-05 DIAGNOSIS — H9193 Unspecified hearing loss, bilateral: Secondary | ICD-10-CM | POA: Diagnosis not present

## 2015-11-05 DIAGNOSIS — M1990 Unspecified osteoarthritis, unspecified site: Secondary | ICD-10-CM | POA: Diagnosis not present

## 2015-11-05 DIAGNOSIS — Z471 Aftercare following joint replacement surgery: Secondary | ICD-10-CM | POA: Diagnosis not present

## 2015-11-05 DIAGNOSIS — H409 Unspecified glaucoma: Secondary | ICD-10-CM | POA: Diagnosis not present

## 2015-11-05 DIAGNOSIS — Z96642 Presence of left artificial hip joint: Secondary | ICD-10-CM | POA: Diagnosis not present

## 2015-11-06 NOTE — Sleep Study (Signed)
Patient Name: Glen Bolton, Glen Bolton Date: 10/23/2015 Gender: Male D.O.B: Jul 05, 1941 Age (years): 68 Referring Provider: Minus Breeding Height (inches): 74 Interpreting Physician: Shelva Majestic MD, ABSM Weight (lbs): 250 RPSGT: Gerhard Perches BMI: 36 MRN: [id number] Neck Size: 18.00  CLINICAL INFORMATION Sleep Study Type: NPSG Indication for sleep study: OSA, Snoring, atrial fibrillation Epworth Sleepiness Score: 11  SLEEP STUDY TECHNIQUE As per the AASM Manual for the Scoring of Sleep and Associated Events v2.3 (April 2016) with a hypopnea requiring 4% desaturations. The channels recorded and monitored were frontal, central and occipital EEG, electrooculogram (EOG), submentalis EMG (chin), nasal and oral airflow, thoracic and abdominal wall motion, anterior tibialis EMG, snore microphone, electrocardiogram, and pulse oximetry.  MEDICATIONS  amLODipine-benazepril (LOTREL) 10-20 MG per capsule 1 capsule, Daily     apixaban (ELIQUIS) 5 MG TABS tablet 5 mg, 2 times daily     bimatoprost (LUMIGAN) 0.01 % SOLN 1 drop, Daily at bedtime     dorzolamide (TRUSOPT) 2 % ophthalmic solution 1 drop, 2 times daily     methocarbamol (ROBAXIN) 500 MG tablet      metoprolol succinate (TOPROL-XL) 50 MG 24 hr tablet 50 mg, Daily     Multiple Vitamins-Minerals (MULTIVITAMIN & MINERAL PO) 1 tablet, Daily     omeprazole (PRILOSEC) 20 MG capsule 20 mg, Daily     oxyCODONE-acetaminophen (ROXICET) 5-325 MG tablet 1 tablet, Every 4 hours PRN     Probiotic Product (PROBIOTIC     Medications self-administered by patient during sleep study : No sleep medicine administered.  SLEEP ARCHITECTURE The study was initiated at 10:37:04 PM and ended at 5:10:22 AM. Sleep onset time was 6.5 minutes and the sleep efficiency was 64.4%. The total sleep time was 253.3 minutes. Wake after sleep onset (WASO) ws 133.5 minutes Stage REM latency was 130.5 minutes. The patient spent 14.61% of the night in stage N1 sleep,  68.41% in stage N2 sleep, 0.00% in stage N3 and 16.98% in REM. Alpha intrusion was absent. Supine sleep was 55.97%.  RESPIRATORY PARAMETERS The overall apnea/hypopnea index (AHI) was 18.2 per hour. There were 28 total apneas, including 6 obstructive, 22 central and 0 mixed apneas. There were 49 hypopneas and 12 RERAs. The AHI during Stage REM sleep was 8.4 per hour. AHI while supine was 21.6 per hour. The mean oxygen saturation was 91.29%. The minimum SpO2 during sleep was 79.00%. Loud snoring was noted during this study.  CARDIAC DATA The 2 lead EKG demonstrated atrial fibrillation.  There were rare PVCs. The mean heart rate was 79.17 beats per minute.   LEG MOVEMENT DATA The total PLMS were 20 with a resulting PLMS index of 4.74. Associated arousal with leg movement index was 0.2 .  IMPRESSIONS - Moderate obstructive sleep apnea with an overall AHI = 18.2/h.  Events were worse with supine sleep with an AHI of 21.6/ hour.  An attempt at CPAP titration was initiated during this study, but the patient aborted the titration attempt after only 12 minutes. - Mild central sleep apnea occurred during this study (CAI = 5.2/h). - Reduced sleep efficiency at 64.4%. - Abnormal sleep architecture with absence of slow-wave sleep and prolonged latency to REM sleep. - Significant oxygen desaturation to a nadir of 79.00%. - The patient snored with Loud snoring volume. - The patient was in atrial fibrillation for the duration of the study.  There were occasional PVCs. - Clinically significant periodic limb movements did not occur during sleep. No significant associated arousals.  DIAGNOSIS - Obstructive Sleep  Apnea (327.23 [G47.33 ICD-10]) - Central Sleep Apnea (327.27 [G47.37 ICD-10]) - Nocturnal Hypoxemia (327.26 [G47.36 ICD-10])  RECOMMENDATIONS - CPAP titration to determine optimal pressure required to alleviate sleep disordered breathing. BiPAP or ASV titration may be required to eliminate  central sleep apnea. - Avoid alcohol, sedatives and other CNS depressants that may worsen sleep apnea and disrupt normal sleep architecture. - Efforts should be made to reduce supine sleep - Sleep hygiene should be reviewed to assess factors that may improve sleep quality. - Weight management (BMI 36) and regular exercise should be initiated.   Troy Sine, MD, [place], American Board of Sleep Medicine  ELECTRONICALLY SIGNED ON:  11/06/2015, 6:01 PM [place] PH: [phone]   FX: [phone] [place]

## 2015-11-07 DIAGNOSIS — Z96642 Presence of left artificial hip joint: Secondary | ICD-10-CM | POA: Diagnosis not present

## 2015-11-07 DIAGNOSIS — M1990 Unspecified osteoarthritis, unspecified site: Secondary | ICD-10-CM | POA: Diagnosis not present

## 2015-11-07 DIAGNOSIS — Z471 Aftercare following joint replacement surgery: Secondary | ICD-10-CM | POA: Diagnosis not present

## 2015-11-07 DIAGNOSIS — H409 Unspecified glaucoma: Secondary | ICD-10-CM | POA: Diagnosis not present

## 2015-11-07 DIAGNOSIS — I10 Essential (primary) hypertension: Secondary | ICD-10-CM | POA: Diagnosis not present

## 2015-11-07 DIAGNOSIS — H9193 Unspecified hearing loss, bilateral: Secondary | ICD-10-CM | POA: Diagnosis not present

## 2015-11-10 DIAGNOSIS — I10 Essential (primary) hypertension: Secondary | ICD-10-CM | POA: Diagnosis not present

## 2015-11-10 DIAGNOSIS — H409 Unspecified glaucoma: Secondary | ICD-10-CM | POA: Diagnosis not present

## 2015-11-10 DIAGNOSIS — M1990 Unspecified osteoarthritis, unspecified site: Secondary | ICD-10-CM | POA: Diagnosis not present

## 2015-11-10 DIAGNOSIS — H9193 Unspecified hearing loss, bilateral: Secondary | ICD-10-CM | POA: Diagnosis not present

## 2015-11-10 DIAGNOSIS — Z471 Aftercare following joint replacement surgery: Secondary | ICD-10-CM | POA: Diagnosis not present

## 2015-11-10 DIAGNOSIS — Z96642 Presence of left artificial hip joint: Secondary | ICD-10-CM | POA: Diagnosis not present

## 2015-11-10 NOTE — Addendum Note (Signed)
Addended by: Andres Ege on: 11/10/2015 03:55 PM   Modules accepted: Orders

## 2015-11-11 ENCOUNTER — Telehealth: Payer: Self-pay | Admitting: Cardiology

## 2015-11-11 DIAGNOSIS — M25552 Pain in left hip: Secondary | ICD-10-CM | POA: Diagnosis not present

## 2015-11-11 DIAGNOSIS — Z471 Aftercare following joint replacement surgery: Secondary | ICD-10-CM | POA: Diagnosis not present

## 2015-11-11 DIAGNOSIS — Z96642 Presence of left artificial hip joint: Secondary | ICD-10-CM | POA: Diagnosis not present

## 2015-11-11 DIAGNOSIS — M1612 Unilateral primary osteoarthritis, left hip: Secondary | ICD-10-CM | POA: Diagnosis not present

## 2015-11-11 DIAGNOSIS — R262 Difficulty in walking, not elsewhere classified: Secondary | ICD-10-CM | POA: Diagnosis not present

## 2015-11-11 MED ORDER — APIXABAN 5 MG PO TABS: 5.0000 mg | ORAL_TABLET | Freq: Two times a day (BID) | ORAL | Status: DC

## 2015-11-11 NOTE — Telephone Encounter (Signed)
Returned pt call, aware samples available, he will pick up tomorrow.

## 2015-11-11 NOTE — Telephone Encounter (Signed)
Patient calling the office for samples of medication:   1.  What medication and dosage are you requesting samples for? Eliquis  2.  Are you currently out of this medication? yes    

## 2015-11-14 DIAGNOSIS — R262 Difficulty in walking, not elsewhere classified: Secondary | ICD-10-CM | POA: Diagnosis not present

## 2015-11-14 DIAGNOSIS — M25552 Pain in left hip: Secondary | ICD-10-CM | POA: Diagnosis not present

## 2015-11-14 DIAGNOSIS — Z471 Aftercare following joint replacement surgery: Secondary | ICD-10-CM | POA: Diagnosis not present

## 2015-11-14 DIAGNOSIS — Z96642 Presence of left artificial hip joint: Secondary | ICD-10-CM | POA: Diagnosis not present

## 2015-11-17 ENCOUNTER — Telehealth: Payer: Self-pay | Admitting: *Deleted

## 2015-11-17 DIAGNOSIS — M25552 Pain in left hip: Secondary | ICD-10-CM | POA: Diagnosis not present

## 2015-11-17 DIAGNOSIS — Z96642 Presence of left artificial hip joint: Secondary | ICD-10-CM | POA: Diagnosis not present

## 2015-11-17 DIAGNOSIS — Z471 Aftercare following joint replacement surgery: Secondary | ICD-10-CM | POA: Diagnosis not present

## 2015-11-17 DIAGNOSIS — R262 Difficulty in walking, not elsewhere classified: Secondary | ICD-10-CM | POA: Diagnosis not present

## 2015-11-17 NOTE — Telephone Encounter (Signed)
Called patient to inform him of his sleep study results per Dr. Claiborne Billings.    He stated verbal understanding and is okay to proceed with CPAP titration.   His insurance does not require precert, so I will call and schedule. Will also mail patient a letter with the titration date.  He was understanding and appreciative of the call.

## 2015-11-18 ENCOUNTER — Encounter: Payer: Self-pay | Admitting: *Deleted

## 2015-11-20 DIAGNOSIS — Z96642 Presence of left artificial hip joint: Secondary | ICD-10-CM | POA: Diagnosis not present

## 2015-11-20 DIAGNOSIS — Z471 Aftercare following joint replacement surgery: Secondary | ICD-10-CM | POA: Diagnosis not present

## 2015-11-20 DIAGNOSIS — R262 Difficulty in walking, not elsewhere classified: Secondary | ICD-10-CM | POA: Diagnosis not present

## 2015-11-20 DIAGNOSIS — M25552 Pain in left hip: Secondary | ICD-10-CM | POA: Diagnosis not present

## 2015-11-24 DIAGNOSIS — Z471 Aftercare following joint replacement surgery: Secondary | ICD-10-CM | POA: Diagnosis not present

## 2015-11-24 DIAGNOSIS — R262 Difficulty in walking, not elsewhere classified: Secondary | ICD-10-CM | POA: Diagnosis not present

## 2015-11-24 DIAGNOSIS — Z96642 Presence of left artificial hip joint: Secondary | ICD-10-CM | POA: Diagnosis not present

## 2015-11-24 DIAGNOSIS — M25552 Pain in left hip: Secondary | ICD-10-CM | POA: Diagnosis not present

## 2015-11-27 DIAGNOSIS — M25552 Pain in left hip: Secondary | ICD-10-CM | POA: Diagnosis not present

## 2015-11-27 DIAGNOSIS — M1712 Unilateral primary osteoarthritis, left knee: Secondary | ICD-10-CM | POA: Diagnosis not present

## 2015-11-27 DIAGNOSIS — M1711 Unilateral primary osteoarthritis, right knee: Secondary | ICD-10-CM | POA: Diagnosis not present

## 2015-11-28 DIAGNOSIS — I4891 Unspecified atrial fibrillation: Secondary | ICD-10-CM | POA: Diagnosis not present

## 2015-11-28 DIAGNOSIS — R600 Localized edema: Secondary | ICD-10-CM | POA: Diagnosis not present

## 2015-11-28 DIAGNOSIS — Z79899 Other long term (current) drug therapy: Secondary | ICD-10-CM | POA: Diagnosis not present

## 2015-11-28 DIAGNOSIS — I10 Essential (primary) hypertension: Secondary | ICD-10-CM | POA: Diagnosis not present

## 2015-12-01 DIAGNOSIS — M25552 Pain in left hip: Secondary | ICD-10-CM | POA: Diagnosis not present

## 2015-12-01 DIAGNOSIS — Z471 Aftercare following joint replacement surgery: Secondary | ICD-10-CM | POA: Diagnosis not present

## 2015-12-01 DIAGNOSIS — R262 Difficulty in walking, not elsewhere classified: Secondary | ICD-10-CM | POA: Diagnosis not present

## 2015-12-01 DIAGNOSIS — Z96642 Presence of left artificial hip joint: Secondary | ICD-10-CM | POA: Diagnosis not present

## 2015-12-04 DIAGNOSIS — M25552 Pain in left hip: Secondary | ICD-10-CM | POA: Diagnosis not present

## 2015-12-04 DIAGNOSIS — R262 Difficulty in walking, not elsewhere classified: Secondary | ICD-10-CM | POA: Diagnosis not present

## 2015-12-04 DIAGNOSIS — Z96642 Presence of left artificial hip joint: Secondary | ICD-10-CM | POA: Diagnosis not present

## 2015-12-04 DIAGNOSIS — Z741 Need for assistance with personal care: Secondary | ICD-10-CM | POA: Diagnosis not present

## 2015-12-09 DIAGNOSIS — Z79899 Other long term (current) drug therapy: Secondary | ICD-10-CM | POA: Diagnosis not present

## 2015-12-10 DIAGNOSIS — Z96642 Presence of left artificial hip joint: Secondary | ICD-10-CM | POA: Diagnosis not present

## 2015-12-10 DIAGNOSIS — Z471 Aftercare following joint replacement surgery: Secondary | ICD-10-CM | POA: Diagnosis not present

## 2015-12-10 DIAGNOSIS — M25552 Pain in left hip: Secondary | ICD-10-CM | POA: Diagnosis not present

## 2015-12-10 DIAGNOSIS — R262 Difficulty in walking, not elsewhere classified: Secondary | ICD-10-CM | POA: Diagnosis not present

## 2015-12-11 ENCOUNTER — Ambulatory Visit (INDEPENDENT_AMBULATORY_CARE_PROVIDER_SITE_OTHER): Payer: Medicare Other | Admitting: Podiatry

## 2015-12-11 ENCOUNTER — Encounter: Payer: Self-pay | Admitting: Podiatry

## 2015-12-11 VITALS — BP 127/81 | HR 87 | Resp 14

## 2015-12-11 DIAGNOSIS — B351 Tinea unguium: Secondary | ICD-10-CM | POA: Diagnosis not present

## 2015-12-11 DIAGNOSIS — M25552 Pain in left hip: Secondary | ICD-10-CM | POA: Diagnosis not present

## 2015-12-11 DIAGNOSIS — M25562 Pain in left knee: Secondary | ICD-10-CM | POA: Diagnosis not present

## 2015-12-11 DIAGNOSIS — M25551 Pain in right hip: Secondary | ICD-10-CM | POA: Diagnosis not present

## 2015-12-11 NOTE — Progress Notes (Signed)
Subjective:     Patient ID: Glen Bolton, male   DOB: 1941/06/03, 75 y.o.   MRN: [id number]  HPIhis patient returns to the office follow-up for nail surgery on his right big toe. He had an I&D performed by Dr. Gershon Mussel about 3 months ago. He was then seen in my office for nail care are in November. At that time we decided to have him return in January to evaluate the need for possible permanent nail surgery.  He says he has been golfing and has had no pain since his November visit. He presents the office for an evaluation and treatment.  He returns again for evaluation of his right big toe nail which has no incurvation or pain.  He is now going to nail salon.   Review of Systems     Objective:   Physical Exam GENERAL APPEARANCE: Alert, conversant. Appropriately groomed. No acute distress.  VASCULAR: Pedal pulses palpable at  [facility] and PT bilateral.  Capillary refill time is immediate to all digits,  Normal temperature gradient.  Digital hair growth is present bilateral  NEUROLOGIC: sensation is normal to 5.07 monofilament at 5/5 sites bilateral.  Light touch is intact bilateral, Muscle strength normal.  MUSCULOSKELETAL: acceptable muscle strength, tone and stability bilateral.  Intrinsic muscluature intact bilateral.  Rectus appearance of foot and digits noted bilateral.   DERMATOLOGIC: skin color, texture, and turgor are within normal limits.  No preulcerative lesions or ulcers  are seen, no interdigital maceration noted.  No open lesions present.  Digital nails are asymptomatic. No drainage noted.      Assessment:     Onychomycosis right hallux     Plan:     Debride right hallux nail.  After discussion, we decided to postpone his surgery until pain returns. I also suggested to let the nail salon work on his pain free nail.   RTC prn   Gardiner Barefoot DPM

## 2015-12-11 NOTE — Addendum Note (Signed)
Addended by: Ezzard Flax, Mescal Flinchbaugh L on: 12/11/2015 09:54 AM   Modules accepted: Orders, Medications

## 2015-12-12 DIAGNOSIS — Z471 Aftercare following joint replacement surgery: Secondary | ICD-10-CM | POA: Diagnosis not present

## 2015-12-12 DIAGNOSIS — Z96642 Presence of left artificial hip joint: Secondary | ICD-10-CM | POA: Diagnosis not present

## 2015-12-12 DIAGNOSIS — M25552 Pain in left hip: Secondary | ICD-10-CM | POA: Diagnosis not present

## 2015-12-12 DIAGNOSIS — R262 Difficulty in walking, not elsewhere classified: Secondary | ICD-10-CM | POA: Diagnosis not present

## 2015-12-15 DIAGNOSIS — R262 Difficulty in walking, not elsewhere classified: Secondary | ICD-10-CM | POA: Diagnosis not present

## 2015-12-15 DIAGNOSIS — Z96642 Presence of left artificial hip joint: Secondary | ICD-10-CM | POA: Diagnosis not present

## 2015-12-15 DIAGNOSIS — Z471 Aftercare following joint replacement surgery: Secondary | ICD-10-CM | POA: Diagnosis not present

## 2015-12-15 DIAGNOSIS — M25552 Pain in left hip: Secondary | ICD-10-CM | POA: Diagnosis not present

## 2015-12-24 DIAGNOSIS — Z96642 Presence of left artificial hip joint: Secondary | ICD-10-CM | POA: Diagnosis not present

## 2015-12-24 DIAGNOSIS — R262 Difficulty in walking, not elsewhere classified: Secondary | ICD-10-CM | POA: Diagnosis not present

## 2015-12-24 DIAGNOSIS — M25552 Pain in left hip: Secondary | ICD-10-CM | POA: Diagnosis not present

## 2015-12-24 DIAGNOSIS — Z471 Aftercare following joint replacement surgery: Secondary | ICD-10-CM | POA: Diagnosis not present

## 2015-12-26 ENCOUNTER — Telehealth: Payer: Self-pay | Admitting: Cardiology

## 2015-12-26 NOTE — Telephone Encounter (Signed)
Three options  1.  See if he qualifies for patient assistance - has to have spent 3% of his annual income on prescriptions so far this year ($300 for every $10,000 income) - eyedrops may have put him to that point.    2.  Send rx for Xarelto to pharmacy and see if it is a preferred drug with a lower copay  3.  Switch to warfarin and come in for regular INR checks.

## 2015-12-26 NOTE — Telephone Encounter (Signed)
New message       Pt c/o medication issue:  1. Name of Medication: eliquis 2. How are you currently taking this medication (dosage and times per day)? 5mg s  3. Are you having a reaction (difficulty breathing--STAT)? no 4. What is your medication issue?  Pt cannot afford eliquis--200.00 per month with ins.  Is there something else he can take?

## 2015-12-26 NOTE — Telephone Encounter (Signed)
Returned call to speak w/ this patient. He has been provided w/ samples of the Eliquis and thus far has not had to pay for meds. Notes that inquiry to his Rx coverage showed the total cost of this med at $~500 a month, of which they would pay $~300.  He is left w/ the $200 responsibility. Trying to ascertain whether he needs to meet deductible requirements for his plan - he is not sure, but notes he is already paying $45 each for 3 eyedrop prescriptions per month. By the time he pays for all his medications, he's worried he won't have enough money to buy groceries. Pt aware I will defer to [facility] for assistance on this matter.

## 2015-12-26 NOTE — Telephone Encounter (Signed)
Options discussed. Pt voiced understanding.  Suggested the patient assistance option as a starting point to which he was agreeable. States he is not at home right now so will have to speak about this later - he will call back to get information.   Note to direct him to [email or web address] for Eliquis patient assistance.

## 2015-12-29 NOTE — Telephone Encounter (Signed)
Spoke with pt, web site for eliquis assistance given to the pt. He is going to check the web site and then let us know what option he needs.

## 2015-12-29 NOTE — Telephone Encounter (Signed)
PT is calling about the options that he has with getting Eliquis . He is stating that he may have to go with the Generic form . Please call   Thanks

## 2015-12-31 DIAGNOSIS — M25552 Pain in left hip: Secondary | ICD-10-CM | POA: Diagnosis not present

## 2015-12-31 DIAGNOSIS — R262 Difficulty in walking, not elsewhere classified: Secondary | ICD-10-CM | POA: Diagnosis not present

## 2015-12-31 DIAGNOSIS — Z96642 Presence of left artificial hip joint: Secondary | ICD-10-CM | POA: Diagnosis not present

## 2015-12-31 DIAGNOSIS — Z471 Aftercare following joint replacement surgery: Secondary | ICD-10-CM | POA: Diagnosis not present

## 2016-01-07 DIAGNOSIS — Z96642 Presence of left artificial hip joint: Secondary | ICD-10-CM | POA: Diagnosis not present

## 2016-01-07 DIAGNOSIS — R262 Difficulty in walking, not elsewhere classified: Secondary | ICD-10-CM | POA: Diagnosis not present

## 2016-01-07 DIAGNOSIS — Z471 Aftercare following joint replacement surgery: Secondary | ICD-10-CM | POA: Diagnosis not present

## 2016-01-07 DIAGNOSIS — M25552 Pain in left hip: Secondary | ICD-10-CM | POA: Diagnosis not present

## 2016-01-12 ENCOUNTER — Ambulatory Visit (HOSPITAL_BASED_OUTPATIENT_CLINIC_OR_DEPARTMENT_OTHER): Payer: Medicare Other | Attending: Cardiovascular Disease | Admitting: Cardiovascular Disease

## 2016-01-12 DIAGNOSIS — I4891 Unspecified atrial fibrillation: Secondary | ICD-10-CM | POA: Diagnosis not present

## 2016-01-12 DIAGNOSIS — Z7901 Long term (current) use of anticoagulants: Secondary | ICD-10-CM | POA: Diagnosis not present

## 2016-01-12 DIAGNOSIS — R0683 Snoring: Secondary | ICD-10-CM

## 2016-01-12 DIAGNOSIS — G4730 Sleep apnea, unspecified: Secondary | ICD-10-CM | POA: Diagnosis not present

## 2016-01-12 DIAGNOSIS — G4733 Obstructive sleep apnea (adult) (pediatric): Secondary | ICD-10-CM | POA: Insufficient documentation

## 2016-01-12 DIAGNOSIS — I493 Ventricular premature depolarization: Secondary | ICD-10-CM | POA: Diagnosis not present

## 2016-01-12 DIAGNOSIS — Z79899 Other long term (current) drug therapy: Secondary | ICD-10-CM | POA: Insufficient documentation

## 2016-01-20 ENCOUNTER — Encounter (HOSPITAL_BASED_OUTPATIENT_CLINIC_OR_DEPARTMENT_OTHER): Payer: Self-pay | Admitting: Cardiovascular Disease

## 2016-01-20 NOTE — Procedures (Signed)
Patient Name: Glen Bolton, Glen Bolton Date: 01/12/2016 Gender: Male D.O.B: 30-Nov-1940 Age (years): 5 Referring Provider: Minus Breeding Height (inches): 70 Interpreting Physician: Shelva Majestic MD, ABSM Weight (lbs): 250 RPSGT: Madelon Lips BMI: 36 MRN: [id number] Neck Size: 18.00  CLINICAL INFORMATION The patient is referred for a CPAP titration to treat sleep apnea. Date of NPSG, Split Night or HST: 10/23/15; AHI 18.2; RDI 21.1  SLEEP STUDY TECHNIQUE As per the AASM Manual for the Scoring of Sleep and Associated Events v2.3 (April 2016) with a hypopnea requiring 4% desaturations. The channels recorded and monitored were frontal, central and occipital EEG, electrooculogram (EOG), submentalis EMG (chin), nasal and oral airflow, thoracic and abdominal wall motion, anterior tibialis EMG, snore microphone, electrocardiogram, and pulse oximetry. Continuous positive airway pressure (CPAP) was initiated at the beginning of the study and titrated to treat sleep-disordered breathing.  MEDICATIONS  amLODipine (NORVASC) 10 MG tablet 5 mg, Daily     Note: Received from: External Pharmacy (Written 12/11/2015 0906)   amLODipine-benazepril (LOTREL) 10-20 MG per capsule 1 capsule, Daily     apixaban (ELIQUIS) 5 MG TABS tablet 5 mg, 2 times daily     bimatoprost (LUMIGAN) 0.01 % SOLN 1 drop, Daily at bedtime     dorzolamide (TRUSOPT) 2 % ophthalmic solution 1 drop, 2 times daily     hydrochlorothiazide (HYDRODIURIL) 12.5 MG tablet      Note: Received from: External Pharmacy (Written 12/11/2015 0906)   methocarbamol (ROBAXIN) 500 MG tablet      metoprolol succinate (TOPROL-XL) 50 MG 24 hr tablet 50 mg, Daily     Multiple Vitamins-Minerals (MULTIVITAMIN & MINERAL PO) 1 tablet, Daily     Probiotic Product (PROBIOTIC PO) 1 tablet, Daily     valsartan (DIOVAN) 80 MG tablet   Medications administered by patient during sleep study : No sleep medicine administered.  TECHNICIAN COMMENTS Comments added  by technician: Patient was restless all through the night. Patient talked in his/her sleep.  Comments added by scorer: N/A  RESPIRATORY PARAMETERS Optimal PAP Pressure (cm): 13 AHI at Optimal Pressure (/hr): 2.6 Overall Minimal O2 (%): 87.00 Supine % at Optimal Pressure (%): 5 Minimal O2 at Optimal Pressure (%): 90.0      SLEEP ARCHITECTURE The study was initiated at 11:14:07 PM and ended at 5:15:23 AM. Sleep onset time was 4.0 minutes and the sleep efficiency was 67.5%. The total sleep time was 244.0 minutes. Wake after sleep onset (WASO) was 113.3 minutes. The patient spent 11.89% of the night in stage N1 sleep, 60.04% in stage N2 sleep, 0.00% in stage N3 and 28.07% in REM.Stage REM latency was 153.5 minutes Wake after sleep onset was 113.3. Alpha intrusion was absent. Supine sleep was 29.07%.  CARDIAC DATA The 2 lead EKG demonstrated sinus rhythm. The mean heart rate was 73.64 beats per minute. Other EKG findings include: Atrial Fibrillation and PVCs.  LEG MOVEMENT DATA The total Periodic Limb Movements of Sleep (PLMS) were 0. The PLMS index was 0.00. A PLMS index of <15 is considered normal in adults.  IMPRESSIONS - The optimal PAP pressure was 13 cm of water. - Central sleep apnea was not noted during this titration (CAI = 2.2/h). - Mild oxygen desaturations were observed during this titration to a nadir of 87.00% at 7 cm water pressure. - Abnormal sleep architecture with absence of Stage 3 sleep and prolonged latency to REM sleep. - The patient snored with Loud snoring volume during this titration study. - 2-lead EKG demonstrated: Atrial fibrillation  and PVC's. - Clinically significant periodic limb movements were not noted during this study. Arousals associated with PLMs were rare.  DIAGNOSIS - Obstructive Sleep Apnea (327.23 [G47.33 ICD-10])  RECOMMENDATIONS - Recommend an initial trial of CPAP with EPR of 3 at 13 cm H2O with a Medium size [employer]ips Respironics Full Face Mask  [place] View mask and heated humidification. - Avoid alcohol, sedatives and other CNS depressants that may worsen sleep apnea and disrupt normal sleep architecture. - Sleep hygiene should be reviewed to assess factors that may improve sleep quality. - Weight management and regular exercise should be initiated or continued. - Recommend a download be obtained in 30 days and sleep clinic evaluation.   Troy Sine, MD, [place], American Board of Sleep Medicine  ELECTRONICALLY SIGNED ON:  01/20/2016, 7:00 PM [place] PH: [phone]   FX: [phone] [place]

## 2016-01-21 ENCOUNTER — Other Ambulatory Visit: Payer: Self-pay | Admitting: *Deleted

## 2016-01-21 ENCOUNTER — Telehealth: Payer: Self-pay | Admitting: *Deleted

## 2016-01-21 DIAGNOSIS — G4733 Obstructive sleep apnea (adult) (pediatric): Secondary | ICD-10-CM

## 2016-01-21 NOTE — Progress Notes (Signed)
CPAP referral sent to [facility].

## 2016-01-21 NOTE — Telephone Encounter (Signed)
Called and notified patient of sleep study results and recommendations. Referral sent to  [facility]. Patient voice understanding.

## 2016-01-21 NOTE — Telephone Encounter (Signed)
CPAP referral[facility].

## 2016-01-23 ENCOUNTER — Telehealth: Payer: Self-pay | Admitting: Cardiology

## 2016-01-23 MED ORDER — RIVAROXABAN 20 MG PO TABS: 20.0000 mg | ORAL_TABLET | Freq: Every day | ORAL | Status: DC

## 2016-01-23 NOTE — Telephone Encounter (Signed)
New message      The pt calling concerning the high cost of Elquis, pt can not afford the Eliquis but pt states he can afford the Pradaxa cause of the cost of the co-pay.     Pt wants to get the medication switched if possible

## 2016-01-23 NOTE — Telephone Encounter (Signed)
Spoke with Patient who states prescription insurance is [employer]. Xarelto and Pradaxa are preferred on this plan. Will send RX for Xarelto. Discussed directions for him to transition to Xarelto from Eliquis. He will call if this medication is also not affordable to him. He has about a week supply of Eliquis, instructed him that it is fine for him to finish it prior to transitioning to Xarelto.

## 2016-01-23 NOTE — Telephone Encounter (Signed)
Discussed with Jacklynn Ganong D

## 2016-01-28 DIAGNOSIS — Z96642 Presence of left artificial hip joint: Secondary | ICD-10-CM | POA: Diagnosis not present

## 2016-02-20 ENCOUNTER — Telehealth: Payer: Self-pay | Admitting: Cardiology

## 2016-02-20 NOTE — Telephone Encounter (Signed)
Spoke to patient. Called regarding a medication he was given samples for but realized this was provided by a different medical office. He was calling for samples and will contact the other provider. Voiced thanks for the call.

## 2016-02-20 NOTE — Telephone Encounter (Signed)
New Message  Pt requested to speak w/ RN about a OTC medication he has been using. Please call back and discuss.

## 2016-02-26 DIAGNOSIS — Z125 Encounter for screening for malignant neoplasm of prostate: Secondary | ICD-10-CM | POA: Diagnosis not present

## 2016-02-26 DIAGNOSIS — Z136 Encounter for screening for cardiovascular disorders: Secondary | ICD-10-CM | POA: Diagnosis not present

## 2016-02-26 DIAGNOSIS — I4891 Unspecified atrial fibrillation: Secondary | ICD-10-CM | POA: Diagnosis not present

## 2016-02-26 DIAGNOSIS — I10 Essential (primary) hypertension: Secondary | ICD-10-CM | POA: Diagnosis not present

## 2016-02-26 DIAGNOSIS — Z0001 Encounter for general adult medical examination with abnormal findings: Secondary | ICD-10-CM | POA: Diagnosis not present

## 2016-02-26 DIAGNOSIS — Z79899 Other long term (current) drug therapy: Secondary | ICD-10-CM | POA: Diagnosis not present

## 2016-02-26 DIAGNOSIS — I8390 Asymptomatic varicose veins of unspecified lower extremity: Secondary | ICD-10-CM | POA: Diagnosis not present

## 2016-03-04 ENCOUNTER — Encounter: Payer: Self-pay | Admitting: Cardiovascular Disease

## 2016-04-02 DIAGNOSIS — H6121 Impacted cerumen, right ear: Secondary | ICD-10-CM | POA: Diagnosis not present

## 2016-04-12 ENCOUNTER — Telehealth: Payer: Self-pay | Admitting: Cardiovascular Disease

## 2016-04-12 NOTE — Telephone Encounter (Signed)
Need to resched Wed appt.

## 2016-04-14 ENCOUNTER — Ambulatory Visit: Payer: Medicare Other | Admitting: Cardiovascular Disease

## 2016-04-14 NOTE — Telephone Encounter (Signed)
Follow up   Pt verbalized that he did not want his appt canceled for  04/14/16. I gave him first available appt, and added him to the wit list. Pt stated that he needs sooner appt for his insurance to cover and allow him to continue care from the C-PAP.

## 2016-04-16 NOTE — Telephone Encounter (Signed)
Called patient, no answer. LMTCB. Per my understanding of request, the patient is wanting a sleep clinic appt & had cancelled the 8/9 appt (unclear for what reason) and sched for October - requesting something sooner.   He is on a waitlist for sooner appt, but it does not appear there are days for sooner sleep clinic appt so we would have to check into other options. Will route to Dr. Claiborne Billings for any recommendations.

## 2016-04-16 NOTE — Telephone Encounter (Signed)
Follow up   Pt only wants AM appt for sleep  Pt said he has not had rn call him back pertaining to the first call 04/14/16

## 2016-05-13 ENCOUNTER — Encounter (INDEPENDENT_AMBULATORY_CARE_PROVIDER_SITE_OTHER): Payer: Self-pay

## 2016-05-13 ENCOUNTER — Encounter: Payer: Self-pay | Admitting: Cardiovascular Disease

## 2016-05-13 ENCOUNTER — Ambulatory Visit (INDEPENDENT_AMBULATORY_CARE_PROVIDER_SITE_OTHER): Payer: Medicare Other | Admitting: Cardiovascular Disease

## 2016-05-13 VITALS — BP 140/92 | HR 78 | Ht 70.0 in | Wt 262.2 lb

## 2016-05-13 DIAGNOSIS — G4733 Obstructive sleep apnea (adult) (pediatric): Secondary | ICD-10-CM | POA: Diagnosis not present

## 2016-05-13 DIAGNOSIS — I10 Essential (primary) hypertension: Secondary | ICD-10-CM | POA: Diagnosis not present

## 2016-05-13 DIAGNOSIS — I4821 Permanent atrial fibrillation: Secondary | ICD-10-CM

## 2016-05-13 DIAGNOSIS — I482 Chronic atrial fibrillation: Secondary | ICD-10-CM

## 2016-05-13 NOTE — Patient Instructions (Signed)
Your physician wants you to follow-up in: 1 year or sooner if needed in sleep clinic. You will receive a reminder letter in the mail two months in advance. If you don't receive a letter, please call our office to schedule the follow-up appointment.

## 2016-05-15 NOTE — Progress Notes (Signed)
ID:  Glen Bolton, DOB [birth year], MRN [id number]  PCP:  Lujean Amel, MD                    Cardiologist:   Minus Breeding, MD   HPI: Glen Bolton is a 75 y.o. male who presents to sleep clinic following initiation of CPAP therapy.  Glen Bolton followed by Dr. Percival Spanish for cardiology care and has a history of atrial fibrillation, hypertension, and GERD.  Due to concerns for obstructive sleep apnea.  He was referred for diagnostic polysomnogram which was done on 11/20/2015.  This confirmed moderate obstructive sleep apnea with an overall AHI of 18.2 per hour.  Events were worse with supine sleep with an HI of 21.6 per hour.  He had significant oxygen desaturation to 79%.  There was reduced sleep efficiency of only 64.4%.  He had loud snoring.  He was in atrial fibrillation for the duration of the study and there were occasional PVCs.  He ultimately underwent a complete CPAP titration study on 01/12/2016 and CPAP was titrated up to 13 cm water pressure.  He has been on CPAP therapy since June.  His DME [facility].  A download from June 29 through 04/02/2016 revealed 83% of days with usage and 70% of days with usage greater than 4 hours.  He was just meeting Medicare compliance standards.  His average usage per night was 5 hours and 26 minutes.  At this at pressure of 13 cm, AHI was excellent at 0.7 despite a significant mask leak at 45.1 L/m.  He has been using nasal pillows.  Typically goes to bed at 10 PM, but wakes up between 5 and 6 AM.  He has had difficulty with dry mouth as well as intermittent nasal congestion.  He feels that he has more energy since initiating CPAP therapy.  He is unaware of any breakthrough snoring.  An Epworth scale was calculated today and this endorsed at 4 and shown below arguing against residual daytime sleepiness.  Epworth Sleepiness Scale: Situation   Chance of Dozing/Sleeping (0 = never , 1 = slight chance , 2 = moderate chance , 3 = high chance  )   sitting and reading 1   watching TV 1   sitting inactive in a public place 0   being a passenger in a motor vehicle for an hour or more 0   lying down in the afternoon 1   sitting and talking to someone 0   sitting quietly after lunch (no alcohol) 1   while stopped for a few minutes in traffic as the driver 0   Total Score  4    Past Medical History:  Diagnosis Date  . Arthritis   . Atrial fibrillation ([place])   . Dysrhythmia   . GERD (gastroesophageal reflux disease)   . Glaucoma   . Hard of hearing   . History of gastritis   . History of kidney stones   . History of stomach ulcers   . Hx of migraines    no migraines since he was in his 47's  . Hypertension   . Nocturia   . Shortness of breath dyspnea    with exertion    Past Surgical History:  Procedure Laterality Date  . CATARACT EXTRACTION Bilateral   . COLONOSCOPY    . ESOPHAGOGASTRODUODENOSCOPY    . INGUINAL HERNIA REPAIR Left    x 2  . Rotator cuff surgery Right 2013  .  TOTAL HIP ARTHROPLASTY Left 10/27/2015   Procedure: TOTAL HIP ARTHROPLASTY ANTERIOR APPROACH;  Surgeon: Frederik Pear, MD;  Location: [facility];  Service: Orthopedics;  Laterality: Left;    No Known Allergies  Current Outpatient Prescriptions  Medication Sig Dispense Refill  . amLODipine (NORVASC) 10 MG tablet Take 5 mg by mouth daily.  0  . amLODipine-benazepril (LOTREL) 10-20 MG per capsule Take 1 capsule by mouth daily.    . bimatoprost (LUMIGAN) 0.01 % SOLN Place 1 drop into both eyes at bedtime.     . dorzolamide (TRUSOPT) 2 % ophthalmic solution Place 1 drop into both eyes 2 (two) times daily.     . hydrochlorothiazide (HYDRODIURIL) 12.5 MG tablet     . meloxicam (MOBIC) 15 MG tablet Take 1 tablet by mouth daily.    . methocarbamol (ROBAXIN) 500 MG tablet TAKE 1 TABLET BY MOUTH EVERY 6 HOURS AS NEEDED FOR SPASMS  0  . metoprolol succinate (TOPROL-XL) 50 MG 24 hr tablet Take 1 tablet (50 mg total) by mouth daily. Take with or immediately  following a meal. 90 tablet 3  . Multiple Vitamins-Minerals (MULTIVITAMIN & MINERAL PO) Take 1 tablet by mouth daily.    . Probiotic Product (PROBIOTIC PO) Take 1 tablet by mouth daily.     . rivaroxaban (XARELTO) 20 MG TABS tablet Take 1 tablet (20 mg total) by mouth daily with supper. 30 tablet 5  . valsartan (DIOVAN) 80 MG tablet      No current facility-administered medications for this visit.     Social History   Social History  . Marital status: Married    Spouse name: N/A  . Number of children: 2  . Years of education: N/A   Occupational History  . Not on file.   Social History Main Topics  . Smoking status: Never Smoker  . Smokeless tobacco: Never Used  . Alcohol use 0.6 oz/week    1 Glasses of wine per week     Comment: daily  . Drug use: No  . Sexual activity: Not on file   Other Topics Concern  . Not on file   Social History Narrative   [employer]   Additional social history is notable that is that he works part-time at [employer] course.  Family History  Problem Relation Age of Onset  . Hyperlipidemia Brother   . Stroke Sister 3     ROS General: Negative; No fevers, chills, or night sweats HEENT: Negative; No changes in vision or hearing, sinus congestion, difficulty swallowing Pulmonary: Negative; No cough, wheezing, shortness of breath, hemoptysis Cardiovascular: Negative; No chest pain, presyncope, syncope, palpatations GI: Negative; No nausea, vomiting, diarrhea, or abdominal pain GU: Negative; No dysuria, hematuria, or difficulty voiding Musculoskeletal: Negative; no myalgias, joint pain, or weakness Hematologic: Negative; no easy bruising, bleeding Endocrine: Negative; no heat/cold intolerance Neuro: Negative; no changes in balance, headaches Skin: Negative; No rashes or skin lesions Psychiatric: Negative; No behavioral problems, depression Sleep: see above; since initiating CPAP therapy no daytime sleepiness, hypersomnolence,  bruxism, restless legs, hypnogognic hallucinations, no cataplexy   Physical Exam BP (!) 140/92 (BP Location: Left Arm, Patient Position: Sitting, Cuff Size: Normal)   Pulse 78   Ht 5\' 10"  (1.778 m)   Wt 262 lb 3.2 oz (118.9 kg)   BMI 37.62 kg/m   Wt Readings from Last 3 Encounters:  05/13/16 262 lb 3.2 oz (118.9 kg)  01/12/16 245 lb (111.1 kg)  10/27/15 255 lb (115.7 kg)   General:  Alert, oriented, no distress.  Skin: normal turgor, no rashes HEENT: Normocephalic, atraumatic. Pupils round and reactive; sclera anicteric; extraocular muscles intact; Fundi ** Nose without nasal septal hypertrophy; his nose is crooked.  There is less opening in his left nares compared to his right Mouth/Parynx benign; Mallinpatti scale 3 Neck: No JVD, no carotid bruits Lungs: clear to ausculatation and percussion; no wheezing or rales  Chest wall: No tenderness to palpation Heart: RRR, s1 s2 normal; faint 1/6 systolic murmur.  No S3 gallop. Abdomen: soft, nontender; no hepatosplenomehaly, BS+; abdominal aorta nontender and not dilated by palpation. Back: No CVA tenderness Pulses 2+ Extremities: no clubbing cyanosis or edema, Homan's sign negative  Neurologic: grossly nonfocal; cranial nerves intact. Psychological: Normal affect and mood.  ECG (independently read by me): Not done in the office today, but his ECG from 08/26/2015 was reviewed which showed atrial fibrillation at 93 bpm.  LABS:  BMP Latest Ref Rng & Units 10/28/2015 10/16/2015  Glucose 65 - 99 mg/dL 157(H) 98  BUN 6 - 20 mg/dL 15 17  Creatinine 0.61 - 1.24 mg/dL 1.01 0.78  Sodium 135 - 145 mmol/L 137 141  Potassium 3.5 - 5.1 mmol/L 3.9 4.0  Chloride 101 - 111 mmol/L 106 109  CO2 22 - 32 mmol/L 22 24  Calcium 8.9 - 10.3 mg/dL 8.2(L) 9.3     No flowsheet data found.   CBC Latest Ref Rng & Units 10/29/2015 10/28/2015 10/16/2015  WBC 4.0 - 10.5 K/uL 12.3(H) 9.8 5.5  Hemoglobin 13.0 - 17.0 g/dL 11.8(L) 12.8(L) 15.2  Hematocrit 39.0 -  52.0 % 35.5(L) 39.6 45.0  Platelets 150 - 400 K/uL 145(L) 150 165     Lipid Panel  No results found for: CHOL, TRIG, HDL, CHOLHDL, VLDL, LDLCALC, LDLDIRECT   RADIOLOGY: No results found.    ASSESSMENT AND PLAN: Glen Bolton is a 75 year old Caucasian male who has a history of hypertension and permanent atrial fibrillation.  He was found to have moderate obstructive sleep apnea on his sleep study.  I reviewed his study in detail with him.  At that time, he had reduced sleep efficiency, and had abnormal sleep architecture with absence of slow-wave sleep and prolonged latency to REM sleep.  He had significant oxygen desaturation to a nadir of 79% and there was evidence for loud snoring.  He has felt improved since initiating CPAP therapy.  I reviewed normal sleep architecture, and adverse consequences of sleep apnea if left untreated.  I reviewed his download.  He is meeting Medicare compliance standards.  However, I have recommended increase sleep duration since he is only sleeping 5 hours and 26 minutes on average with his CPAP mask.  Adjustments were made today in the office to his AirSense 10 CPAP machine.  Wechanged his heated tubing and temperature control to auto rather than being on manual.  I also suggested that he can take over-the-counter Zyrtec or Claritin most avoid the deep preparations which may help some of his nasal congestion.  I also suggested that he utilize nasal saline prior to initiating CPAP therapy at night and use this as well in the morning.   Time spent: 25 minutes   Troy Sine, MD, [facility]  05/15/2016 1:25 PM

## 2016-05-19 ENCOUNTER — Telehealth: Payer: Self-pay | Admitting: *Deleted

## 2016-05-19 NOTE — Telephone Encounter (Signed)
[facility] is requesting a face to face visit note regarding the pts CPAP to get it started. Clinical support note from dr Claiborne Billings dated 05-13-16 faxed to the number provided.

## 2016-05-31 DIAGNOSIS — Z23 Encounter for immunization: Secondary | ICD-10-CM | POA: Diagnosis not present

## 2016-05-31 IMAGING — CR DG CHEST 2V
2 series · 2 of 2 positions shown · non-contrast
Comparison: None.

CLINICAL DATA: Preoperative evaluation for upcoming hip replacement

EXAM:
CHEST  2 VIEW

[w chest pa]
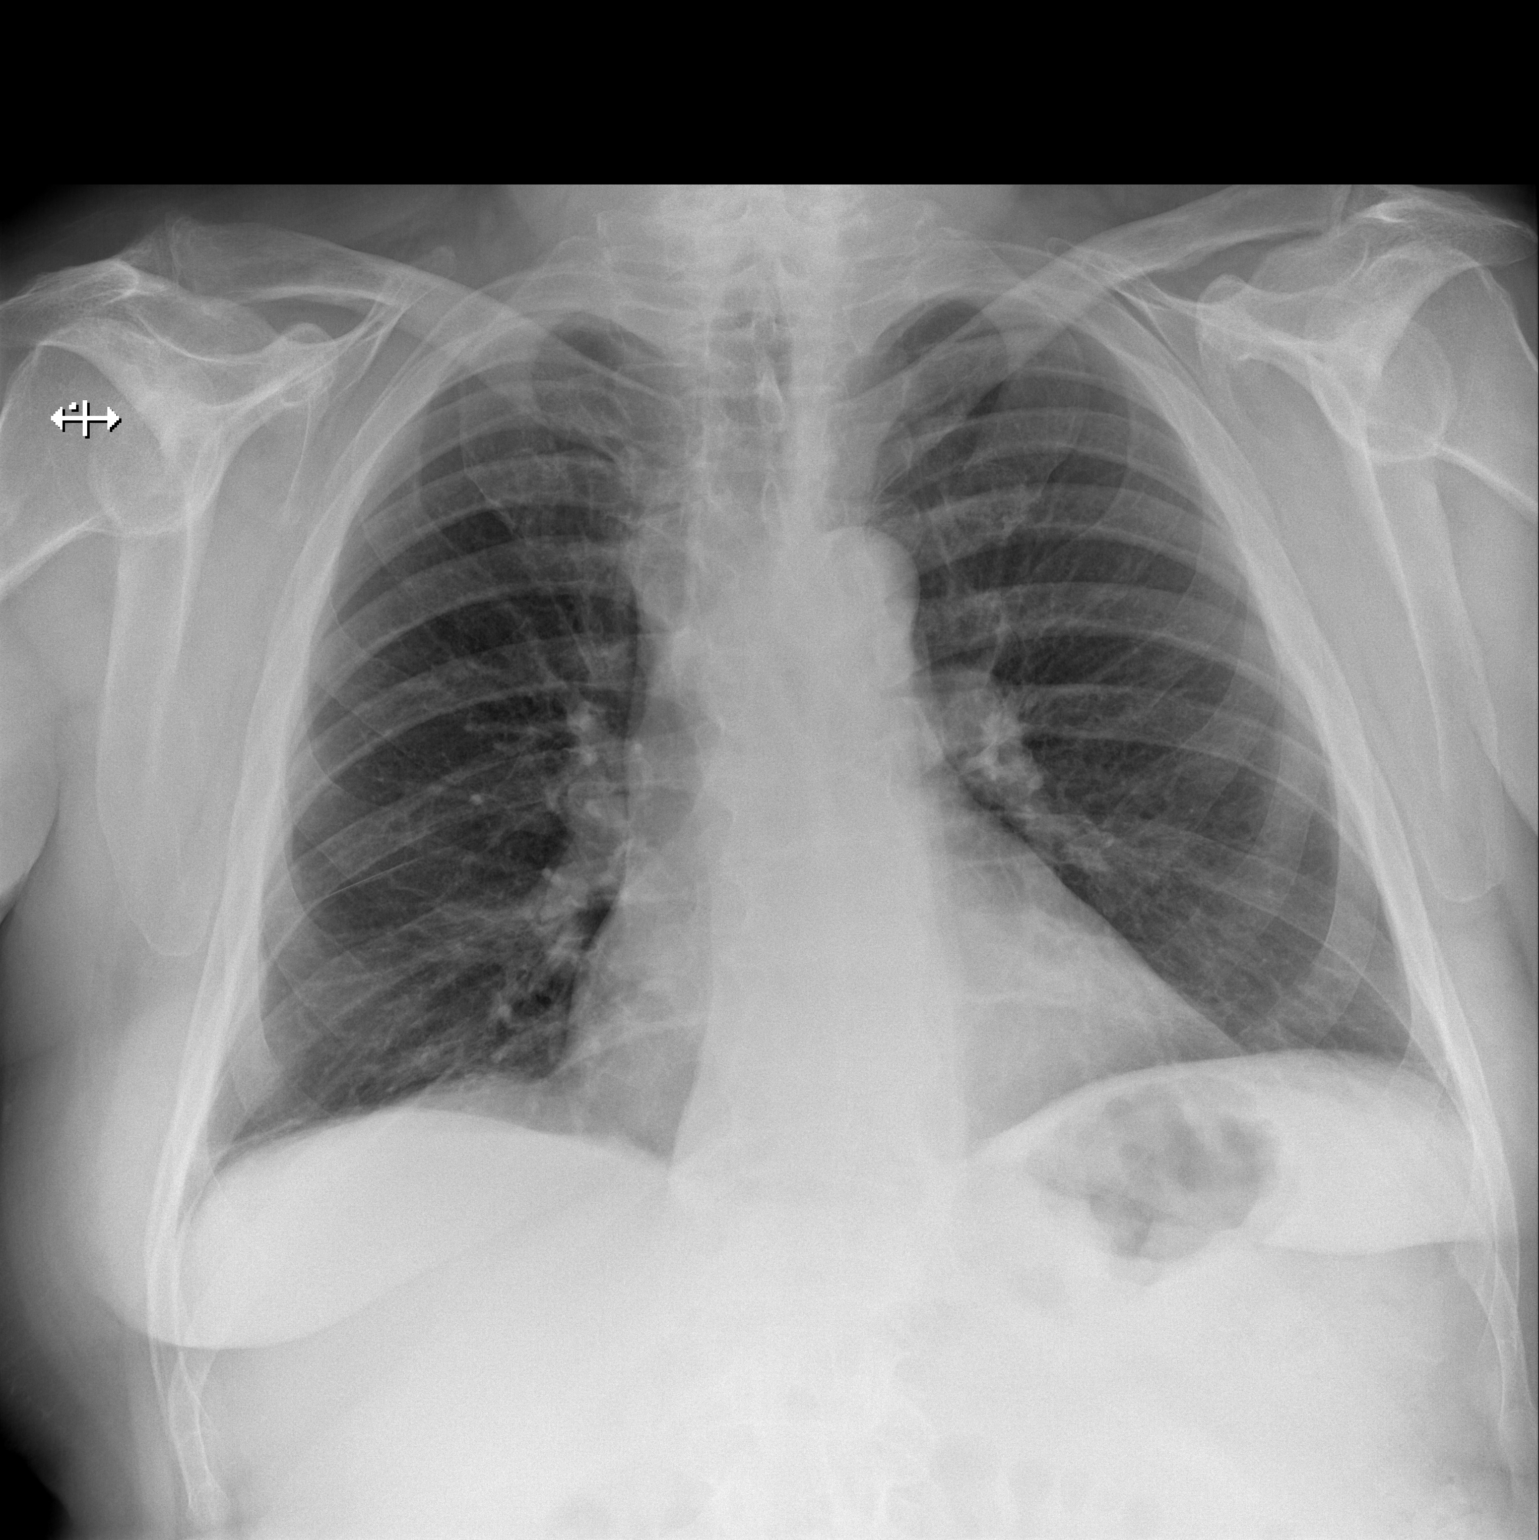

[w chest lat]
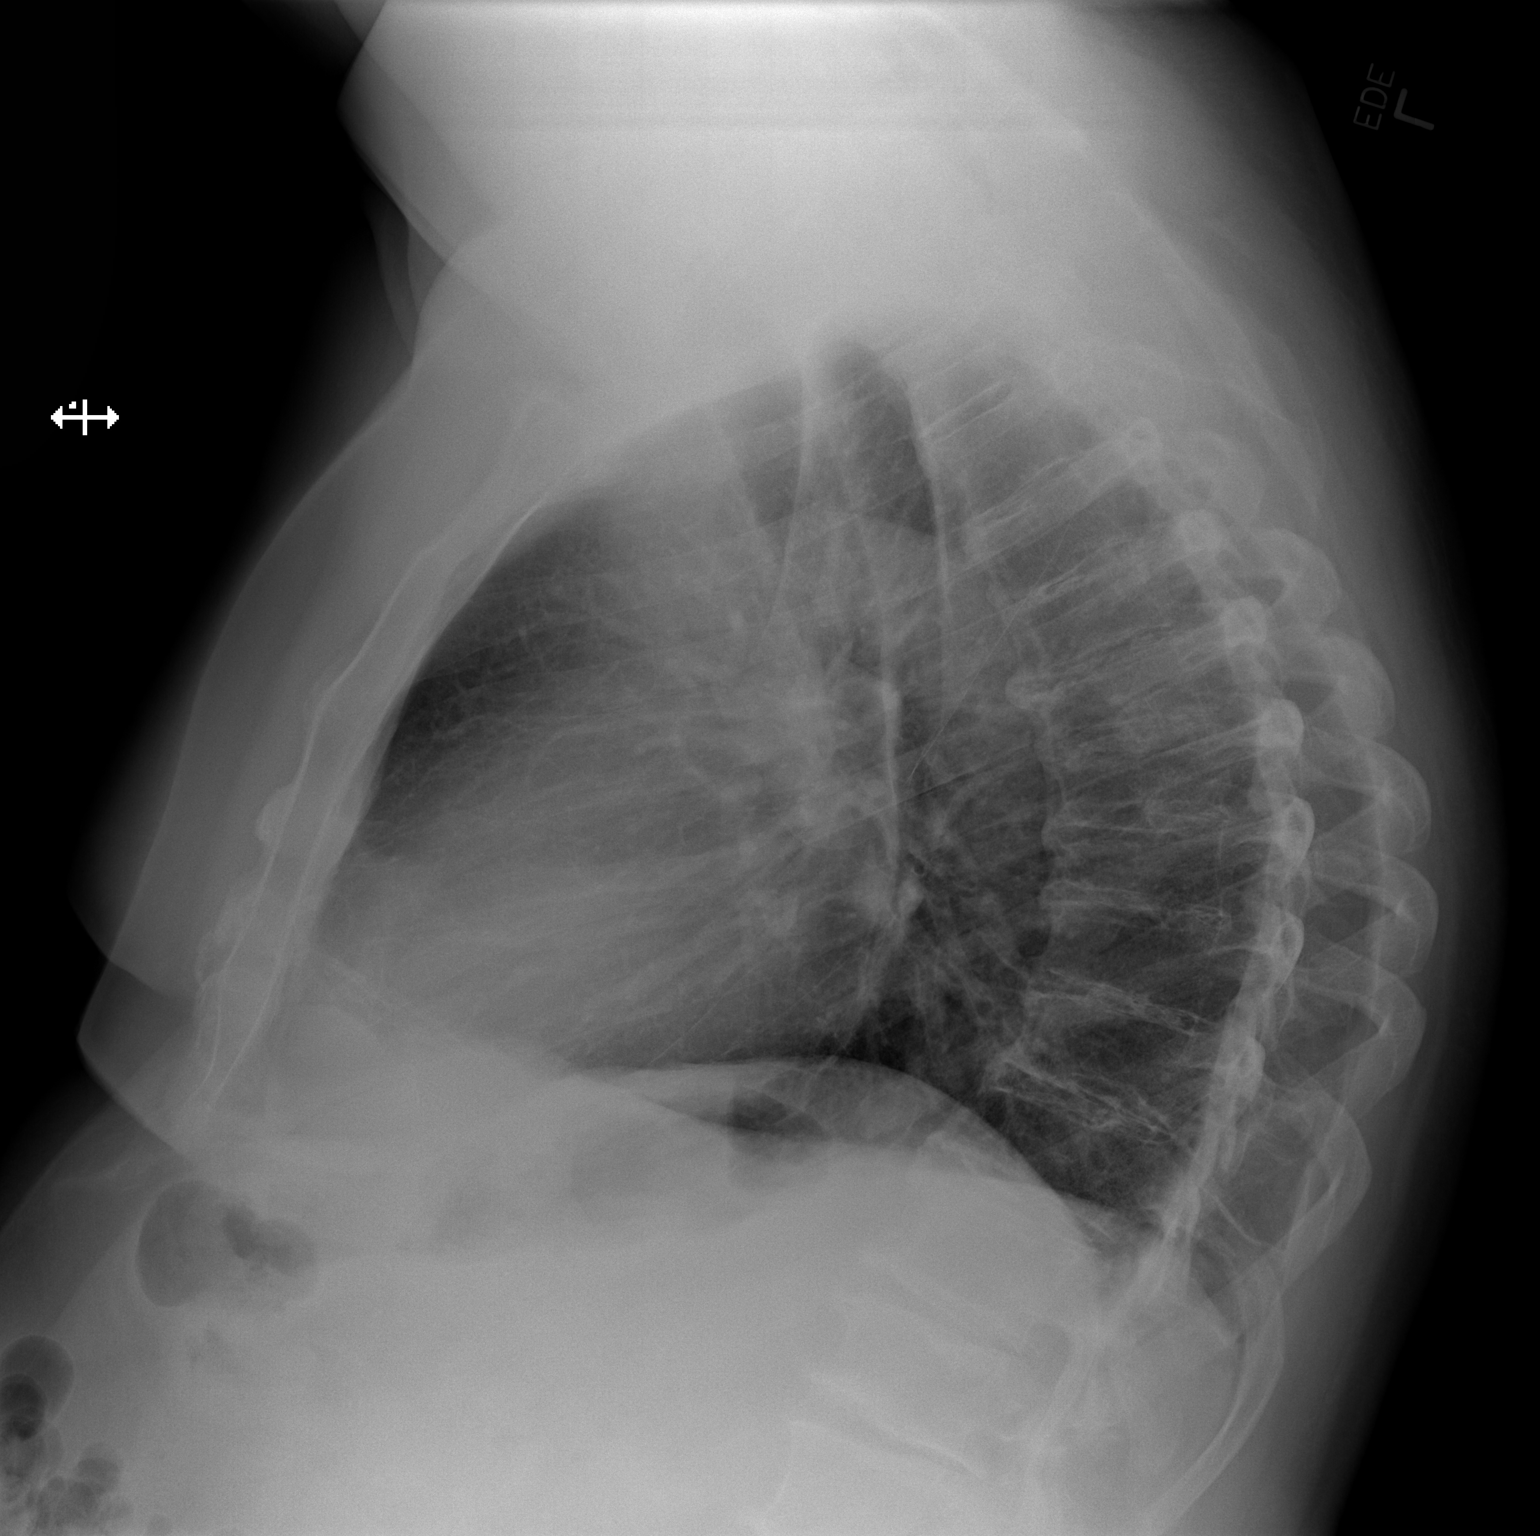

[2 of 2 positions shown; findings below may reference images not displayed]

FINDINGS: Cardiac shadow is within normal limits. The lungs are clear
bilaterally. No acute infiltrate or sizable effusion is noted. Mild
degenerative changes of the thoracic spine are seen.
IMPRESSION: No acute abnormality noted.

## 2016-06-18 ENCOUNTER — Ambulatory Visit (INDEPENDENT_AMBULATORY_CARE_PROVIDER_SITE_OTHER): Payer: Medicare Other | Admitting: Podiatry

## 2016-06-18 ENCOUNTER — Encounter: Payer: Self-pay | Admitting: Podiatry

## 2016-06-18 DIAGNOSIS — M722 Plantar fascial fibromatosis: Secondary | ICD-10-CM

## 2016-06-18 DIAGNOSIS — R52 Pain, unspecified: Secondary | ICD-10-CM | POA: Diagnosis not present

## 2016-06-18 MED ORDER — MELOXICAM 15 MG PO TABS: 15.0000 mg | ORAL_TABLET | Freq: Every day | ORAL | 0 refills | Status: DC

## 2016-06-18 NOTE — Progress Notes (Signed)
Subjective:     Patient ID: Glen Bolton, male   DOB: 02-09-1941, 75 y.o.   MRN: [id number]  HPIhis patient returns to the office Today stating that he has developed pain in both heels with his left heel more painful. He has pain upon rising in the morning and standing from a sitting position. He says he had a previous problem when he lived in [place] and received an injection. He says that the pain is only about 3 out of 10 at this visit. He has provided no self treatment nor sought any professional help. No history of trauma. He presents the office today for an evaluation and treatment of this condition   Review of Systems     Objective:   Physical Exam GENERAL APPEARANCE: Alert, conversant. Appropriately groomed. No acute distress.  VASCULAR: Pedal pulses palpable at  [facility] and PT bilateral.  Capillary refill time is immediate to all digits,  Normal temperature gradient.  Digital hair growth is present bilateral  NEUROLOGIC: sensation is normal to 5.07 monofilament at 5/5 sites bilateral.  Light touch is intact bilateral, Muscle strength normal.  MUSCULOSKELETAL: acceptable muscle strength, tone and stability bilateral.  Intrinsic muscluature intact bilateral.  Rectus appearance of foot and digits noted bilateral. Palpable pain noted at the insertion of the plantar fascia of the left foot and along the course of the plantar fascia left foot   DERMATOLOGIC: skin color, texture, and turgor are within normal limits.  No preulcerative lesions or ulcers  are seen, no interdigital maceration noted.  No open lesions present.  Digital nails are asymptomatic. No drainage noted.      Assessment:     Plantar fascitis left foot.    Plan:     ROV  Discussed the condition with this patient and since the pain was not severe. We decided to recommend power step insoles for him to wear in his shoes. He was also prescribed Mobic 15 mg he is to return the office in 2 weeks for further evaluation and  treatment   Gardiner Barefoot DPM   Gardiner Barefoot DPM

## 2016-06-23 DIAGNOSIS — M170 Bilateral primary osteoarthritis of knee: Secondary | ICD-10-CM | POA: Diagnosis not present

## 2016-06-23 DIAGNOSIS — M79672 Pain in left foot: Secondary | ICD-10-CM | POA: Diagnosis not present

## 2016-06-23 DIAGNOSIS — M25551 Pain in right hip: Secondary | ICD-10-CM | POA: Diagnosis not present

## 2016-06-28 ENCOUNTER — Ambulatory Visit: Payer: Medicare Other | Admitting: Cardiovascular Disease

## 2016-06-30 DIAGNOSIS — M7062 Trochanteric bursitis, left hip: Secondary | ICD-10-CM | POA: Diagnosis not present

## 2016-06-30 DIAGNOSIS — M25552 Pain in left hip: Secondary | ICD-10-CM | POA: Diagnosis not present

## 2016-07-01 ENCOUNTER — Ambulatory Visit: Payer: Medicare Other | Admitting: Podiatry

## 2016-07-01 DIAGNOSIS — M25552 Pain in left hip: Secondary | ICD-10-CM | POA: Diagnosis not present

## 2016-07-01 DIAGNOSIS — M7062 Trochanteric bursitis, left hip: Secondary | ICD-10-CM | POA: Diagnosis not present

## 2016-07-06 DIAGNOSIS — M25552 Pain in left hip: Secondary | ICD-10-CM | POA: Diagnosis not present

## 2016-07-06 DIAGNOSIS — M7062 Trochanteric bursitis, left hip: Secondary | ICD-10-CM | POA: Diagnosis not present

## 2016-07-13 DIAGNOSIS — M7062 Trochanteric bursitis, left hip: Secondary | ICD-10-CM | POA: Diagnosis not present

## 2016-07-13 DIAGNOSIS — M25552 Pain in left hip: Secondary | ICD-10-CM | POA: Diagnosis not present

## 2016-07-19 DIAGNOSIS — M7062 Trochanteric bursitis, left hip: Secondary | ICD-10-CM | POA: Diagnosis not present

## 2016-07-19 DIAGNOSIS — M25552 Pain in left hip: Secondary | ICD-10-CM | POA: Diagnosis not present

## 2016-07-21 DIAGNOSIS — M25552 Pain in left hip: Secondary | ICD-10-CM | POA: Diagnosis not present

## 2016-07-22 DIAGNOSIS — H903 Sensorineural hearing loss, bilateral: Secondary | ICD-10-CM | POA: Diagnosis not present

## 2016-07-22 DIAGNOSIS — Z822 Family history of deafness and hearing loss: Secondary | ICD-10-CM | POA: Diagnosis not present

## 2016-07-31 ENCOUNTER — Other Ambulatory Visit: Payer: Self-pay | Admitting: Cardiology

## 2016-08-02 NOTE — Telephone Encounter (Signed)
Rx request sent to pharmacy.  

## 2016-08-31 DIAGNOSIS — Z79899 Other long term (current) drug therapy: Secondary | ICD-10-CM | POA: Diagnosis not present

## 2016-08-31 DIAGNOSIS — I4891 Unspecified atrial fibrillation: Secondary | ICD-10-CM | POA: Diagnosis not present

## 2016-08-31 DIAGNOSIS — I10 Essential (primary) hypertension: Secondary | ICD-10-CM | POA: Diagnosis not present

## 2016-08-31 DIAGNOSIS — R7301 Impaired fasting glucose: Secondary | ICD-10-CM | POA: Diagnosis not present

## 2016-09-07 ENCOUNTER — Other Ambulatory Visit: Payer: Self-pay | Admitting: Cardiology

## 2016-09-07 MED ORDER — RIVAROXABAN 20 MG PO TABS: 20.0000 mg | ORAL_TABLET | Freq: Every day | ORAL | 0 refills | Status: DC

## 2016-09-07 NOTE — Telephone Encounter (Signed)
Glen Bolton is calling because he needs a refill of Xarelto. He is prescribed for a 30 day supply and his Pharmacy is [facility] on [address] [phone].

## 2016-09-07 NOTE — Telephone Encounter (Signed)
Rx(s) sent to pharmacy electronically.  

## 2016-09-15 DIAGNOSIS — M25552 Pain in left hip: Secondary | ICD-10-CM | POA: Diagnosis not present

## 2016-09-18 ENCOUNTER — Other Ambulatory Visit: Payer: Self-pay | Admitting: Cardiology

## 2016-10-05 ENCOUNTER — Other Ambulatory Visit: Payer: Self-pay | Admitting: *Deleted

## 2016-10-05 MED ORDER — RIVAROXABAN 20 MG PO TABS: 20.0000 mg | ORAL_TABLET | Freq: Every day | ORAL | 0 refills | Status: DC

## 2016-10-05 NOTE — Telephone Encounter (Signed)
rivaroxaban (XARELTO) 20 MG TABS tablet  Medication  Date: 09/07/2016 Department: [facility] Ordering/Authorizing: Minus Breeding, MD  Order Providers   Prescribing Provider Encounter Provider  Minus Breeding, MD Minus Breeding, MD  Medication Detail    Disp Refills Start End   rivaroxaban (XARELTO) 20 MG TABS tablet 30 tablet 0 09/07/2016    Sig - Route: Take 1 tablet (20 mg total) by mouth daily with supper. <PLEASE MAKE APPOINTMENT FOR REFILLS> - Oral   E-Prescribing Status: Receipt confirmed by pharmacy (09/07/2016 1:46 PM EST)   Pharmacy   [facility], [place]

## 2016-10-06 DIAGNOSIS — H43812 Vitreous degeneration, left eye: Secondary | ICD-10-CM | POA: Diagnosis not present

## 2016-10-06 DIAGNOSIS — H401133 Primary open-angle glaucoma, bilateral, severe stage: Secondary | ICD-10-CM | POA: Diagnosis not present

## 2016-10-13 DIAGNOSIS — L308 Other specified dermatitis: Secondary | ICD-10-CM | POA: Diagnosis not present

## 2016-10-21 ENCOUNTER — Other Ambulatory Visit (HOSPITAL_COMMUNITY): Payer: Self-pay | Admitting: Orthopedic Surgery

## 2016-10-21 DIAGNOSIS — M25552 Pain in left hip: Secondary | ICD-10-CM

## 2016-10-26 ENCOUNTER — Telehealth: Payer: Self-pay | Admitting: Cardiology

## 2016-10-26 MED ORDER — RIVAROXABAN 20 MG PO TABS: 20.0000 mg | ORAL_TABLET | Freq: Every day | ORAL | 0 refills | Status: DC

## 2016-10-26 NOTE — Telephone Encounter (Signed)
New Message   *STAT* If patient is at the pharmacy, call can be transferred to refill team.   1. Which medications need to be refilled? (please list name of each medication and dose if known) rivaroxaban (Xarelto) 20 mg tabs tablet once daily  2. Which pharmacy/location (including street and city if local pharmacy) is medication to be sent to? [facility], [place], [place]  3. Do they need a 30 day or 90 day supply? 30 day supply

## 2016-10-27 DIAGNOSIS — M25552 Pain in left hip: Secondary | ICD-10-CM | POA: Diagnosis not present

## 2016-11-04 DIAGNOSIS — H401133 Primary open-angle glaucoma, bilateral, severe stage: Secondary | ICD-10-CM | POA: Diagnosis not present

## 2016-11-04 DIAGNOSIS — H43812 Vitreous degeneration, left eye: Secondary | ICD-10-CM | POA: Diagnosis not present

## 2016-11-05 ENCOUNTER — Encounter: Payer: Self-pay | Admitting: Cardiology

## 2016-11-05 ENCOUNTER — Encounter (HOSPITAL_COMMUNITY)
Admission: RE | Admit: 2016-11-05 | Discharge: 2016-11-05 | Disposition: A | Discharge status: Home / Self Care | Payer: Medicare Other | Source: Ambulatory Visit | Attending: Orthopedic Surgery | Admitting: Orthopedic Surgery

## 2016-11-05 ENCOUNTER — Ambulatory Visit (HOSPITAL_COMMUNITY)
Admission: RE | Admit: 2016-11-05 | Discharge: 2016-11-05 | Disposition: A | Discharge status: Home / Self Care | Payer: Medicare Other | Source: Ambulatory Visit | Attending: Orthopedic Surgery | Admitting: Orthopedic Surgery

## 2016-11-05 DIAGNOSIS — R948 Abnormal results of function studies of other organs and systems: Secondary | ICD-10-CM | POA: Diagnosis not present

## 2016-11-05 DIAGNOSIS — Z96642 Presence of left artificial hip joint: Secondary | ICD-10-CM | POA: Diagnosis not present

## 2016-11-05 DIAGNOSIS — M25552 Pain in left hip: Secondary | ICD-10-CM | POA: Diagnosis not present

## 2016-11-05 MED ORDER — TECHNETIUM TC 99M MEDRONATE IV KIT: 25.0000 | PACK | Freq: Once | INTRAVENOUS | Status: DC | PRN

## 2016-11-11 NOTE — Progress Notes (Signed)
Cardiology Office Note   Date:  11/13/2016   ID:  Glen Bolton, DOB [birth year], MRN [id number]  PCP:  Lujean Amel, MD  Cardiologist:   Minus Breeding, MD   Chief Complaint  Patient presents with  . Atrial Fibrillation     History of Present Illness: Glen Bolton is a 76 y.o. male who presents for evaluation of atrial fibrillation.  Since I last saw him he has been treated for sleep apnea.  He has had rapid rates and med adjustments with increased beta blocker.  He is on anticoagulation.  He was cleared for hip surgery when I last saw him.   He is having some hip pain and recently had a bone scan. His thought maybe have a stress fracture. He's not had any cardiac complaints. The patient denies any new symptoms such as chest discomfort, neck or arm discomfort. There has been no new shortness of breath, PND or orthopnea. There have been no reported palpitations, presyncope or syncope.  He is being treated now for sleep apnea with a CPAP always had upper respiratory symptoms and hasn't been as successful with this as a will be will be in the future.   Past Medical History:  Diagnosis Date  . Arthritis   . Atrial fibrillation ([place])   . Dysrhythmia   . GERD (gastroesophageal reflux disease)   . Glaucoma   . Hard of hearing   . History of gastritis   . History of kidney stones   . History of stomach ulcers   . Hx of migraines    no migraines since he was in his 7's  . Hypertension   . Sleep apnea    CPAP    Past Surgical History:  Procedure Laterality Date  . CATARACT EXTRACTION Bilateral   . COLONOSCOPY    . ESOPHAGOGASTRODUODENOSCOPY    . INGUINAL HERNIA REPAIR Left    x 2  . Rotator cuff surgery Right 2013  . TOTAL HIP ARTHROPLASTY Left 10/27/2015   Procedure: TOTAL HIP ARTHROPLASTY ANTERIOR APPROACH;  Surgeon: Frederik Pear, MD;  Location: [place];  Service: Orthopedics;  Laterality: Left;     Current Outpatient Prescriptions  Medication Sig Dispense Refill  .  amLODipine (NORVASC) 10 MG tablet Take 5 mg by mouth daily.  0  . amLODipine-benazepril (LOTREL) 10-20 MG per capsule Take 1 capsule by mouth daily.    . bimatoprost (LUMIGAN) 0.01 % SOLN Place 1 drop into both eyes at bedtime.     . dorzolamide (TRUSOPT) 2 % ophthalmic solution Place 1 drop into both eyes 2 (two) times daily.     . hydrochlorothiazide (HYDRODIURIL) 25 MG tablet Take 1 tablet by mouth daily.    . meloxicam (MOBIC) 15 MG tablet Take 1 tablet (15 mg total) by mouth daily. 30 tablet 0  . methocarbamol (ROBAXIN) 500 MG tablet TAKE 1 TABLET BY MOUTH EVERY 6 HOURS AS NEEDED FOR SPASMS  0  . metoprolol succinate (TOPROL-XL) 50 MG 24 hr tablet take 1 tablet by mouth once daily TAKE WITH OR IMMEDIATELY FOLLOWING A MEAL 90 tablet 3  . Multiple Vitamins-Minerals (MULTIVITAMIN & MINERAL PO) Take 1 tablet by mouth daily.    . Probiotic Product (PROBIOTIC PO) Take 1 tablet by mouth daily.     . rivaroxaban (XARELTO) 20 MG TABS tablet Take 1 tablet (20 mg total) by mouth daily with supper. 30 tablet 11  . valsartan (DIOVAN) 80 MG tablet      No current facility-administered medications for this  visit.     Allergies:   Patient has no known allergies.     ROS:  Please see the history of present illness.   Otherwise, review of systems are positive for snoring and decreased hearing.   All other systems are reviewed and negative.    PHYSICAL EXAM: VS:  BP 130/82   Pulse 82   Ht 5\' 10"  (1.778 m)   Wt 264 lb (119.7 kg)   BMI 37.88 kg/m  , BMI Body mass index is 37.88 kg/m. GENERAL:  Well appearing NECK:  No jugular venous distention, waveform within normal limits, carotid upstroke brisk and symmetric, no bruits, no thyromegaly LYMPHATICS:  No cervical, inguinal adenopathy LUNGS:  Clear to auscultation bilaterally BACK:  No CVA tenderness CHEST:  Unremarkable HEART:  PMI not displaced or sustained,S1 and S2 within normal limits, no S3, no S4, no clicks, no rubs, no murmurs,  irregular ABD:  Flat, positive bowel sounds normal in frequency in pitch, no bruits, no rebound, no guarding, no midline pulsatile mass, no hepatomegaly, no splenomegaly EXT:  2 plus pulses throughout, mild edema, no cyanosis no clubbing SKIN:  No rashes no nodules    EKG:  EKG is ordered today. Atrial fibrillation, rate 96, axis within normal limits, intervals within normal limits, no acute ST-T wave changes.  Recent Labs: No results found for requested labs within last 8760 hours.    Lipid Panel No results found for: CHOL, TRIG, HDL, CHOLHDL, VLDL, LDLCALC, LDLDIRECT    Wt Readings from Last 3 Encounters:  11/12/16 264 lb (119.7 kg)  05/13/16 262 lb 3.2 oz (118.9 kg)  01/12/16 245 lb (111.1 kg)      Other studies Reviewed: Additional studies/ records that were reviewed today include:  None Review of the above records demonstrates:     ASSESSMENT AND PLAN:  ATRIAL FIB:    Glen Bolton has a CHA2DS2 - VASc score of 2 with a risk of stroke of 2.2%.    He tolerates rate control and anticoagulation.  SLEEP APNEA:  He has been diagnosed with sleep apnea and has seen Dr. Claiborne Billings and is being treated with CPAP.    OBESITY:  The patient understands the need to lose weight with diet and exercise. We have discussed specific strategies for this.   Current medicines are reviewed at length with the patient today.  The patient does not have concerns regarding medicines.  The following changes have been made:  None  Labs/ tests ordered today include:   None  Orders Placed This Encounter  Procedures  . EKG 12-Lead     Disposition:   FU with me in 12 months.     Signed, Minus Breeding, MD  11/13/2016 1:44 PM    [facility]

## 2016-11-12 ENCOUNTER — Ambulatory Visit (INDEPENDENT_AMBULATORY_CARE_PROVIDER_SITE_OTHER): Payer: Medicare Other | Admitting: Cardiology

## 2016-11-12 ENCOUNTER — Encounter: Payer: Self-pay | Admitting: Cardiology

## 2016-11-12 VITALS — BP 130/82 | HR 82 | Ht 70.0 in | Wt 264.0 lb

## 2016-11-12 DIAGNOSIS — I4819 Other persistent atrial fibrillation: Secondary | ICD-10-CM

## 2016-11-12 DIAGNOSIS — I481 Persistent atrial fibrillation: Secondary | ICD-10-CM | POA: Diagnosis not present

## 2016-11-12 MED ORDER — RIVAROXABAN 20 MG PO TABS: 20.0000 mg | ORAL_TABLET | Freq: Every day | ORAL | 11 refills | Status: DC

## 2016-11-12 NOTE — Patient Instructions (Signed)

## 2016-11-13 ENCOUNTER — Encounter: Payer: Self-pay | Admitting: Cardiology

## 2016-11-23 DIAGNOSIS — M25552 Pain in left hip: Secondary | ICD-10-CM | POA: Diagnosis not present

## 2016-11-23 DIAGNOSIS — M1711 Unilateral primary osteoarthritis, right knee: Secondary | ICD-10-CM | POA: Diagnosis not present

## 2016-11-23 DIAGNOSIS — M1712 Unilateral primary osteoarthritis, left knee: Secondary | ICD-10-CM | POA: Diagnosis not present

## 2017-02-01 DIAGNOSIS — H43812 Vitreous degeneration, left eye: Secondary | ICD-10-CM | POA: Diagnosis not present

## 2017-02-01 DIAGNOSIS — H401133 Primary open-angle glaucoma, bilateral, severe stage: Secondary | ICD-10-CM | POA: Diagnosis not present

## 2017-03-03 DIAGNOSIS — Z79899 Other long term (current) drug therapy: Secondary | ICD-10-CM | POA: Diagnosis not present

## 2017-03-03 DIAGNOSIS — I4891 Unspecified atrial fibrillation: Secondary | ICD-10-CM | POA: Diagnosis not present

## 2017-03-03 DIAGNOSIS — E7800 Pure hypercholesterolemia, unspecified: Secondary | ICD-10-CM | POA: Diagnosis not present

## 2017-03-03 DIAGNOSIS — Z0001 Encounter for general adult medical examination with abnormal findings: Secondary | ICD-10-CM | POA: Diagnosis not present

## 2017-03-03 DIAGNOSIS — L918 Other hypertrophic disorders of the skin: Secondary | ICD-10-CM | POA: Diagnosis not present

## 2017-03-03 DIAGNOSIS — I10 Essential (primary) hypertension: Secondary | ICD-10-CM | POA: Diagnosis not present

## 2017-03-31 DIAGNOSIS — L918 Other hypertrophic disorders of the skin: Secondary | ICD-10-CM | POA: Diagnosis not present

## 2017-03-31 DIAGNOSIS — B078 Other viral warts: Secondary | ICD-10-CM | POA: Diagnosis not present

## 2017-03-31 DIAGNOSIS — L538 Other specified erythematous conditions: Secondary | ICD-10-CM | POA: Diagnosis not present

## 2017-05-02 DIAGNOSIS — H903 Sensorineural hearing loss, bilateral: Secondary | ICD-10-CM | POA: Diagnosis not present

## 2017-05-25 DIAGNOSIS — M1712 Unilateral primary osteoarthritis, left knee: Secondary | ICD-10-CM | POA: Diagnosis not present

## 2017-05-25 DIAGNOSIS — M1711 Unilateral primary osteoarthritis, right knee: Secondary | ICD-10-CM | POA: Diagnosis not present

## 2017-05-30 DIAGNOSIS — R7303 Prediabetes: Secondary | ICD-10-CM | POA: Diagnosis not present

## 2017-06-02 DIAGNOSIS — Z23 Encounter for immunization: Secondary | ICD-10-CM | POA: Diagnosis not present

## 2017-06-03 ENCOUNTER — Ambulatory Visit (INDEPENDENT_AMBULATORY_CARE_PROVIDER_SITE_OTHER): Payer: Medicare Other | Admitting: Cardiovascular Disease

## 2017-06-03 ENCOUNTER — Encounter: Payer: Self-pay | Admitting: Cardiovascular Disease

## 2017-06-03 VITALS — BP 133/81 | HR 71 | Ht 70.0 in | Wt 254.0 lb

## 2017-06-03 DIAGNOSIS — I10 Essential (primary) hypertension: Secondary | ICD-10-CM | POA: Diagnosis not present

## 2017-06-03 DIAGNOSIS — I4821 Permanent atrial fibrillation: Secondary | ICD-10-CM

## 2017-06-03 DIAGNOSIS — G4733 Obstructive sleep apnea (adult) (pediatric): Secondary | ICD-10-CM | POA: Diagnosis not present

## 2017-06-03 DIAGNOSIS — R0982 Postnasal drip: Secondary | ICD-10-CM | POA: Diagnosis not present

## 2017-06-03 DIAGNOSIS — I482 Chronic atrial fibrillation: Secondary | ICD-10-CM

## 2017-06-03 DIAGNOSIS — E668 Other obesity: Secondary | ICD-10-CM

## 2017-06-03 NOTE — Progress Notes (Addendum)
ID:  Glen Bolton, DOB 09-29-1940, MRN [id number]  PCP:  Lujean Amel, MD                    Cardiologist:   Minus Breeding, MD   HPI: Glen Bolton is a 76 y.o. male who presents for one-year follow-up sleep evaluation  Glen Bolton followed by Dr. Percival Spanish for cardiology care and has a history of atrial fibrillation, hypertension, and GERD.  Due to concerns for obstructive sleep apnea.  He was referred for diagnostic polysomnogram which was done on 11/20/2015.  This confirmed moderate obstructive sleep apnea with an overall AHI of 18.2 per hour.  Events were worse with supine sleep with an HI of 21.6 per hour.  He had significant oxygen desaturation to 79%.  There was reduced sleep efficiency of only 64.4%.  He had loud snoring.  He was in atrial fibrillation for the duration of the study and there were occasional PVCs.  He ultimately underwent a complete CPAP titration study on 01/12/2016 and CPAP was titrated up to 13 cm water pressure.  He has been on CPAP therapy since June.  His D[facility].  A download from June 29 through 04/02/2016 revealed 83% of days with usage and 70% of days with usage greater than 4 hours.  He was just meeting Medicare compliance standards.  His average usage per night was 5 hours and 26 minutes.  At this at pressure of 13 cm, AHI was excellent at 0.7 despite a significant mask leak at 45.1 L/m.  He has been using nasal pillows.  Typically goes to bed at 10 PM, but wakes up between 5 and 6 AM.  He has had difficulty with dry mouth as well as intermittent nasal congestion.  He feels that he has more energy since initiating CPAP therapy.  He is unaware of any breakthrough snoring.  An Epworth scale following CPAP initiation in 2017 and this endorsed at 4 and shown below arguing against residual daytime sleepiness.  Epworth Sleepiness Scale: Situation   Chance of Dozing/Sleeping (0 = never , 1 = slight chance , 2 = moderate chance , 3 = high chance )    sitting and reading 1   watching TV 1   sitting inactive in a public place 0   being a passenger in a motor vehicle for an hour or more 0   lying down in the afternoon 1   sitting and talking to someone 0   sitting quietly after lunch (no alcohol) 1   while stopped for a few minutes in traffic as the driver 0   Total Score  4    Over the past year, Glen Bolton admits to continued CPAP use.  However, for the last several months he has had problems with nasal congestion as well as postnasal drip.  When his nose gets congested.  He has not been using CPAP.  States he's been averaging use a proximally 4 days per week.  He feels improved since he has been using CPAP.  His prior 3 times per night nocturia is now down to 0-1 time per night.  His sleep is restorative.  He is unaware of breakthrough snoring.  He denies residual daytime sleepiness. He typically goes to bed between 9 and 10 PM and wakes up at 6 AM.  An Epworth Sleepiness Scale score was recalculated in the office today and this endorsed at 8, again arguing against excessive daytime sleepiness.  Past Medical History:  Diagnosis Date  . Arthritis   . Atrial fibrillation ([place])   . Dysrhythmia   . GERD (gastroesophageal reflux disease)   . Glaucoma   . Hard of hearing   . History of gastritis   . History of kidney stones   . History of stomach ulcers   . Hx of migraines    no migraines since he was in his 60's  . Hypertension   . Sleep apnea    CPAP    Past Surgical History:  Procedure Laterality Date  . CATARACT EXTRACTION Bilateral   . COLONOSCOPY    . ESOPHAGOGASTRODUODENOSCOPY    . INGUINAL HERNIA REPAIR Left    x 2  . Rotator cuff surgery Right 2013  . TOTAL HIP ARTHROPLASTY Left 10/27/2015   Procedure: TOTAL HIP ARTHROPLASTY ANTERIOR APPROACH;  Surgeon: Frederik Pear, MD;  Location: [facility];  Service: Orthopedics;  Laterality: Left;    No Known Allergies  Current Outpatient Prescriptions  Medication Sig Dispense  Refill  . amLODipine (NORVASC) 10 MG tablet Take 5 mg by mouth daily.  0  . bimatoprost (LUMIGAN) 0.01 % SOLN Place 1 drop into both eyes at bedtime.     . dorzolamide (TRUSOPT) 2 % ophthalmic solution Place 1 drop into both eyes 2 (two) times daily.     . hydrochlorothiazide (HYDRODIURIL) 25 MG tablet Take 1 tablet by mouth daily.    . meloxicam (MOBIC) 15 MG tablet Take 1 tablet (15 mg total) by mouth daily. 30 tablet 0  . methocarbamol (ROBAXIN) 500 MG tablet TAKE 1 TABLET BY MOUTH EVERY 6 HOURS AS NEEDED FOR SPASMS  0  . metoprolol succinate (TOPROL-XL) 50 MG 24 hr tablet take 1 tablet by mouth once daily TAKE WITH OR IMMEDIATELY FOLLOWING A MEAL 90 tablet 3  . Multiple Vitamins-Minerals (MULTIVITAMIN & MINERAL PO) Take 1 tablet by mouth daily.    . Probiotic Product (PROBIOTIC PO) Take 1 tablet by mouth daily.     . rivaroxaban (XARELTO) 20 MG TABS tablet Take 1 tablet (20 mg total) by mouth daily with supper. 30 tablet 11  . olmesartan (BENICAR) 20 MG tablet TK 1 T PO D  4   No current facility-administered medications for this visit.     Social History   Social History  . Marital status: Married    Spouse name: N/A  . Number of children: 2  . Years of education: N/A   Occupational History  . Not on file.   Social History Main Topics  . Smoking status: Never Smoker  . Smokeless tobacco: Never Used  . Alcohol use 0.6 oz/week    1 Glasses of wine per week     Comment: daily  . Drug use: No  . Sexual activity: Not on file   Other Topics Concern  . 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  Family History  Problem Relation Age of Onset  . Hyperlipidemia Brother   . Stroke Sister 16     ROS General: Negative; No fevers, chills, or night sweats HEENT: Negative; No  changes in vision or hearing, sinus congestion, difficulty swallowing Pulmonary: Negative; No cough, wheezing, shortness of breath, hemoptysis Cardiovascular: Permanent atrial fibrillation GI: Negative; No nausea, vomiting, diarrhea, or  abdominal pain GU: Negative; No dysuria, hematuria, or difficulty voiding Musculoskeletal: Negative; no myalgias, joint pain, or weakness Hematologic: Negative; no easy bruising, bleeding Endocrine: Negative; no heat/cold intolerance Neuro: Negative; no changes in balance, headaches Skin: Negative; No rashes or skin lesions Psychiatric: Negative; No behavioral problems, depression Sleep: see above; since initiating CPAP therapy no daytime sleepiness, hypersomnolence, bruxism, restless legs, hypnogognic hallucinations, no cataplexy   Physical Exam Pulse 71   Ht 5\' 10"  (1.778 m)   Wt 254 lb (115.2 kg)   BMI 36.45 kg/m    Repeat blood pressure by me 128/80 using a large cuff  Wt Readings from Last 3 Encounters:  06/03/17 254 lb (115.2 kg)  11/12/16 264 lb (119.7 kg)  05/13/16 262 lb 3.2 oz (118.9 kg)   General: Alert, oriented, no distress.  Skin: normal turgor, no rashes, warm and dry HEENT: Normocephalic, atraumatic. Pupils equal round and reactive to light; sclera anicteric; extraocular muscles intact;  Nose without nasal septal hypertrophy Mouth/Parynx benign; Mallinpatti scale 3/4 Neck: Thick neck; No JVD, no carotid bruits; normal carotid upstroke Lungs: clear to ausculatation and percussion; no wheezing or rales Chest wall: without tenderness to palpitation Heart: PMI not displaced, RRR, s1 s2 normal, 1/6 systolic murmur, no diastolic murmur, no rubs, gallops, thrills, or heaves Abdomen: Moderate central adiposity;soft, nontender; no hepatosplenomehaly, BS+; abdominal aorta nontender and not dilated by palpation. Back: no CVA tenderness Pulses 2+ Musculoskeletal: full range of motion, normal strength, no joint deformities Extremities: no  clubbing cyanosis or edema, Homan's sign negative  Neurologic: grossly nonfocal; Cranial nerves grossly wnl Psychologic: Normal mood and affect   ECG (independently read by me): Atrial fibrillation at 71 bpm.  PVC.  ECG from 08/26/2015 was reviewed which showed atrial fibrillation at 93 bpm.  LABS:  BMP Latest Ref Rng & Units 10/28/2015 10/16/2015  Glucose 65 - 99 mg/dL 157(H) 98  BUN 6 - 20 mg/dL 15 17  Creatinine 0.61 - 1.24 mg/dL 1.01 0.78  Sodium 135 - 145 mmol/L 137 141  Potassium 3.5 - 5.1 mmol/L 3.9 4.0  Chloride 101 - 111 mmol/L 106 109  CO2 22 - 32 mmol/L 22 24  Calcium 8.9 - 10.3 mg/dL 8.2(L) 9.3     No flowsheet data found.   CBC Latest Ref Rng & Units 10/29/2015 10/28/2015 10/16/2015  WBC 4.0 - 10.5 K/uL 12.3(H) 9.8 5.5  Hemoglobin 13.0 - 17.0 g/dL 11.8(L) 12.8(L) 15.2  Hematocrit 39.0 - 52.0 % 35.5(L) 39.6 45.0  Platelets 150 - 400 K/uL 145(L) 150 165     Lipid Panel  No results found for: CHOL, TRIG, HDL, CHOLHDL, VLDL, LDLCALC, LDLDIRECT   RADIOLOGY: No results found.  IMPRESSION:  1. OSA (obstructive sleep apnea)   2. Permanent atrial fibrillation ([place])   3. Essential hypertension   4. Moderate obesity   5. Postnasal drip     ASSESSMENT AND PLAN: Glen Bolton is a 76 year old Caucasian male who has a history of hypertension and permanent atrial fibrillation.  He is on Xarelto for chronic anticoagulation.  He was found to have moderate obstructive sleep apnea on his sleep study from Spring 2017, which was more pronounced with supine posture.  I again reviewed his study in detail with him.  At that time, he had reduced sleep efficiency, and had abnormal sleep architecture with absence of slow-wave sleep and prolonged latency to REM sleep.  He had significant oxygen desaturation to a nadir of 79% and there was evidence for loud snoring.  He has  felt improved since initiating CPAP therapy. He has a ResMed AirSense 10 CPAP machine.  When I saw him last year  we change him to a heated tubing.  He uses CPAP most nights, but recently he has had issues with nasal congestion and a stuffy nose and has not been able to use therapy on those nights.  I have suggested that he use nasal saline prior to going to bed and also use this in the morning.  He also admits to significant postnasal drip.  I've suggested over-the-counter surtax, Claritin, or Allegra but he must avoid the deep preparation.  He continues to feel better and his sleep is now restorative.  His blood pressure today is controlled on amlodipine 5 mg, Toprol-XL 50 mg, HCTZ 25 mg, and is now on almost [place] 20 mg we discussed importance of weight loss with his BMI of 36.45.  His ECG shows atrial fibrillation which is permanent but rate is controlled.  He's not having bleeding on Xarelto.  He'll return to the cardiology care of Dr. Percival Spanish.  Per Medicare requirements  I will see him in one year for follow-up sleep assessment.  Time spent: 25 minutes   Troy Sine, MD, [facility]  06/03/2017 9:09 AM

## 2017-06-03 NOTE — Patient Instructions (Signed)
Medication Instructions:  Try OTC nasal saline and Zyrtec   Follow-Up: Your physician wants you to follow-up in: 1 YEAR with Dr. Claiborne Billings (sleep clinic) You will receive a reminder letter in the mail two months in advance. If you don't receive a letter, please call our office to schedule the follow-up appointment.   Any Other Special Instructions Will Be Listed Below (If Applicable).     If you need a refill on your cardiac medications before your next appointment, please call your pharmacy.

## 2017-06-21 ENCOUNTER — Ambulatory Visit
Admission: RE | Admit: 2017-06-21 | Discharge: 2017-06-21 | Disposition: A | Discharge status: Home / Self Care | Payer: Medicare Other | Source: Ambulatory Visit | Attending: Family Medicine | Admitting: Family Medicine

## 2017-06-21 ENCOUNTER — Other Ambulatory Visit: Payer: Self-pay | Admitting: Family Medicine

## 2017-06-21 DIAGNOSIS — R2689 Other abnormalities of gait and mobility: Secondary | ICD-10-CM

## 2017-06-21 DIAGNOSIS — M542 Cervicalgia: Secondary | ICD-10-CM

## 2017-06-21 DIAGNOSIS — H538 Other visual disturbances: Secondary | ICD-10-CM | POA: Diagnosis not present

## 2017-06-21 DIAGNOSIS — R51 Headache: Secondary | ICD-10-CM

## 2017-06-21 DIAGNOSIS — R519 Headache, unspecified: Secondary | ICD-10-CM

## 2017-06-21 DIAGNOSIS — H109 Unspecified conjunctivitis: Secondary | ICD-10-CM | POA: Diagnosis not present

## 2017-06-21 IMAGING — NM NM BONE 3 PHASE
2 series · 12 of 12 positions shown · non-contrast
Comparison: None.

CLINICAL DATA: Left hip pain for 1 year. History of a total left
hip arthroplasty 10/27/2015.

EXAM:
NUCLEAR MEDICINE 3-PHASE BONE SCAN
TECHNIQUE: Radionuclide angiographic images, immediate static blood pool
images, and 3-hour delayed static images were obtained of the
pelvis/hips after intravenous injection of radiopharmaceutical.
RADIOPHARMACEUTICALS:  21.0 mCi Dc-KKm MDP

[Series 1: flow · 4.14mm/px · 6 of 36 frames shown (1 of 2)]
[frame 4/36  full-range]
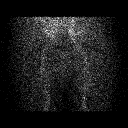
[frame 10/36  full-range]
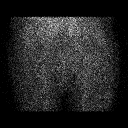
[frame 16/36  full-range]
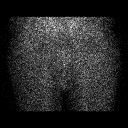
[frame 22/36  full-range]
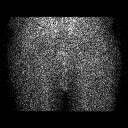
[frame 28/36  full-range]
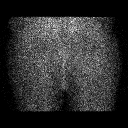
[frame 34/36  full-range]
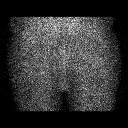

[Series 1: flow · 4.14mm/px · 6 of 40 frames shown (2 of 2)]
[frame 4/40  full-range]
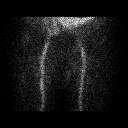
[frame 10/40  full-range]
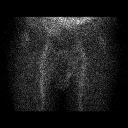
[frame 17/40  full-range]
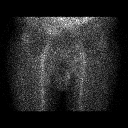
[frame 24/40  full-range]
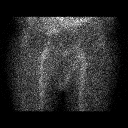
[frame 30/40  full-range]
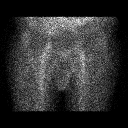
[frame 37/40  full-range]
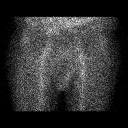

[12 of 12 positions shown; findings below may reference images not displayed]

FINDINGS: Vascular phase: Normal

Blood pool phase: Normal

Delayed phase: Abnormal asymmetric areas of increased uptake in the
left hip. Increased uptake in the region of the femoral calcar may
be reactive changes related to the prosthesis. A linear area of
uptake noted along the lateral distal aspect of the prosthesis which
could be a stress fracture.

Diffuse increased uptake in the sacrum bilaterally could be stress
related. Correlation with plain films is suggested.
IMPRESSION: Increased activity noted on delayed images in the region of the
femoral calcar, likely stress related or remodeling changes.

Linear area of increased uptake along the lateral aspect of the left
femoral shaft likely near the tip of the prosthesis. This could be
due to loosening or stress fracture. Correlation with plain films
suggested.

Diffuse increased uptake in the sacrum could be stress related.

## 2017-07-05 DIAGNOSIS — I10 Essential (primary) hypertension: Secondary | ICD-10-CM | POA: Diagnosis not present

## 2017-07-05 DIAGNOSIS — R2689 Other abnormalities of gait and mobility: Secondary | ICD-10-CM | POA: Diagnosis not present

## 2017-07-07 DIAGNOSIS — H01025 Squamous blepharitis left lower eyelid: Secondary | ICD-10-CM | POA: Diagnosis not present

## 2017-07-07 DIAGNOSIS — H401133 Primary open-angle glaucoma, bilateral, severe stage: Secondary | ICD-10-CM | POA: Diagnosis not present

## 2017-07-07 DIAGNOSIS — H01022 Squamous blepharitis right lower eyelid: Secondary | ICD-10-CM | POA: Diagnosis not present

## 2017-09-01 DIAGNOSIS — M519 Unspecified thoracic, thoracolumbar and lumbosacral intervertebral disc disorder: Secondary | ICD-10-CM | POA: Diagnosis not present

## 2017-09-01 DIAGNOSIS — R29898 Other symptoms and signs involving the musculoskeletal system: Secondary | ICD-10-CM | POA: Diagnosis not present

## 2017-09-01 DIAGNOSIS — R2689 Other abnormalities of gait and mobility: Secondary | ICD-10-CM | POA: Diagnosis not present

## 2017-09-07 ENCOUNTER — Ambulatory Visit: Payer: Medicare Other | Attending: Family Medicine

## 2017-09-07 DIAGNOSIS — M545 Low back pain: Secondary | ICD-10-CM | POA: Diagnosis not present

## 2017-09-07 DIAGNOSIS — G8929 Other chronic pain: Secondary | ICD-10-CM

## 2017-09-07 DIAGNOSIS — M6281 Muscle weakness (generalized): Secondary | ICD-10-CM | POA: Diagnosis not present

## 2017-09-07 DIAGNOSIS — R262 Difficulty in walking, not elsewhere classified: Secondary | ICD-10-CM | POA: Insufficient documentation

## 2017-09-07 NOTE — Patient Instructions (Addendum)
KNEE: Extension, Long Arc Quad (Weight)  Place weight around leg. Raise leg until knee is straight. Hold _5__ seconds. Use ___ lb weight. _10__ reps per set (each leg), 4-5__ sets per day, __7_ days per week   Knee Raise   Lift knee and then lower it. Repeat with other knee. Repeat _10__ times each leg. Do _4-5___ sessions per day.  http://gt2.exer.us/445   Copyright  VHI. All rights reserved.  Toe Up   Gently rise up on toes and back on heels. Repeat _20___ times. Do 4-5____ sessions per day.   Perform all exercises below:  Hold _20___ seconds. Repeat _3___ times.  Do __3__ sessions per day. CAUTION: Movement should be gentle, steady and slow.  Knee to Chest  Lying supine, bend involved knee to chest. Perform with each leg.   Lumbar Rotation: Caudal - Bilateral (Supine)  Feet and knees together, arms outstretched, rotate knees left, turning head in opposite direction, until stretch is felt.      HIP: Hamstrings - Short Sitting   Rest leg on raised surface. Keep knee straight. Lift chest.   [facility] [address] [place], [place] [address] Phone # [phone] Fax [phone]

## 2017-09-07 NOTE — Therapy (Signed)
[facility] [address], [place], [place], [address] Phone: [phone]   Fax:  [phone]  Physical Therapy Evaluation  Patient Details  Name: Glen Bolton MRN: [id number] Date of Birth: 1941/01/12 Referring Provider: Lujean Amel, MD   Encounter Date: 09/07/2017  PT End of Session - 09/07/17 0947    Visit Number  1    Date for PT Re-Evaluation  11/02/17    Authorization Type  Medicare    PT Start Time  0850    PT Stop Time  0928    PT Time Calculation (min)  38 min    Activity Tolerance  Patient tolerated treatment well    Behavior During Therapy  [facility] for tasks assessed/performed       Past Medical History:  Diagnosis Date  . Arthritis   . Atrial fibrillation ([place])   . Dysrhythmia   . GERD (gastroesophageal reflux disease)   . Glaucoma   . Hard of hearing   . History of gastritis   . History of kidney stones   . History of stomach ulcers   . Hx of migraines    no migraines since he was in his 18's  . Hypertension   . Sleep apnea    CPAP    Past Surgical History:  Procedure Laterality Date  . CATARACT EXTRACTION Bilateral   . COLONOSCOPY    . ESOPHAGOGASTRODUODENOSCOPY    . INGUINAL HERNIA REPAIR Left    x 2  . Rotator cuff surgery Right 2013  . TOTAL HIP ARTHROPLASTY Left 10/27/2015   Procedure: TOTAL HIP ARTHROPLASTY ANTERIOR APPROACH;  Surgeon: Frederik Pear, MD;  Location: [place];  Service: Orthopedics;  Laterality: Left;    There were no vitals filed for this visit.   Subjective Assessment - 09/07/17 0854    Subjective  Pt presents to PT with complaints of LE weakness and imbalance espeically after sitting for a while and coming to standing.  Pt denies any dizziness or other systemic symptoms.  Pt also reports chronic LBP.      Pertinent History  Lt THA 11/2015    Limitations  Standing    How long can you stand comfortably?  legs swell with long periods of standing-limited to 1-2 hours at  work    How long can you walk comfortably?  fatigue with walking across [employer]-3-5 minutes    Diagnostic tests  none    Patient Stated Goals  improve balance, improve leg strength, reduce LBP    Currently in Pain?  Yes    Pain Score  0-No pain    Pain Location  Back    Pain Orientation  Left;Right;Lower    Pain Descriptors / Indicators  Aching    Pain Type  Chronic pain    Pain Onset  More than a month ago    Pain Frequency  Intermittent    Aggravating Factors   sitting too long, lifting    Pain Relieving Factors  Tylenol, sitting in recliner         [facility] PT Assessment - 09/07/17 0001      Assessment   Medical Diagnosis  imbalance, lumbar disc disease, weakness of both lower limbs    Referring Provider  Lujean Amel, MD    Onset Date/Surgical Date  06/07/17    Next MD Visit  -- neuro MD 10/2017      Precautions   Precautions  Fall      Restrictions   Weight Bearing Restrictions  No      Balance Screen   Has the patient fallen in the past 6 months  No    Has the patient had a decrease in activity level because of a fear of falling?   No    Is the patient reluctant to leave their home because of a fear of falling?   No      Home Environment   Living Environment  Private residence    Living Arrangements  Spouse/significant other    Type of [place] Access  Level entry    Home Layout  One level    [place] - single point      Prior Function   Level of [place]  Part time employment    Scientist, product/process development course- standing/sitting    Leisure  golf      Cognition   Overall Cognitive Status  Within Functional Limits for tasks assessed      Posture/Postural Control   Posture/Postural Control  Postural limitations    Postural Limitations  Forward head;Decreased lumbar lordosis      ROM / Strength   AROM / PROM / Strength  AROM;PROM;Strength      AROM   Overall AROM   Deficits    Overall AROM  Comments  lumbar A/ROM limited by 25% in all directions with central lumbar pain reported      PROM   Overall PROM   Deficits    Overall PROM Comments  bil hip flexibility is limited: 75% into IR, 25% ER and 50% into hip flexion (SLR)      Strength   Overall Strength  Deficits    Strength Assessment Site  Hip;Knee;Ankle    Right/Left Hip  Right;Left    Right Hip Flexion  4/5    Right Hip Extension  4+/5    Left Hip Flexion  4-/5    Left Hip Extension  4/5    Right/Left Knee  Right;Left    Right Knee Flexion  4/5    Right Knee Extension  4/5    Left Knee Flexion  4/5    Left Knee Extension  4-/5    Right/Left Ankle  Right;Left    Right Ankle Dorsiflexion  4+/5    Left Ankle Dorsiflexion  4+/5      Palpation   Spinal mobility  reduced PA mobility in the thoracic and lumbar spine.  Mild discomfort reported with PA mobs L3-5.      Palpation comment  tension and trigger points in bil lumbar paraspinals.  No tenderness in bil gluteals      Transfers   Five time sit to stand comments   13.91 seconds      Ambulation/Gait   Ambulation/Gait  Yes    Ambulation/Gait Assistance  7: Independent    Gait Pattern  Step-through pattern;Decreased stride length      High Level Balance   High Level Balance Comments  single leg stance Rt 3 seconds, Lt 2 seconds             Objective measurements completed on examination: See above findings.              PT Education - 09/07/17 0924    Education provided  Yes    Education Details  lumbar flexibility, seated LE strength    Person(s) Educated  Patient    Methods  Explanation;Demonstration;Handout    Comprehension  Verbalized understanding;Returned demonstration       PT Short Term Goals - 09/07/17 0925      PT SHORT TERM GOAL #1   Title  be independent in initial HEP    Time  4    Period  Weeks    Status  New    Target Date  10/05/17      PT SHORT TERM GOAL #2   Title  report a 25% reduction in LBP with sitting  and standing at work    Time  4    Period  Weeks    Status  New    Target Date  10/05/17      PT SHORT TERM GOAL #3   Title  perform 5x sit to stand in < or = to 13 seconds to improve balance    Time  4    Period  Weeks    Status  New    Target Date  10/05/17        PT Long Term Goals - 09/07/17 0926      PT LONG TERM GOAL #1   Title  be independent in advanced HEP    Time  8    Period  Weeks    Status  New    Target Date  11/02/17      PT LONG TERM GOAL #2   Title  perform single leg stance on Rt and Lt for 6-8 seconds to improve balance and safety    Time  8    Period  Weeks    Status  New    Target Date  11/02/17      PT LONG TERM GOAL #3   Title  report a 60% reduction in LBP with standing and walking    Time  8    Period  Weeks    Status  New    Target Date  11/02/17      PT LONG TERM GOAL #4   Title  improve LE strength to stand and walk for 10-15 minutes without fatigue    Time  8    Period  Weeks    Status  New    Target Date  11/02/17      PT LONG TERM GOAL #5   Title  perform sit to standing in < or = to 12 seconds to improve balance    Time  8    Period  Weeks    Status  New    Target Date  11/02/17      Additional Long Term Goals   Additional Long Term Goals  Yes      PT LONG TERM GOAL #6   Title  report a 50% reduction in LBP with standing and walking     Time  8    Period  Weeks    Status  New    Target Date  11/02/17      PT LONG TERM GOAL #7   Title  demonstrate 4 to 4+/5 LE strength to improve endurance and strength    Time  8    Period  Weeks    Status  New    Target Date  11/02/17             Plan - 09/07/17 1517    Clinical Impression Statement  Pt presents to PT with complaints of LE weakness and imbalance that began 3 months ago and chronic LBP.  Pt reports that he feels off balance when  he stands up from the sitting position.  Pt demonstrates weakness in bil. LEs, single leg stance is 2-3 seconds, and 5x sit to  stand is 13.91 seconds.  Pt with hip stiffness bilaterally and palpalbe lumbar tension and pain.  Pt will benefit from skilled PT to improve LE strength, flexibility and balance to improve safety and reduce LBP.    History and Personal Factors relevant to plan of care:  LBP, history of Lt THA, recent LE weakness and imbalance    Clinical Presentation  Evolving    Clinical Presentation due to:  weakening LEs over the past 3 months    Clinical Decision Making  Moderate    Rehab Potential  Good    PT Frequency  2x / week    PT Duration  8 weeks    PT Treatment/Interventions  ADLs/Self Care Home Management;Cryotherapy;Electrical Stimulation;Ultrasound;Traction;Moist Heat;Gait training;Stair training;Functional mobility training;Therapeutic activities;Therapeutic exercise;Balance training;Patient/family education;Neuromuscular re-education;Manual techniques;Passive range of motion;Taping;Dry needling    PT Next Visit Plan  LE strength, NuStep, core strength, hip flexibility, balance exercises    Consulted and Agree with Plan of Care  Patient       Patient will benefit from skilled therapeutic intervention in order to improve the following deficits and impairments:  Pain, Impaired flexibility, Difficulty walking, Decreased safety awareness, Decreased strength, Decreased activity tolerance, Decreased endurance, Decreased range of motion, Postural dysfunction, Increased muscle spasms, Improper body mechanics, Abnormal gait  Visit Diagnosis: Muscle weakness (generalized) - Plan: PT plan of care cert/re-cert  Difficulty in walking, not elsewhere classified - Plan: PT plan of care cert/re-cert  Chronic bilateral low back pain without sciatica - Plan: PT plan of care cert/re-cert     Problem List Patient Active Problem List   Diagnosis Date Noted  . Primary osteoarthritis of left hip 10/25/2015  . Atrial fibrillation ([place]) 08/26/2015    Glen Bolton, PT 09/07/17 9:47 AM  [facility] [address] [place], [place], [address] Phone: [phone]   Fax:  [phone]  Name: Glen Bolton MRN: [id number] Date of Birth: [birth year]

## 2017-09-08 ENCOUNTER — Ambulatory Visit: Payer: Medicare Other | Admitting: Physical Therapy

## 2017-09-08 ENCOUNTER — Encounter: Payer: Self-pay | Admitting: Physical Therapy

## 2017-09-08 DIAGNOSIS — M545 Low back pain, unspecified: Secondary | ICD-10-CM

## 2017-09-08 DIAGNOSIS — G8929 Other chronic pain: Secondary | ICD-10-CM | POA: Diagnosis not present

## 2017-09-08 DIAGNOSIS — M6281 Muscle weakness (generalized): Secondary | ICD-10-CM | POA: Diagnosis not present

## 2017-09-08 DIAGNOSIS — R262 Difficulty in walking, not elsewhere classified: Secondary | ICD-10-CM

## 2017-09-08 NOTE — Therapy (Signed)
[facility] [address], [place], [place], [address] Phone: [phone]   Fax:  [phone]  Physical Therapy Treatment  Patient Details  Name: Glen Bolton MRN: [id number] Date of Birth: August 10, 1941 Referring Provider: Lujean Amel, MD   Encounter Date: 09/08/2017  PT End of Session - 09/08/17 0903    Visit Number  2    Date for PT Re-Evaluation  11/02/17    Authorization Type  Medicare    PT Start Time  0846    PT Stop Time  0925    PT Time Calculation (min)  39 min    Activity Tolerance  Patient tolerated treatment well;No increased pain    Behavior During Therapy  WFL for tasks assessed/performed       Past Medical History:  Diagnosis Date  . Arthritis   . Atrial fibrillation ([place])   . Dysrhythmia   . GERD (gastroesophageal reflux disease)   . Glaucoma   . Hard of hearing   . History of gastritis   . History of kidney stones   . History of stomach ulcers   . Hx of migraines    no migraines since he was in his 80's  . Hypertension   . Sleep apnea    CPAP    Past Surgical History:  Procedure Laterality Date  . CATARACT EXTRACTION Bilateral   . COLONOSCOPY    . ESOPHAGOGASTRODUODENOSCOPY    . INGUINAL HERNIA REPAIR Left    x 2  . Rotator cuff surgery Right 2013  . TOTAL HIP ARTHROPLASTY Left 10/27/2015   Procedure: TOTAL HIP ARTHROPLASTY ANTERIOR APPROACH;  Surgeon: Frederik Pear, MD;  Location: [place];  Service: Orthopedics;  Laterality: Left;    There were no vitals filed for this visit.  Subjective Assessment - 09/08/17 0848    Subjective  Pt reports that things are going well. He was able to try his HEP provided yesterday at his evaluation without any difficulty.     Pertinent History  Lt THA 11/2015    Limitations  Standing    How long can you stand comfortably?  legs swell with long periods of standing-limited to 1-2 hours at work    How long can you walk comfortably?  fatigue with walking  across [employer]-3-5 minutes    Diagnostic tests  none    Patient Stated Goals  improve balance, improve leg strength, reduce LBP    Currently in Pain?  No/denies    Pain Onset  More than a month ago                      [facility] Adult PT Treatment/Exercise - 09/08/17 0001      Self-Care   Self-Care  Posture    Posture  log roll x2 trials each direction      Exercises   Exercises  Lumbar      Lumbar Exercises: Stretches   Active Hamstring Stretch  10 seconds;Limitations    Active Hamstring Stretch Limitations  knee 90/90 position x10 reps each LE     Hip Flexor Stretch  3 reps;20 seconds    Hip Flexor Stretch Limitations  thomas testing position       Lumbar Exercises: Aerobic   Stationary Bike  Nustep L2 x6 min PT present to discuss HEP completion/session       Lumbar Exercises: Supine   Bridge  10 reps;Limitations    Bridge Limitations  x2 sets with therapist resistance into extension and  green TB around knees     Large Ball Oblique Isometric  15 reps;3 seconds      Lumbar Exercises: Sidelying   Clam  15 reps    Clam Limitations  green TB with abdominal bracing              PT Education - 09/08/17 0902    Education provided  Yes    Education Details  implications for LE flexibility and strengthening exercise    Person(s) Educated  Patient    Methods  Explanation    Comprehension  Verbalized understanding       PT Short Term Goals - 09/07/17 0925      PT SHORT TERM GOAL #1   Title  be independent in initial HEP    Time  4    Period  Weeks    Status  New    Target Date  10/05/17      PT SHORT TERM GOAL #2   Title  report a 25% reduction in LBP with sitting and standing at work    Time  4    Period  Weeks    Status  New    Target Date  10/05/17      PT SHORT TERM GOAL #3   Title  perform 5x sit to stand in < or = to 13 seconds to improve balance    Time  4    Period  Weeks    Status  New    Target Date  10/05/17        PT Long Term  Goals - 09/07/17 0926      PT LONG TERM GOAL #1   Title  be independent in advanced HEP    Time  8    Period  Weeks    Status  New    Target Date  11/02/17      PT LONG TERM GOAL #2   Title  perform single leg stance on Rt and Lt for 6-8 seconds to improve balance and safety    Time  8    Period  Weeks    Status  New    Target Date  11/02/17      PT LONG TERM GOAL #3   Title  report a 60% reduction in LBP with standing and walking    Time  8    Period  Weeks    Status  New    Target Date  11/02/17      PT LONG TERM GOAL #4   Title  improve LE strength to stand and walk for 10-15 minutes without fatigue    Time  8    Period  Weeks    Status  New    Target Date  11/02/17      PT LONG TERM GOAL #5   Title  perform sit to standing in < or = to 12 seconds to improve balance    Time  8    Period  Weeks    Status  New    Target Date  11/02/17      Additional Long Term Goals   Additional Long Term Goals  Yes      PT LONG TERM GOAL #6   Title  report a 50% reduction in LBP with standing and walking     Time  8    Period  Weeks    Status  New    Target Date  11/02/17  PT LONG TERM GOAL #7   Title  demonstrate 4 to 4+/5 LE strength to improve endurance and strength    Time  8    Period  Weeks    Status  New    Target Date  11/02/17            Plan - 09/08/17 0909    Clinical Impression Statement  Pt arrives today only 1 day following his evaluation, with minimal change in symptoms/pain. Session focused on improving hip flexibility and strength, pt demonstrating no significant difficulty with any of this. Did note poor mechanics with transitions on/off the mat, which the therapist was able to address with education on proper log roll technique. He did demo good understanding of this concept and handout was provided to further reinforce this at home. Ended without any reports of pain.     Rehab Potential  Good    PT Frequency  2x / week    PT Duration  8 weeks     PT Treatment/Interventions  ADLs/Self Care Home Management;Cryotherapy;Electrical Stimulation;Ultrasound;Traction;Moist Heat;Gait training;Stair training;Functional mobility training;Therapeutic activities;Therapeutic exercise;Balance training;Patient/family education;Neuromuscular re-education;Manual techniques;Passive range of motion;Taping;Dry needling    PT Next Visit Plan  LE strength progression, NuStep, core strength, hip flexibility, balance exercises    Recommended Other Services  possible vestibular treatment needed     Consulted and Agree with Plan of Care  Patient       Patient will benefit from skilled therapeutic intervention in order to improve the following deficits and impairments:  Pain, Impaired flexibility, Difficulty walking, Decreased safety awareness, Decreased strength, Decreased activity tolerance, Decreased endurance, Decreased range of motion, Postural dysfunction, Increased muscle spasms, Improper body mechanics, Abnormal gait  Visit Diagnosis: Muscle weakness (generalized)  Difficulty in walking, not elsewhere classified  Chronic bilateral low back pain without sciatica     Problem List Patient Active Problem List   Diagnosis Date Noted  . Primary osteoarthritis of left hip 10/25/2015  . Atrial fibrillation ([place]) 08/26/2015    9:27 AM,09/08/17 Elly Modena PT, DPT [facility] [address] [place], [place], [address] Phone: [phone]   Fax:  [phone]  Name: Glen Bolton MRN: [id number] Date of Birth: [birth year]

## 2017-09-08 NOTE — Patient Instructions (Signed)
   ADL - LOG ROLL  GETTING IN BED: Start by sitting on the edge of the bed. Next, lower your self down lying on your side using your arms. Once fully on your side, roll onto your back.  When rolling be sure y our knees stay bent and that you roll your whole body together as one unit. Your shoulders, pelvis and knees all roll as one.    GETTING OUT OF BED: Start by bending your knees and then roll onto your side. Reach your arm across your body to initiate the rolling. When rolling, be sure that you roll your whole body together as one unit. Your shoulders, pelvis and knees should all roll together. Once on our side, tip yourself up to sitting using your arms.       [facility] [address], [place], [place] [address] Phone # [phone] Fax [phone]

## 2017-09-13 ENCOUNTER — Encounter: Payer: Self-pay | Admitting: Physical Therapy

## 2017-09-13 ENCOUNTER — Ambulatory Visit: Payer: Medicare Other | Admitting: Physical Therapy

## 2017-09-13 DIAGNOSIS — R262 Difficulty in walking, not elsewhere classified: Secondary | ICD-10-CM

## 2017-09-13 DIAGNOSIS — G8929 Other chronic pain: Secondary | ICD-10-CM

## 2017-09-13 DIAGNOSIS — M545 Low back pain: Secondary | ICD-10-CM

## 2017-09-13 DIAGNOSIS — M6281 Muscle weakness (generalized): Secondary | ICD-10-CM | POA: Diagnosis not present

## 2017-09-13 NOTE — Patient Instructions (Signed)

## 2017-09-13 NOTE — Therapy (Signed)
[facility] [address] [place], [place], [address] Phone: [phone]   Fax:  [phone]  Physical Therapy Treatment  Patient Details  Name: Glen Bolton MRN: [id number] Date of Birth: 31-Jan-1941 Referring Provider: Lujean Amel, MD   Encounter Date: 09/13/2017  PT End of Session - 09/13/17 0911    Visit Number  3    Date for PT Re-Evaluation  11/02/17    Authorization Type  Medicare    PT Start Time  0840    PT Stop Time  0931    PT Time Calculation (min)  51 min    Activity Tolerance  Patient tolerated treatment well       Past Medical History:  Diagnosis Date  . Arthritis   . Atrial fibrillation ([place])   . Dysrhythmia   . GERD (gastroesophageal reflux disease)   . Glaucoma   . Hard of hearing   . History of gastritis   . History of kidney stones   . History of stomach ulcers   . Hx of migraines    no migraines since he was in his 11's  . Hypertension   . Sleep apnea    CPAP    Past Surgical History:  Procedure Laterality Date  . CATARACT EXTRACTION Bilateral   . COLONOSCOPY    . ESOPHAGOGASTRODUODENOSCOPY    . INGUINAL HERNIA REPAIR Left    x 2  . Rotator cuff surgery Right 2013  . TOTAL HIP ARTHROPLASTY Left 10/27/2015   Procedure: TOTAL HIP ARTHROPLASTY ANTERIOR APPROACH;  Surgeon: Frederik Pear, MD;  Location: [place];  Service: Orthopedics;  Laterality: Left;    There were no vitals filed for this visit.  Subjective Assessment - 09/13/17 0840    Subjective  My back bothers me at the end of the day.   I took something and put on some blue Emu.  Not hurting much right now.  I don't think the therapy is bothering my back.      Currently in Pain?  Yes    Pain Score  2     Pain Location  Back    Pain Type  Chronic pain    Aggravating Factors   end of the day    Pain Relieving Factors  medicine, blue Emu                      [facility] Adult PT Treatment/Exercise - 09/13/17 0001       Therapeutic Activites    Therapeutic Activities  ADL's    ADL's  rolling, sit to stand, walking, standing      Neuro Re-ed    Neuro Re-ed Details   transversus abdominus and gluteal muscle activation       Lumbar Exercises: Stretches   Active Hamstring Stretch  2 reps;30 seconds    Active Hamstring Stretch Limitations  with strap    Hip Flexor Stretch  3 reps;20 seconds    Hip Flexor Stretch Limitations  knee to chest with opp leg straight      Lumbar Exercises: Aerobic   Stationary Bike  Nustep L2 x6 min PT present to discuss HEP completion/session       Lumbar Exercises: Seated   Other Seated Lumbar Exercises  foam roll push down     Other Seated Lumbar Exercises  red band knee extension, and hip flexion 10x each right/left      Lumbar Exercises: Supine   Clam  20 reps green band  Isometric Hip Flexion  5 reps      Moist Heat Therapy   Number Minutes Moist Heat  15 Minutes    Moist Heat Location  Lumbar Spine      Electrical Stimulation   Electrical Stimulation Location  bil lumbar    Electrical Stimulation Action  IFC    Electrical Stimulation Parameters  12 ma 15 min seated    Electrical Stimulation Goals  Pain             PT Education - 09/13/17 [phone]    Education provided  Yes    Education Details  home TENS unit info    Person(s) Educated  Patient    Methods  Explanation;Demonstration;Handout    Comprehension  Verbalized understanding;Returned demonstration       PT Short Term Goals - 09/07/17 0925      PT SHORT TERM GOAL #1   Title  be independent in initial HEP    Time  4    Period  Weeks    Status  New    Target Date  10/05/17      PT SHORT TERM GOAL #2   Title  report a 25% reduction in LBP with sitting and standing at work    Time  4    Period  Weeks    Status  New    Target Date  10/05/17      PT SHORT TERM GOAL #3   Title  perform 5x sit to stand in < or = to 13 seconds to improve balance    Time  4    Period  Weeks    Status   New    Target Date  10/05/17        PT Long Term Goals - 09/07/17 0926      PT LONG TERM GOAL #1   Title  be independent in advanced HEP    Time  8    Period  Weeks    Status  New    Target Date  11/02/17      PT LONG TERM GOAL #2   Title  perform single leg stance on Rt and Lt for 6-8 seconds to improve balance and safety    Time  8    Period  Weeks    Status  New    Target Date  11/02/17      PT LONG TERM GOAL #3   Title  report a 60% reduction in LBP with standing and walking    Time  8    Period  Weeks    Status  New    Target Date  11/02/17      PT LONG TERM GOAL #4   Title  improve LE strength to stand and walk for 10-15 minutes without fatigue    Time  8    Period  Weeks    Status  New    Target Date  11/02/17      PT LONG TERM GOAL #5   Title  perform sit to standing in < or = to 12 seconds to improve balance    Time  8    Period  Weeks    Status  New    Target Date  11/02/17      Additional Long Term Goals   Additional Long Term Goals  Yes      PT LONG TERM GOAL #6   Title  report a 50% reduction in LBP with standing and walking  Time  8    Period  Weeks    Status  New    Target Date  11/02/17      PT LONG TERM GOAL #7   Title  demonstrate 4 to 4+/5 LE strength to improve endurance and strength    Time  8    Period  Weeks    Status  New    Target Date  11/02/17            Plan - 09/13/17 7510    Clinical Impression Statement  The patient is able to do low level supine and sitting lumbo/pelvic/hip core strengthening exercises.  He needs a few seconds of being still following transitional movements secondary to dizziness.  Good response to electrical stimulation/heat in clinic.  He is interested in getting a home TENs for pain control for his back and was given info on ordering.  Should be able to progress to standng exercises.      Rehab Potential  Good    PT Frequency  2x / week    PT Duration  8 weeks    PT Treatment/Interventions   ADLs/Self Care Home Management;Cryotherapy;Electrical Stimulation;Ultrasound;Traction;Moist Heat;Gait training;Stair training;Functional mobility training;Therapeutic activities;Therapeutic exercise;Balance training;Patient/family education;Neuromuscular re-education;Manual techniques;Passive range of motion;Taping;Dry needling    PT Next Visit Plan  LE strength progression to standing as tolerated;  NuStep, core strength, hip flexibility, balance exercises       Patient will benefit from skilled therapeutic intervention in order to improve the following deficits and impairments:  Pain, Impaired flexibility, Difficulty walking, Decreased safety awareness, Decreased strength, Decreased activity tolerance, Decreased endurance, Decreased range of motion, Postural dysfunction, Increased muscle spasms, Improper body mechanics, Abnormal gait  Visit Diagnosis: Muscle weakness (generalized)  Difficulty in walking, not elsewhere classified  Chronic bilateral low back pain without sciatica     Problem List Patient Active Problem List   Diagnosis Date Noted  . Primary osteoarthritis of left hip 10/25/2015  . Atrial fibrillation ([place]) 08/26/2015   Ruben Im, PT 09/13/17 12:56 PM Phone: [phone] Fax: [phone]  Alvera Singh 09/13/2017, 12:55 PM  [facility] [address] [place], [place], [address] Phone: [phone]   Fax:  [phone]  Name: Glen Bolton MRN: [id number] Date of Birth: 02-07-1941

## 2017-09-15 ENCOUNTER — Ambulatory Visit: Payer: Medicare Other | Admitting: Physical Therapy

## 2017-09-15 ENCOUNTER — Encounter: Payer: Self-pay | Admitting: Physical Therapy

## 2017-09-15 DIAGNOSIS — M545 Low back pain, unspecified: Secondary | ICD-10-CM

## 2017-09-15 DIAGNOSIS — M6281 Muscle weakness (generalized): Secondary | ICD-10-CM

## 2017-09-15 DIAGNOSIS — R262 Difficulty in walking, not elsewhere classified: Secondary | ICD-10-CM | POA: Diagnosis not present

## 2017-09-15 DIAGNOSIS — G8929 Other chronic pain: Secondary | ICD-10-CM | POA: Diagnosis not present

## 2017-09-15 NOTE — Therapy (Signed)
[facility] [address] [place], [place], [address] Phone: [phone]   Fax:  [phone]  Physical Therapy Treatment  Patient Details  Name: Glen Bolton MRN: [id number] Date of Birth: 12-05-1940 Referring Provider: Lujean Amel, MD   Encounter Date: 09/15/2017  PT End of Session - 09/15/17 0904    Visit Number  4    Date for PT Re-Evaluation  11/02/17    Authorization Type  Medicare    PT Start Time  0844    PT Stop Time  0940    PT Time Calculation (min)  56 min    Activity Tolerance  Patient tolerated treatment well;No increased pain    Behavior During Therapy  WFL for tasks assessed/performed       Past Medical History:  Diagnosis Date  . Arthritis   . Atrial fibrillation ([place])   . Dysrhythmia   . GERD (gastroesophageal reflux disease)   . Glaucoma   . Hard of hearing   . History of gastritis   . History of kidney stones   . History of stomach ulcers   . Hx of migraines    no migraines since he was in his 67's  . Hypertension   . Sleep apnea    CPAP    Past Surgical History:  Procedure Laterality Date  . CATARACT EXTRACTION Bilateral   . COLONOSCOPY    . ESOPHAGOGASTRODUODENOSCOPY    . INGUINAL HERNIA REPAIR Left    x 2  . Rotator cuff surgery Right 2013  . TOTAL HIP ARTHROPLASTY Left 10/27/2015   Procedure: TOTAL HIP ARTHROPLASTY ANTERIOR APPROACH;  Surgeon: Frederik Pear, MD;  Location: [place];  Service: Orthopedics;  Laterality: Left;    There were no vitals filed for this visit.  Subjective Assessment - 09/15/17 0847    Subjective  Pt reports that his exercises are going well. He has not noticed any pain in his low back since his last session.     Currently in Pain?  No/denies                      [facility] Adult PT Treatment/Exercise - 09/15/17 0001      Lumbar Exercises: Standing   Other Standing Lumbar Exercises  sidestepping at counter with red TB around ankles x2  RT    Other Standing Lumbar Exercises  hip extension x15 reps each with UE support      Lumbar Exercises: Seated   Sit to Stand  10 reps;Limitations    Sit to Stand Limitations  x2 sets on foam pad     Other Seated Lumbar Exercises  sitting dyna disc, rows with red TB x10 reps, horizontal abduction with red TB x10 reps     Other Seated Lumbar Exercises  red TB hip flexion x10 reps Lt/Rt;       Lumbar Exercises: Supine   Isometric Hip Flexion  10 reps;3 seconds    Other Supine Lumbar Exercises  low trunk rotation to the Rt (hooklying) x15 reps    Other Supine Lumbar Exercises  low trunk rotation Lt/Rt with LE on red physioball x15 reps Lt/Rt with therapist assistance      Moist Heat Therapy   Number Minutes Moist Heat  15 Minutes    Moist Heat Location  Lumbar Spine      Electrical Stimulation   Electrical Stimulation Location  2 channel bil lumbar/sacral (crossed)    Electrical Stimulation Action  IFC  Electrical Stimulation Parameters  83ma 15 min    Electrical Stimulation Goals  Pain          Balance Exercises - 09/15/17 0915      Balance Exercises: Standing   Standing Eyes Closed  Narrow base of support (BOS);Foam/compliant surface;2 reps;30 secs    Tandem Stance  3 reps;30 secs;Intermittent upper extremity support;Eyes open        PT Education - 09/15/17 0903    Education provided  Yes    Education Details  noted limitations in trunk rotation ROM likely secondary to Rt handed golf swing    Person(s) Educated  Patient    Methods  Explanation    Comprehension  Verbalized understanding       PT Short Term Goals - 09/15/17 0910      PT SHORT TERM GOAL #1   Title  be independent in initial HEP    Time  4    Period  Weeks    Status  Achieved      PT SHORT TERM GOAL #2   Title  report a 25% reduction in LBP with sitting and standing at work    Time  4    Period  Weeks    Status  On-going      PT SHORT TERM GOAL #3   Title  perform 5x sit to stand in < or =  to 13 seconds to improve balance    Time  4    Period  Weeks    Status  On-going        PT Long Term Goals - 09/07/17 0926      PT LONG TERM GOAL #1   Title  be independent in advanced HEP    Time  8    Period  Weeks    Status  New    Target Date  11/02/17      PT LONG TERM GOAL #2   Title  perform single leg stance on Rt and Lt for 6-8 seconds to improve balance and safety    Time  8    Period  Weeks    Status  New    Target Date  11/02/17      PT LONG TERM GOAL #3   Title  report a 60% reduction in LBP with standing and walking    Time  8    Period  Weeks    Status  New    Target Date  11/02/17      PT LONG TERM GOAL #4   Title  improve LE strength to stand and walk for 10-15 minutes without fatigue    Time  8    Period  Weeks    Status  New    Target Date  11/02/17      PT LONG TERM GOAL #5   Title  perform sit to standing in < or = to 12 seconds to improve balance    Time  8    Period  Weeks    Status  New    Target Date  11/02/17      Additional Long Term Goals   Additional Long Term Goals  Yes      PT LONG TERM GOAL #6   Title  report a 50% reduction in LBP with standing and walking     Time  8    Period  Weeks    Status  New    Target Date  11/02/17  PT LONG TERM GOAL #7   Title  demonstrate 4 to 4+/5 LE strength to improve endurance and strength    Time  8    Period  Weeks    Status  New    Target Date  11/02/17            Plan - 09/15/17 0388    Clinical Impression Statement  Pt arrived today reporting no pain since his last session 2 days ago. Session continued with therex to promote hip strengthening and lumbar ROM. Pt was able to complete standing therex this session with minimal low back irritation. Noted restricted lumbar rotation ROM during therex to the Rt greater than to the Lt. Will continue with current POC.     Rehab Potential  Good    PT Frequency  2x / week    PT Duration  8 weeks    PT Treatment/Interventions   ADLs/Self Care Home Management;Cryotherapy;Electrical Stimulation;Ultrasound;Traction;Moist Heat;Gait training;Stair training;Functional mobility training;Therapeutic activities;Therapeutic exercise;Balance training;Patient/family education;Neuromuscular re-education;Manual techniques;Passive range of motion;Taping;Dry needling    PT Next Visit Plan  LE strength progression in standing as tolerated;  mobilizations/therex to improve trunk rotation to the Rt; NuStep, core strength, hip flexibility, balance exercises    Consulted and Agree with Plan of Care  Patient       Patient will benefit from skilled therapeutic intervention in order to improve the following deficits and impairments:  Pain, Impaired flexibility, Difficulty walking, Decreased safety awareness, Decreased strength, Decreased activity tolerance, Decreased endurance, Decreased range of motion, Postural dysfunction, Increased muscle spasms, Improper body mechanics, Abnormal gait  Visit Diagnosis: Muscle weakness (generalized)  Difficulty in walking, not elsewhere classified  Chronic bilateral low back pain without sciatica     Problem List Patient Active Problem List   Diagnosis Date Noted  . Primary osteoarthritis of left hip 10/25/2015  . Atrial fibrillation ([place]) 08/26/2015    9:27 AM,09/15/17 Elly Modena PT, DPT [facility] [place] [address] [place], [place], [address] Phone: [phone]   Fax:  [phone]  Name: Glen Bolton MRN: [id number] Date of Birth: [birth year]

## 2017-09-20 ENCOUNTER — Encounter: Payer: Self-pay | Admitting: Physical Therapy

## 2017-09-20 ENCOUNTER — Ambulatory Visit: Payer: Medicare Other | Admitting: Physical Therapy

## 2017-09-20 DIAGNOSIS — G8929 Other chronic pain: Secondary | ICD-10-CM

## 2017-09-20 DIAGNOSIS — R262 Difficulty in walking, not elsewhere classified: Secondary | ICD-10-CM

## 2017-09-20 DIAGNOSIS — M6281 Muscle weakness (generalized): Secondary | ICD-10-CM

## 2017-09-20 DIAGNOSIS — M545 Low back pain, unspecified: Secondary | ICD-10-CM

## 2017-09-20 NOTE — Therapy (Signed)
[facility] [address], [place], [place], [address] Phone: [phone]   Fax:  [phone]  Physical Therapy Treatment  Patient Details  Name: Glen Bolton MRN: [id number] Date of Birth: 01-Dec-1940 Referring Provider: Lujean Amel, MD   Encounter Date: 09/20/2017  PT End of Session - 09/20/17 0859    Visit Number  5    Date for PT Re-Evaluation  11/02/17    Authorization Type  Medicare    PT Start Time  0846    PT Stop Time  0930    PT Time Calculation (min)  44 min    Activity Tolerance  Patient tolerated treatment well;No increased pain    Behavior During Therapy  WFL for tasks assessed/performed       Past Medical History:  Diagnosis Date  . Arthritis   . Atrial fibrillation ([place])   . Dysrhythmia   . GERD (gastroesophageal reflux disease)   . Glaucoma   . Hard of hearing   . History of gastritis   . History of kidney stones   . History of stomach ulcers   . Hx of migraines    no migraines since he was in his 50's  . Hypertension   . Sleep apnea    CPAP    Past Surgical History:  Procedure Laterality Date  . CATARACT EXTRACTION Bilateral   . COLONOSCOPY    . ESOPHAGOGASTRODUODENOSCOPY    . INGUINAL HERNIA REPAIR Left    x 2  . Rotator cuff surgery Right 2013  . TOTAL HIP ARTHROPLASTY Left 10/27/2015   Procedure: TOTAL HIP ARTHROPLASTY ANTERIOR APPROACH;  Surgeon: Frederik Pear, MD;  Location: [place];  Service: Orthopedics;  Laterality: Left;    There were no vitals filed for this visit.  Subjective Assessment - 09/20/17 0847    Subjective  Pt reports that his back has been good even since his last session. He has gotten his own TENS unit but did not need to use it because his back hasn't been bothering him.     Currently in Pain?  No/denies                      [facility] Adult PT Treatment/Exercise - 09/20/17 0001      Lumbar Exercises: Stretches   Lower Trunk Rotation  5  reps;10 seconds;Limitations    Lower Trunk Rotation Limitations  seated       Lumbar Exercises: Aerobic   Stationary Bike  Nustep L3 x5 min therapist discussed HEP      Lumbar Exercises: Standing   Other Standing Lumbar Exercises  sidestepping with UE support x13 ft Lt/Rt x3 round trips, red TB around feet       Lumbar Exercises: Seated   Other Seated Lumbar Exercises  seated on green physioball with low rows x15 reps (green TB), high rows x15 reps    Other Seated Lumbar Exercises  shoulder D2 flexion diagonal with red TB x10 reps each (seated on green physioball; seated Lt trunk rotation stretch x10 reps; seated trunk rotation with red TB resistance Lt/Rt x10 reps each       Lumbar Exercises: Sidelying   Other Sidelying Lumbar Exercises  thoracic rotation stretch x15 reps each direction             PT Education - 09/20/17 0859    Education provided  Yes    Education Details  technique with therex; importance of having rotation ROM in the thoracic spine  with golf swing    Person(s) Educated  Patient    Methods  Verbal cues;Demonstration;Explanation    Comprehension  Verbalized understanding;Returned demonstration       PT Short Term Goals - 09/20/17 0910      PT SHORT TERM GOAL #1   Title  be independent in initial HEP    Time  4    Period  Weeks    Status  Achieved      PT SHORT TERM GOAL #2   Title  report a 25% reduction in LBP with sitting and standing at work    Baseline  atleast 25% improved    Time  4    Period  Weeks    Status  Achieved      PT SHORT TERM GOAL #3   Title  perform 5x sit to stand in < or = to 13 seconds to improve balance    Time  4    Period  Weeks    Status  On-going        PT Long Term Goals - 09/07/17 2119      PT LONG TERM GOAL #1   Title  be independent in advanced HEP    Time  8    Period  Weeks    Status  New    Target Date  11/02/17      PT LONG TERM GOAL #2   Title  perform single leg stance on Rt and Lt for 6-8  seconds to improve balance and safety    Time  8    Period  Weeks    Status  New    Target Date  11/02/17      PT LONG TERM GOAL #3   Title  report a 60% reduction in LBP with standing and walking    Time  8    Period  Weeks    Status  New    Target Date  11/02/17      PT LONG TERM GOAL #4   Title  improve LE strength to stand and walk for 10-15 minutes without fatigue    Time  8    Period  Weeks    Status  New    Target Date  11/02/17      PT LONG TERM GOAL #5   Title  perform sit to standing in < or = to 12 seconds to improve balance    Time  8    Period  Weeks    Status  New    Target Date  11/02/17      Additional Long Term Goals   Additional Long Term Goals  Yes      PT LONG TERM GOAL #6   Title  report a 50% reduction in LBP with standing and walking     Time  8    Period  Weeks    Status  New    Target Date  11/02/17      PT LONG TERM GOAL #7   Title  demonstrate 4 to 4+/5 LE strength to improve endurance and strength    Time  8    Period  Weeks    Status  New    Target Date  11/02/17            Plan - 09/20/17 0909    Clinical Impression Statement  Pt reports up to ~2 weeks with minimal to no pain in his low back and overall feeling atleast 25%  improved. Session focused on therex to address thoracic and lumbar mobility restrictions into rotation with additions made to the pt's HEP. Pt has significantly limited thoracic rotation of no more than 20 deg each direction. Pt reported no increase in low back pain with today's exercise progressions and declined modalities end of session because of this.     Rehab Potential  Good    PT Frequency  2x / week    PT Duration  8 weeks    PT Treatment/Interventions  ADLs/Self Care Home Management;Cryotherapy;Electrical Stimulation;Ultrasound;Traction;Moist Heat;Gait training;Stair training;Functional mobility training;Therapeutic activities;Therapeutic exercise;Balance training;Patient/family education;Neuromuscular  re-education;Manual techniques;Passive range of motion;Taping;Dry needling    PT Next Visit Plan  hip flexibility; thoracic/lumbar mobilizations and ROM; LE strength progression in standing as tolerated; balance exercises    PT Home Exercise Plan  low trunk rotations; thoracic rotation; rows with green TB    Consulted and Agree with Plan of Care  Patient       Patient will benefit from skilled therapeutic intervention in order to improve the following deficits and impairments:  Pain, Impaired flexibility, Difficulty walking, Decreased safety awareness, Decreased strength, Decreased activity tolerance, Decreased endurance, Decreased range of motion, Postural dysfunction, Increased muscle spasms, Improper body mechanics, Abnormal gait  Visit Diagnosis: Muscle weakness (generalized)  Difficulty in walking, not elsewhere classified  Chronic bilateral low back pain without sciatica     Problem List Patient Active Problem List   Diagnosis Date Noted  . Primary osteoarthritis of left hip 10/25/2015  . Atrial fibrillation ([place]) 08/26/2015   9:31 AM,09/20/17 Sherol Dade PT, DPT [place] [facility] [address], [place], [place], [address] Phone: [phone]   Fax:  [phone]  Name: Glen Bolton MRN: [id number] Date of Birth: [birth year]

## 2017-09-20 NOTE — Patient Instructions (Addendum)
   ELASTIC BAND ROWS   Holding elastic band with both hands, draw back the band as you bend your elbows. Keep your elbows near the side of your body.  x15 reps.      LOWER TRUNK ROTATIONS - LTR  Lying on your back with your knees bent, gently rock your knees side-to-side. x10 reps, extra set with knees to the right.     Upper trunk rotation  Side lying with arms straight now, open your arm to rotate the upper trunk without moving your lower body. Hold and come back to starting position and repeat x15 reps.    [place] [address] [place], [place] [address] Phone # [phone] Fax [phone]

## 2017-09-22 ENCOUNTER — Encounter: Payer: Self-pay | Admitting: Physical Therapy

## 2017-09-22 ENCOUNTER — Ambulatory Visit: Payer: Medicare Other | Admitting: Physical Therapy

## 2017-09-22 DIAGNOSIS — M6281 Muscle weakness (generalized): Secondary | ICD-10-CM | POA: Diagnosis not present

## 2017-09-22 DIAGNOSIS — M545 Low back pain: Secondary | ICD-10-CM | POA: Diagnosis not present

## 2017-09-22 DIAGNOSIS — R262 Difficulty in walking, not elsewhere classified: Secondary | ICD-10-CM

## 2017-09-22 DIAGNOSIS — G8929 Other chronic pain: Secondary | ICD-10-CM | POA: Diagnosis not present

## 2017-09-22 NOTE — Therapy (Signed)
[facility] [address] [place], [place], [address] Phone: [phone]   Fax:  [phone]  Physical Therapy Treatment  Patient Details  Name: Glen Bolton MRN: [id number] Date of Birth: 08-02-1941 Referring Provider: Lujean Amel, MD   Encounter Date: 09/22/2017  PT End of Session - 09/22/17 0948    Visit Number  6    Date for PT Re-Evaluation  11/02/17    Authorization Type  Medicare    PT Start Time  0845    PT Stop Time  0928    PT Time Calculation (min)  43 min    Activity Tolerance  Patient tolerated treatment well;No increased pain    Behavior During Therapy  WFL for tasks assessed/performed       Past Medical History:  Diagnosis Date  . Arthritis   . Atrial fibrillation ([place])   . Dysrhythmia   . GERD (gastroesophageal reflux disease)   . Glaucoma   . Hard of hearing   . History of gastritis   . History of kidney stones   . History of stomach ulcers   . Hx of migraines    no migraines since he was in his 1's  . Hypertension   . Sleep apnea    CPAP    Past Surgical History:  Procedure Laterality Date  . CATARACT EXTRACTION Bilateral   . COLONOSCOPY    . ESOPHAGOGASTRODUODENOSCOPY    . INGUINAL HERNIA REPAIR Left    x 2  . Rotator cuff surgery Right 2013  . TOTAL HIP ARTHROPLASTY Left 10/27/2015   Procedure: TOTAL HIP ARTHROPLASTY ANTERIOR APPROACH;  Surgeon: Frederik Pear, MD;  Location: [facility];  Service: Orthopedics;  Laterality: Left;    There were no vitals filed for this visit.  Subjective Assessment - 09/22/17 0848    Subjective  Pt reports that his back continues to feel good, but his balance/vertigo has really been giving him issues. He has been trying to complete his self-repositioning maneuver, but this has not helped over the past day or so.     Currently in Pain?  No/denies                      [facility] Adult PT Treatment/Exercise - 09/22/17 0001      Lumbar  Exercises: Aerobic   Stationary Bike  Nustep L3, x5 min PT present to discuss session and HEP       Lumbar Exercises: Standing   Other Standing Lumbar Exercises  NBOS on foam with low rows x20 reps, 15#      Manual Therapy   Manual therapy comments  thoracic rotation with therapist overpressure x10 reps Lt/Rt; thoracic extension mobilization with movement       Vestibular Treatment/Exercise - 09/22/17 0001      Vestibular Treatment/Exercise   Vestibular Treatment Provided  Canalith Repositioning    Canalith Repositioning  Epley Manuever Right       EPLEY MANUEVER RIGHT   Number of Reps   1    Overall Response  No change    Response Details   completed to demonstrate technique with pt and implications for changes made to set up           PT Education - 09/22/17 0944    Education provided  Yes    Education Details  reviewed mechanics/technique with epley maneuver and noted limitations in cervical ROM that can limit the effectiveness of this maneuver    Person(s) Educated  Patient    Methods  Explanation;Handout    Comprehension  Verbalized understanding;Returned demonstration       PT Short Term Goals - 09/20/17 0910      PT SHORT TERM GOAL #1   Title  be independent in initial HEP    Time  4    Period  Weeks    Status  Achieved      PT SHORT TERM GOAL #2   Title  report a 25% reduction in LBP with sitting and standing at work    Baseline  atleast 25% improved    Time  4    Period  Weeks    Status  Achieved      PT SHORT TERM GOAL #3   Title  perform 5x sit to stand in < or = to 13 seconds to improve balance    Time  4    Period  Weeks    Status  On-going        PT Long Term Goals - 09/07/17 1324      PT LONG TERM GOAL #1   Title  be independent in advanced HEP    Time  8    Period  Weeks    Status  New    Target Date  11/02/17      PT LONG TERM GOAL #2   Title  perform single leg stance on Rt and Lt for 6-8 seconds to improve balance and safety     Time  8    Period  Weeks    Status  New    Target Date  11/02/17      PT LONG TERM GOAL #3   Title  report a 60% reduction in LBP with standing and walking    Time  8    Period  Weeks    Status  New    Target Date  11/02/17      PT LONG TERM GOAL #4   Title  improve LE strength to stand and walk for 10-15 minutes without fatigue    Time  8    Period  Weeks    Status  New    Target Date  11/02/17      PT LONG TERM GOAL #5   Title  perform sit to standing in < or = to 12 seconds to improve balance    Time  8    Period  Weeks    Status  New    Target Date  11/02/17      Additional Long Term Goals   Additional Long Term Goals  Yes      PT LONG TERM GOAL #6   Title  report a 50% reduction in LBP with standing and walking     Time  8    Period  Weeks    Status  New    Target Date  11/02/17      PT LONG TERM GOAL #7   Title  demonstrate 4 to 4+/5 LE strength to improve endurance and strength    Time  8    Period  Weeks    Status  New    Target Date  11/02/17            Plan - 09/22/17 0949    Clinical Impression Statement  Pt continues to report no pain in the low back with daily activity and his lumbar/thoracic mobility is improving gradually with HEP adherence. Focused some this session on addressing thoracic  mobility without report of pain. Pt has noticed recent increase in his vestibular symptoms, making him feel unsteady. Therapist reviewed his self-maneuver, noting limited cervical ROM that could be contributing to his poor results. Provided handout and made some adjustments to set-up to address this. Also introduced balance component to his session with good completion. Due to pt's improved low back pain, he could benefit from transfer to vestibular/neuro rehab facility for further evaluation/management of his vestibular issues.      Rehab Potential  Good    PT Frequency  2x / week    PT Duration  8 weeks    PT Treatment/Interventions  ADLs/Self Care Home  Management;Cryotherapy;Electrical Stimulation;Ultrasound;Traction;Moist Heat;Gait training;Stair training;Functional mobility training;Therapeutic activities;Therapeutic exercise;Balance training;Patient/family education;Neuromuscular re-education;Manual techniques;Passive range of motion;Taping;Dry needling    PT Next Visit Plan  f/u with transfer to vestibular clinic and new MD order specifically for this; balance exercises    PT Home Exercise Plan  low trunk rotations; thoracic rotation; rows with green TB    Consulted and Agree with Plan of Care  Patient       Patient will benefit from skilled therapeutic intervention in order to improve the following deficits and impairments:  Pain, Impaired flexibility, Difficulty walking, Decreased safety awareness, Decreased strength, Decreased activity tolerance, Decreased endurance, Decreased range of motion, Postural dysfunction, Increased muscle spasms, Improper body mechanics, Abnormal gait  Visit Diagnosis: Muscle weakness (generalized)  Difficulty in walking, not elsewhere classified  Chronic bilateral low back pain without sciatica     Problem List Patient Active Problem List   Diagnosis Date Noted  . Primary osteoarthritis of left hip 10/25/2015  . Atrial fibrillation ([place]) 08/26/2015   10:14 AM,09/22/17 Sherol Dade PT, DPT [facility] [address] [place], [place], [address] Phone: [phone]   Fax:  [phone]  Name: Melquiades Kovar MRN: [id number] Date of Birth: 10-Jun-1941

## 2017-09-27 ENCOUNTER — Encounter: Payer: Medicare Other | Admitting: Physical Therapy

## 2017-09-29 ENCOUNTER — Encounter: Payer: Medicare Other | Admitting: Physical Therapy

## 2017-10-04 ENCOUNTER — Encounter: Payer: Self-pay | Admitting: Physical Therapy

## 2017-10-04 ENCOUNTER — Telehealth: Payer: Self-pay | Admitting: Cardiology

## 2017-10-04 ENCOUNTER — Ambulatory Visit: Payer: Medicare Other | Admitting: Physical Therapy

## 2017-10-04 DIAGNOSIS — R262 Difficulty in walking, not elsewhere classified: Secondary | ICD-10-CM

## 2017-10-04 DIAGNOSIS — M545 Low back pain, unspecified: Secondary | ICD-10-CM

## 2017-10-04 DIAGNOSIS — M6281 Muscle weakness (generalized): Secondary | ICD-10-CM

## 2017-10-04 DIAGNOSIS — G8929 Other chronic pain: Secondary | ICD-10-CM | POA: Diagnosis not present

## 2017-10-04 MED ORDER — METOPROLOL SUCCINATE ER 50 MG PO TB24: ORAL_TABLET | ORAL | 3 refills | Status: DC

## 2017-10-04 NOTE — Telephone Encounter (Signed)
Refill sent to the pharmacy electronically.  

## 2017-10-04 NOTE — Therapy (Signed)
[facility] [address] [place], [place], [address] Phone: [phone]   Fax:  [phone]  Physical Therapy Treatment  Patient Details  Name: Glen Bolton MRN: [id number] Date of Birth: 1941/03/05 Referring Provider: Lujean Amel, MD   Encounter Date: 10/04/2017  PT End of Session - 10/04/17 0928    Visit Number  7    Date for PT Re-Evaluation  11/02/17    Authorization Type  Medicare    PT Start Time  0845    PT Stop Time  0926    PT Time Calculation (min)  41 min    Activity Tolerance  Patient tolerated treatment well;No increased pain    Behavior During Therapy  WFL for tasks assessed/performed       Past Medical History:  Diagnosis Date  . Arthritis   . Atrial fibrillation ([place])   . Dysrhythmia   . GERD (gastroesophageal reflux disease)   . Glaucoma   . Hard of hearing   . History of gastritis   . History of kidney stones   . History of stomach ulcers   . Hx of migraines    no migraines since he was in his 79's  . Hypertension   . Sleep apnea    CPAP    Past Surgical History:  Procedure Laterality Date  . CATARACT EXTRACTION Bilateral   . COLONOSCOPY    . ESOPHAGOGASTRODUODENOSCOPY    . INGUINAL HERNIA REPAIR Left    x 2  . Rotator cuff surgery Right 2013  . TOTAL HIP ARTHROPLASTY Left 10/27/2015   Procedure: TOTAL HIP ARTHROPLASTY ANTERIOR APPROACH;  Surgeon: Frederik Pear, MD;  Location: [facility];  Service: Orthopedics;  Laterality: Left;    There were no vitals filed for this visit.  Subjective Assessment - 10/04/17 0848    Subjective  Pt reports that his back is nearly full fully resolved at this time, about 80% improved overall. He also feels that his dizziness has improved significantly as well since completing the maneuvers from his last session. He feels that his neck is his primary issue at this time, with restricted movement and occasional shooting pain into the head.     Currently  in Pain?  No/denies         [facility] PT Assessment - 10/04/17 0001      AROM   AROM Assessment Site  Cervical    Cervical Flexion  35    Cervical Extension  20    Cervical - Right Side Bend  10    Cervical - Left Side Bend  20    Cervical - Right Rotation  50    Cervical - Left Rotation  30                  [facility] Adult PT Treatment/Exercise - 10/04/17 0001      Lumbar Exercises: Standing   Other Standing Lumbar Exercises  lifts each direction at power tower x20 reps with 20#, x10 reps each with added rotation component     Other Standing Lumbar Exercises  tandem hold with each LE forward 2x30 sec each       Lumbar Exercises: Seated   Other Seated Lumbar Exercises  cervical rotation stretch with towel x10 reps each direction       Lumbar Exercises: Supine   Other Supine Lumbar Exercises  neck retraction x15 reps      Lumbar Exercises: Sidelying   Other Sidelying Lumbar Exercises  thoracic rotation Lt/Rt  x15 reps each             PT Education - 10/04/17 0926    Education provided  Yes    Education Details  importance of improving cervical posture and ROM to get the full benefit of vestibular treatment options, etc.; anatomy of the neck and impact forward head has on the musculature supporting it.     Person(s) Educated  Patient    Methods  Explanation;Handout    Comprehension  Verbalized understanding;Returned demonstration       PT Short Term Goals - 09/20/17 0910      PT SHORT TERM GOAL #1   Title  be independent in initial HEP    Time  4    Period  Weeks    Status  Achieved      PT SHORT TERM GOAL #2   Title  report a 25% reduction in LBP with sitting and standing at work    Baseline  atleast 25% improved    Time  4    Period  Weeks    Status  Achieved      PT SHORT TERM GOAL #3   Title  perform 5x sit to stand in < or = to 13 seconds to improve balance    Time  4    Period  Weeks    Status  On-going        PT Long Term Goals -  10/04/17 1353      PT LONG TERM GOAL #1   Title  be independent in advanced HEP    Time  8    Period  Weeks    Status  On-going      PT LONG TERM GOAL #2   Title  perform single leg stance on Rt and Lt for 6-8 seconds to improve balance and safety    Time  8    Period  Weeks    Status  On-going      PT LONG TERM GOAL #3   Title  report a 60% reduction in LBP with standing and walking    Baseline  80% improved     Time  8    Period  Weeks    Status  Achieved      PT LONG TERM GOAL #4   Title  improve LE strength to stand and walk for 10-15 minutes without fatigue    Time  8    Period  Weeks    Status  Achieved      PT LONG TERM GOAL #5   Title  perform sit to standing in < or = to 12 seconds to improve balance    Time  8    Period  Weeks    Status  On-going      PT LONG TERM GOAL #6   Title  report a 50% reduction in LBP with standing and walking     Time  8    Period  Weeks    Status  Achieved      PT LONG TERM GOAL #7   Title  demonstrate 4 to 4+/5 LE strength to improve endurance and strength    Time  8    Period  Weeks    Status  On-going            Plan - 10/04/17 1007    Clinical Impression Statement  Pt reports nearly 80% resolution in his low back pain since the start of therapy. His balance  and vertigo is somewhat improved since adjustments were made to his treatment technique at home. Pt does demonstrate significant limitations in cervical ROM contributing to both the compensations he is making in the low back with daily activity as well as impacting the effectiveness of his vestibular exercises for management of vertigo at home. Pt is interested in vestibular rehab and we will consider transfer to neuro rehab for this.    Rehab Potential  Good    PT Frequency  2x / week    PT Duration  8 weeks    PT Treatment/Interventions  ADLs/Self Care Home Management;Cryotherapy;Electrical Stimulation;Ultrasound;Traction;Moist Heat;Gait training;Stair  training;Functional mobility training;Therapeutic activities;Therapeutic exercise;Balance training;Patient/family education;Neuromuscular re-education;Manual techniques;Passive range of motion;Taping;Dry needling    PT Next Visit Plan  f/u with possible transfer to vestibular clinic and update goals for cervical ROM; balance exercise; cervical/thoracic ROM    PT Home Exercise Plan  low trunk rotations; thoracic rotation; rows with green TB; chin tuck and thoracic rotation    Consulted and Agree with Plan of Care  Patient       Patient will benefit from skilled therapeutic intervention in order to improve the following deficits and impairments:  Pain, Impaired flexibility, Difficulty walking, Decreased safety awareness, Decreased strength, Decreased activity tolerance, Decreased endurance, Decreased range of motion, Postural dysfunction, Increased muscle spasms, Improper body mechanics, Abnormal gait  Visit Diagnosis: Muscle weakness (generalized)  Difficulty in walking, not elsewhere classified  Chronic bilateral low back pain without sciatica     Problem List Patient Active Problem List   Diagnosis Date Noted  . Primary osteoarthritis of left hip 10/25/2015  . Atrial fibrillation ([place]) 08/26/2015   2:03 PM,10/04/17 Sherol Dade PT, DPT [facility] [address], [place], [place], [address] Phone: [phone]   Fax:  [phone]  Name: Floyed Masoud MRN: [id number] Date of Birth: [birth year]

## 2017-10-04 NOTE — Telephone Encounter (Signed)
Pt called needing a refill on METOPROLOL  50 mg   Pharm:  [facility]   [address] [place]   [phone]

## 2017-10-04 NOTE — Patient Instructions (Signed)
   Supine Cervical Retraction into Towel Roll  Patient tucks their chin and pulls their neck back into the towel.  x15 reps, hold 3 sec.       Sidelying Thoracic Rotation  Lying on side, hips and knees at 90 degrees. Rotate hand and head backwards until stretch is felt in mid back area.  x15 reps each.    [facility] [address] [place], [place] [address] Phone # [phone] Fax [phone]

## 2017-10-05 ENCOUNTER — Other Ambulatory Visit: Payer: Self-pay

## 2017-10-05 MED ORDER — METOPROLOL SUCCINATE ER 50 MG PO TB24: ORAL_TABLET | ORAL | 3 refills | Status: DC

## 2017-10-06 ENCOUNTER — Ambulatory Visit: Payer: Medicare Other | Admitting: Physical Therapy

## 2017-10-06 DIAGNOSIS — M6281 Muscle weakness (generalized): Secondary | ICD-10-CM

## 2017-10-06 DIAGNOSIS — M545 Low back pain, unspecified: Secondary | ICD-10-CM

## 2017-10-06 DIAGNOSIS — R262 Difficulty in walking, not elsewhere classified: Secondary | ICD-10-CM

## 2017-10-06 DIAGNOSIS — G8929 Other chronic pain: Secondary | ICD-10-CM | POA: Diagnosis not present

## 2017-10-06 NOTE — Patient Instructions (Addendum)
   LOWER TRUNK ROTATIONS - LTR  Lying on your back with your knees bent, gently move your knees side-to-side.  x15 reps each direction     Sidelying Thoracic Rotation  Lying on side, hips and knees at 90 degrees. Rotate hand and head backwards until stretch is felt in mid back area. x15 reps each direction.      SINGLE KNEE TO CHEST STRETCH - SKTC  While lying on your back, use your hands and gently draw up a knee towards your chest.   Keep your other knee straight and lying on the ground.  Hold 10 sec, repeat 5x each side.      SEATED HAMSTRING STRETCH  While seated, rest your heel on the floor with your knee straight and gently lean forward until a stretch is felt behind your knee/thigh.  Hold 30 sec, repeat 2x each side.    [facility] [address] [place], [place] [address] Phone # [phone] Fax [phone]

## 2017-10-06 NOTE — Therapy (Signed)
[facility] [address], [place], [place], [address] Phone: [phone]   Fax:  [phone]  Physical Therapy Treatment/Discharge  Patient Details  Name: Glen Bolton MRN: [id number] Date of Birth: [birth year] Referring Provider: Lujean Amel, MD    Encounter Date: 10/06/2017  PT End of Session - 10/06/17 0921    Visit Number  8    Date for PT Re-Evaluation  11/02/17    Authorization Type  Medicare    PT Start Time  0850    PT Stop Time  0930    PT Time Calculation (min)  40 min    Activity Tolerance  Patient tolerated treatment well;No increased pain    Behavior During Therapy  WFL for tasks assessed/performed       Past Medical History:  Diagnosis Date  . Arthritis   . Atrial fibrillation ([place])   . Dysrhythmia   . GERD (gastroesophageal reflux disease)   . Glaucoma   . Hard of hearing   . History of gastritis   . History of kidney stones   . History of stomach ulcers   . Hx of migraines    no migraines since he was in his 79's  . Hypertension   . Sleep apnea    CPAP    Past Surgical History:  Procedure Laterality Date  . CATARACT EXTRACTION Bilateral   . COLONOSCOPY    . ESOPHAGOGASTRODUODENOSCOPY    . INGUINAL HERNIA REPAIR Left    x 2  . Rotator cuff surgery Right 2013  . TOTAL HIP ARTHROPLASTY Left 10/27/2015   Procedure: TOTAL HIP ARTHROPLASTY ANTERIOR APPROACH;  Surgeon: Frederik Pear, MD;  Location: [place] [facility];  Service: Orthopedics;  Laterality: Left;    There were no vitals filed for this visit.  Subjective Assessment - 10/06/17 0854    Subjective  Pt reports that his back continues to not bother him. He is focusing primarily on his neck and balance at the moment.     How long can you stand comfortably?  unlimited for what he needs to do during the day    How long can you walk comfortably?  unlimited for what he needs to do during the day    Currently in Pain?  No/denies         [facility]  PT Assessment - 10/06/17 0001      Assessment   Medical Diagnosis  imbalance, lumbar disc disease, weakness of both lower limbs    Referring Provider  Dibas Koirala, MD     Onset Date/Surgical Date  06/07/17    Next MD Visit  -- neuro MD 10/2017      Precautions   Precautions  Fall      Restrictions   Weight Bearing Restrictions  No      Balance Screen   Has the patient fallen in the past 6 months  No    Has the patient had a decrease in activity level because of a fear of falling?   No    Is the patient reluctant to leave their home because of a fear of falling?   No      Home Environment   Living Environment  Private residence    Living Arrangements  Spouse/significant other    Type of [place] Access  Level entry    [place] - single point      Prior Function  Level of Independence  Independent    Vocation  Part time employment    Scientist, product/process development course- standing/sitting    Leisure  golf      Cognition   Overall Cognitive Status  Within Functional Limits for tasks assessed      Posture/Postural Control   Posture/Postural Control  Postural limitations    Postural Limitations  Forward head;Decreased lumbar lordosis      AROM   Overall AROM   Deficits    Overall AROM Comments  lumbar AROM limited atleast 50% all directions, but pain free      PROM   Overall PROM   --    Overall PROM Comments  --      Strength   Overall Strength  Deficits    Right Hip Flexion  5/5    Right Hip Extension  5/5    Left Hip Flexion  5/5    Left Hip Extension  5/5    Right Knee Flexion  5/5    Right Knee Extension  5/5    Left Knee Flexion  5/5    Left Knee Extension  5/5    Right Ankle Dorsiflexion  5/5    Left Ankle Dorsiflexion  5/5      Flexibility   Soft Tissue Assessment /Muscle Length  yes    Hamstrings  Rt 40 deg lacking, Lt 35 deg lacking (90/90 position)       Palpation   Spinal mobility  reduced PA  mobility in the thoracic and lumbar spine.  No discomfort noted     Palpation comment  --      Transfers   Five time sit to stand comments   10 sec, no UE       Ambulation/Gait   Ambulation/Gait  Yes    Ambulation/Gait Assistance  7: Independent    Gait Pattern  Step-through pattern;Decreased stride length      High Level Balance   High Level Balance Comments  SLS: Lt 4 sec, Rt 5 sec                  [facility] Adult PT Treatment/Exercise - 10/06/17 0001      Lumbar Exercises: Stretches   Passive Hamstring Stretch  2 reps;Left;Right;30 seconds;Limitations    Passive Hamstring Stretch Limitations  seated    Single Knee to Chest Stretch  3 reps;10 seconds;Left;Right      Lumbar Exercises: Supine   Other Supine Lumbar Exercises  low trunk rotation x5 reps each direction     Other Supine Lumbar Exercises  single knee to chest       Lumbar Exercises: Sidelying   Other Sidelying Lumbar Exercises  thoracic rotation Lt/Rt x10 reps each             PT Education - 10/06/17 0927    Education provided  Yes    Education Details  reviewed goals/progress; needing new referral for vestibular evaluation at our neuro outpatient clinic; reviewed advanced HEP and discussed recommendations for pt to follow at local gym if he decides to join    [employer]) Educated  Patient    Methods  Explanation;Handout    Comprehension  Verbalized understanding;Returned demonstration       PT Short Term Goals - 10/06/17 0907      PT SHORT TERM GOAL #1   Title  be independent in initial HEP    Time  4    Period  Weeks    Status  Achieved  PT SHORT TERM GOAL #2   Title  report a 25% reduction in LBP with sitting and standing at work    Baseline  atleast 25% improved    Time  4    Period  Weeks    Status  Achieved      PT SHORT TERM GOAL #3   Title  perform 5x sit to stand in < or = to 13 seconds to improve balance    Time  4    Period  Weeks    Status  Achieved        PT Long  Term Goals - 10/06/17 7782      PT LONG TERM GOAL #1   Title  be independent in advanced HEP    Time  8    Period  Weeks    Status  Achieved      PT LONG TERM GOAL #2   Title  perform single leg stance on Rt and Lt for 6-8 seconds to improve balance and safety    Baseline  SLS up to 4-5 sec each LE    Time  8    Period  Weeks    Status  Not Met      PT LONG TERM GOAL #3   Title  report a 60% reduction in LBP with standing and walking    Baseline  80% improved     Time  8    Period  Weeks    Status  Achieved      PT LONG TERM GOAL #4   Title  improve LE strength to stand and walk for 10-15 minutes without fatigue    Time  8    Period  Weeks    Status  Achieved      PT LONG TERM GOAL #5   Title  perform sit to standing in < or = to 12 seconds to improve balance    Time  8    Period  Weeks    Status  Achieved      PT LONG TERM GOAL #6   Title  report a 50% reduction in LBP with standing and walking     Time  8    Period  Weeks    Status  Achieved      PT LONG TERM GOAL #7   Title  demonstrate 4 to 4+/5 LE strength to improve endurance and strength    Time  8    Period  Weeks    Status  Achieved            Plan - 10/06/17 0933    Clinical Impression Statement  Pt was discharged this visit with fully resolved back pain and reporting atleast 85% improved flexibility since the start of therapy. He does continue to demonstrate limitations in thoracic/lumbar/hip flexibility upon reassessment, however his strength and functional power has significantly improved. He is very pleased with the progress in this and has recently noted increase in unsteadiness which has improved some with education on self-repositioning maneuvers provided in previous sessions. Pt was originally also concerned with his steadiness with walking, and although his single leg balance has improved by 2-3 seconds, he would benefit from further vestibular evaluation to rule out other underlying causes of  instability and poor balance. Pt was agreeable with this and will plan to follow up with referring physician regarding this.     Rehab Potential  Good    PT Frequency  2x / week  PT Duration  8 weeks    PT Treatment/Interventions  ADLs/Self Care Home Management;Cryotherapy;Electrical Stimulation;Ultrasound;Traction;Moist Heat;Gait training;Stair training;Functional mobility training;Therapeutic activities;Therapeutic exercise;Balance training;Patient/family education;Neuromuscular re-education;Manual techniques;Passive range of motion;Taping;Dry needling    PT Next Visit Plan  MD referral for vestibular evaluation at neuro rehab    PT Home Exercise Plan  --    Consulted and Agree with Plan of Care  Patient       Patient will benefit from skilled therapeutic intervention in order to improve the following deficits and impairments:  Pain, Impaired flexibility, Difficulty walking, Decreased safety awareness, Decreased strength, Decreased activity tolerance, Decreased endurance, Decreased range of motion, Postural dysfunction, Increased muscle spasms, Improper body mechanics, Abnormal gait  Visit Diagnosis: Muscle weakness (generalized)  Difficulty in walking, not elsewhere classified  Chronic bilateral low back pain without sciatica     Problem List Patient Active Problem List   Diagnosis Date Noted  . Primary osteoarthritis of left hip 10/25/2015  . Atrial fibrillation ([place]) 08/26/2015    PHYSICAL THERAPY DISCHARGE SUMMARY  Visits from Start of Care: 8  Current functional level related to goals / functional outcomes: See above for more details    Remaining deficits: See above for more details    Education / Equipment: See above for more details  Plan: Patient agrees to discharge.  Patient goals were partially met. Patient is being discharged due to being pleased with the current functional level.  ?????      10:14 AM,10/06/17 Sherol Dade PT, DPT [place]  [facility] [address], [place], [place], [address] Phone: [phone]   Fax:  [phone]  Name: Donovyn Guidice MRN: [id number] Date of Birth: April 05, 1941

## 2017-10-11 ENCOUNTER — Ambulatory Visit: Payer: Medicare Other | Admitting: Neurology

## 2017-11-02 ENCOUNTER — Ambulatory Visit: Payer: Medicare Other | Attending: Family Medicine | Admitting: Physical Therapy

## 2017-11-02 ENCOUNTER — Encounter: Payer: Self-pay | Admitting: Physical Therapy

## 2017-11-02 DIAGNOSIS — R2681 Unsteadiness on feet: Secondary | ICD-10-CM | POA: Diagnosis not present

## 2017-11-02 DIAGNOSIS — H8111 Benign paroxysmal vertigo, right ear: Secondary | ICD-10-CM | POA: Diagnosis not present

## 2017-11-02 NOTE — Therapy (Addendum)
[facility] [address] [place], [place], [address] Phone: [phone]   Fax:  [phone]  Physical Therapy Evaluation  Patient Details  Name: Glen Bolton MRN: [id number] Date of Birth: Jun 16, 1941 Referring Provider: Lujean Amel, MD   Encounter Date: 11/02/2017  PT End of Session - 11/02/17 0830    Visit Number  1    Number of Visits  6    Date for PT Re-Evaluation  12/14/17    Authorization Type  Medicare    PT Start Time  0803    PT Stop Time  0827    PT Time Calculation (min)  24 min    Activity Tolerance  Patient tolerated treatment well    Behavior During Therapy  [facility] for tasks assessed/performed       Past Medical History:  Diagnosis Date  . Arthritis   . Atrial fibrillation ([place])   . Dysrhythmia   . GERD (gastroesophageal reflux disease)   . Glaucoma   . Hard of hearing   . History of gastritis   . History of kidney stones   . History of stomach ulcers   . Hx of migraines    no migraines since he was in his 39's  . Hypertension   . Sleep apnea    CPAP    Past Surgical History:  Procedure Laterality Date  . CATARACT EXTRACTION Bilateral   . COLONOSCOPY    . ESOPHAGOGASTRODUODENOSCOPY    . INGUINAL HERNIA REPAIR Left    x 2  . Rotator cuff surgery Right 2013  . TOTAL HIP ARTHROPLASTY Left 10/27/2015   Procedure: TOTAL HIP ARTHROPLASTY ANTERIOR APPROACH;  Surgeon: Frederik Pear, MD;  Location: [facility];  Service: Orthopedics;  Laterality: Left;    There were no vitals filed for this visit.   Subjective Assessment - 11/02/17 0805    Subjective  Pt is a 77 y/o male who presents to OPPT for balance issues x "several months."  Pt reports hx of BPPV which he self txs with home maneuvers but having difficulty getting otoconia to clear.  Pt reports symptoms worse on Rt side, and also has difficulty with supine to sit and sit to stand.    Pertinent History  Lt THA 11/2015    Patient Stated Goals   improve balance    Currently in Pain?  No/denies         [facility] PT Assessment - 11/02/17 0807      Assessment   Medical Diagnosis  vertigo    Referring Provider  Koirala, Dibas, MD    Onset Date/Surgical Date  -- 7-8 months ago    Prior Therapy  just d/c'ed from [facility] clinic for LBP      Precautions   Precautions  Fall      Restrictions   Weight Bearing Restrictions  No      Balance Screen   Has the patient fallen in the past 6 months  No    Has the patient had a decrease in activity level because of a fear of falling?   No    Is the patient reluctant to leave their home because of a fear of falling?   No      Home Environment   Living Environment  Private residence    Living Arrangements  Spouse/significant other    Type of [place] Access  Level entry    [place] - single point      Prior Function   Level of Independence  Independent    Vocation  Part time employment    Scientist, product/process development course- standing/sitting    Leisure  golf      Cognition   Overall Cognitive Status  Within Functional Limits for tasks assessed      Observation/Other Assessments   Focus on Therapeutic Outcomes (FOTO)   69 (31% limited; predicted 23% limited)      Ambulation/Gait   Gait Comments  amb WNL         Vestibular Assessment - 11/02/17 0809      Vestibular Assessment   General Observation  no symptoms at rest      Symptom Behavior   Type of Dizziness  Imbalance    Frequency of Dizziness  "everytime I get up"    Duration of Dizziness  seconds    Aggravating Factors  Supine to sit;Sit to stand    Relieving Factors  Head stationary;Rest;Slow movements      Occulomotor Exam   Occulomotor Alignment  Normal    Spontaneous  Absent    Gaze-induced  Absent    Smooth Pursuits  Intact    Saccades  Intact      Vestibulo-Occular Reflex   VOR 1 Head Only (x 1 viewing)  WNL    Comment  HIT (-) bil      Positional  Testing   Sidelying Test  Sidelying Right;Sidelying Left    Horizontal Canal Testing  Horizontal Canal Right;Horizontal Canal Left      Sidelying Right   Sidelying Right Duration  13 sec    Sidelying Right Symptoms  Upbeat, right rotatory nystagmus      Sidelying Left   Sidelying Left Duration  none    Sidelying Left Symptoms  No nystagmus      Horizontal Canal Right   Horizontal Canal Right Duration  none    Horizontal Canal Right Symptoms  Normal      Horizontal Canal Left   Horizontal Canal Left Duration  none    Horizontal Canal Left Symptoms  Normal         Objective measurements completed on examination: See above findings.       Vestibular Treatment/Exercise - 11/02/17 0819      Vestibular Treatment/Exercise   Vestibular Treatment Provided  Canalith Repositioning    Canalith Repositioning  Epley Manuever Right       EPLEY MANUEVER RIGHT   Number of Reps   2    Overall Response  Symptoms Resolved            PT Education - 11/02/17 0830    Education provided  Yes    Education Details  stop vestibular exercises for now    Person(s) Educated  Patient    Methods  Explanation    Comprehension  Verbalized understanding       PT Short Term Goals - 11/02/17 0833      PT SHORT TERM GOAL #1   Title  formal balance testing to follow with LTGs to be written    Status  New    Target Date  11/16/17      PT SHORT TERM GOAL #2   Title  n/a      PT SHORT TERM GOAL #3   Title  n/a        PT Long Term Goals - 11/02/17 1610      PT LONG TERM GOAL #  1   Title  demonstrate negative positional testing    Status  New    Target Date  12/14/17      PT LONG TERM GOAL #2   Title  independent with HEP if indicated    Status  New    Target Date  12/14/17      PT LONG TERM GOAL #3   Title  balance goals to follow      PT LONG TERM GOAL #4   Title  n/a      PT LONG TERM GOAL #5   Title  n/a      PT LONG TERM GOAL #6   Title  n/a      PT LONG TERM  GOAL #7   Title  n/a             Plan - 11/02/17 0831    Clinical Impression Statement  Pt is a 77 y/o male who presents to OPPT for vertigo.  Clinical findings (+) for Rt pBPPV which resolved after 1 rep of Epley's.  Pt will benefit from PT to address dizziness, and any lingering imbalance.  Pt continues to report imbalance, but known orthostatic hypotension and is currently working with MD to manage symptoms and medication.  Will plan to further address imbalance PRN.    Rehab Potential  Good    PT Frequency  1x / week    PT Duration  6 weeks    PT Treatment/Interventions  ADLs/Self Care Home Management;Cryotherapy;Moist Heat;Gait training;Stair training;Functional mobility training;Therapeutic activities;Therapeutic exercise;Balance training;Patient/family education;Neuromuscular re-education;Manual techniques;Canalith Repostioning;Vestibular    PT Next Visit Plan  reassess for BPPV, tx PRN; balance testing    Consulted and Agree with Plan of Care  Patient       Patient will benefit from skilled therapeutic intervention in order to improve the following deficits and impairments:  Decreased balance, Dizziness  Visit Diagnosis: BPPV (benign paroxysmal positional vertigo), right - Plan: PT plan of care cert/re-cert  Unsteadiness on feet - Plan: PT plan of care cert/re-cert     Problem List Patient Active Problem List   Diagnosis Date Noted  . Primary osteoarthritis of left hip 10/25/2015  . Atrial fibrillation ([place]) 08/26/2015      Laureen Abrahams, PT, DPT 11/02/17 8:42 AM    [facility] [address] [place], [place], [address] Phone: [phone]   Fax:  [phone]  Name: Glen Bolton MRN: [id number] Date of Birth: [birth year]

## 2017-11-07 ENCOUNTER — Ambulatory Visit: Payer: Medicare Other | Attending: Family Medicine | Admitting: Physical Therapy

## 2017-11-07 ENCOUNTER — Encounter: Payer: Self-pay | Admitting: Physical Therapy

## 2017-11-07 DIAGNOSIS — H8111 Benign paroxysmal vertigo, right ear: Secondary | ICD-10-CM

## 2017-11-07 DIAGNOSIS — R2681 Unsteadiness on feet: Secondary | ICD-10-CM | POA: Insufficient documentation

## 2017-11-07 NOTE — Therapy (Signed)
[facility] [address] [place], [place], [address] Phone: [phone]   Fax:  [phone]  Physical Therapy Treatment  Patient Details  Name: Glen Bolton MRN: [id number] Date of Birth: [birth year] Referring Provider: Lujean Amel, MD   Encounter Date: 11/07/2017  PT End of Session - 11/07/17 0937    Visit Number  2    Number of Visits  6    Date for PT Re-Evaluation  12/14/17    Authorization Type  Medicare    PT Start Time  0850    PT Stop Time  0930    PT Time Calculation (min)  40 min    Activity Tolerance  Patient tolerated treatment well    Behavior During Therapy  [facility] for tasks assessed/performed       Past Medical History:  Diagnosis Date  . Arthritis   . Atrial fibrillation ([place])   . Dysrhythmia   . GERD (gastroesophageal reflux disease)   . Glaucoma   . Hard of hearing   . History of gastritis   . History of kidney stones   . History of stomach ulcers   . Hx of migraines    no migraines since he was in his 19's  . Hypertension   . Sleep apnea    CPAP    Past Surgical History:  Procedure Laterality Date  . CATARACT EXTRACTION Bilateral   . COLONOSCOPY    . ESOPHAGOGASTRODUODENOSCOPY    . INGUINAL HERNIA REPAIR Left    x 2  . Rotator cuff surgery Right 2013  . TOTAL HIP ARTHROPLASTY Left 10/27/2015   Procedure: TOTAL HIP ARTHROPLASTY ANTERIOR APPROACH;  Surgeon: Frederik Pear, MD;  Location: [facility];  Service: Orthopedics;  Laterality: Left;    There were no vitals filed for this visit.  Subjective Assessment - 11/07/17 0851    Subjective  Rt eye "will feel like there's something in it." When this happens, Rt nose will run as well; has followed up with multiple docs and "no one seems to be concerned."    Pertinent History  Lt THA 11/2015    Patient Stated Goals  improve balance    Currently in Pain?  No/denies         [facility] PT Assessment - 11/07/17 0920      Standardized Balance  Assessment   Standardized Balance Assessment  Dynamic Gait Index      Dynamic Gait Index   Level Surface  Mild Impairment    Change in Gait Speed  Mild Impairment    Gait with Horizontal Head Turns  Moderate Impairment    Gait with Vertical Head Turns  Mild Impairment    Gait and Pivot Turn  Normal    Step Over Obstacle  Normal    Step Around Obstacles  Normal    Steps  Mild Impairment    Total Score  18         Vestibular Assessment - 11/07/17 0853      Vestibular Assessment   General Observation  feels nauseous and off balance; but no more spinning      Symptom Behavior   Type of Dizziness  Imbalance      Positional Testing   Sidelying Test  Sidelying Right;Sidelying Left    Horizontal Canal Testing  Horizontal Canal Right;Horizontal Canal Left      Sidelying Right   Sidelying Right Duration  none    Sidelying Right Symptoms  No nystagmus      Sidelying  Left   Sidelying Left Duration  none    Sidelying Left Symptoms  No nystagmus      Horizontal Canal Right   Horizontal Canal Right Duration  none    Horizontal Canal Right Symptoms  Normal      Horizontal Canal Left   Horizontal Canal Left Duration  none    Horizontal Canal Left Symptoms  Normal      Orthostatics   BP supine (x 5 minutes)  164/94    HR supine (x 5 minutes)  75    BP standing (after 1 minute)  132/76    HR standing (after 1 minute)  74    BP standing (after 3 minutes)  155/83    HR standing (after 3 minutes)  67              [facility] Adult PT Treatment/Exercise - 11/07/17 0932      Self-Care   Self-Care  Other Self-Care Comments    Other Self-Care Comments   orthostatic hypotension, how vestibular system affects balance and exercises to address deficits          Balance Exercises - 11/07/17 0919      Balance Exercises: Standing   Standing Eyes Closed  Narrow base of support (BOS);Foam/compliant surface;Head turns        PT Education - 11/07/17 0936    Education provided   Yes    Education Details  HEP, see self care    Person(s) Educated  Patient    Methods  Explanation    Comprehension  Verbalized understanding       PT Short Term Goals - 11/02/17 0833      PT SHORT TERM GOAL #1   Title  formal balance testing to follow with LTGs to be written    Status  New    Target Date  11/16/17      PT SHORT TERM GOAL #2   Title  n/a      PT SHORT TERM GOAL #3   Title  n/a        PT Long Term Goals - 11/07/17 0937      PT LONG TERM GOAL #1   Title  demonstrate negative positional testing    Status  Achieved      PT LONG TERM GOAL #2   Title  independent with HEP if indicated    Status  On-going    Target Date  12/14/17      PT LONG TERM GOAL #3   Title  balance goals to follow    Status  Achieved      PT LONG TERM GOAL #4   Title  improve DGI to >/= 21/24 for improved balance and decreased fall risk    Status  New    Target Date  12/14/17            Plan - 11/07/17 0947    Clinical Impression Statement  Pt demonstrated resovle of BPPV today but continues to have (+) orthostatic hypotension and decreased vestibular input with balance activities.  Pt will continue to benefit from PT to maximize function.     PT Treatment/Interventions  ADLs/Self Care Home Management;Cryotherapy;Moist Heat;Gait training;Stair training;Functional mobility training;Therapeutic activities;Therapeutic exercise;Balance training;Patient/family education;Neuromuscular re-education;Manual techniques;Canalith Repostioning;Vestibular    PT Next Visit Plan  reassess for BPPV, tx PRN; balance testing    Consulted and Agree with Plan of Care  Patient       Patient will benefit from skilled therapeutic intervention in  order to improve the following deficits and impairments:  Decreased balance, Dizziness  Visit Diagnosis: BPPV (benign paroxysmal positional vertigo), right  Unsteadiness on feet     Problem List Patient Active Problem List   Diagnosis Date  Noted  . Primary osteoarthritis of left hip 10/25/2015  . Atrial fibrillation ([place]) 08/26/2015      Laureen Abrahams, PT, DPT 11/07/17 9:49 AM    [facility] [address] [place], [place], [address] Phone: [phone]   Fax:  [phone]  Name: Irven Ingalsbe MRN: [id number] Date of Birth: 08/27/1941

## 2017-11-07 NOTE — Patient Instructions (Signed)
Feet Together (Compliant Surface) Head Motion - Eyes Closed    Stand on compliant surface: __pillow or cushion___ with feet together. Close eyes and move head slowly, up and down 10 times each way; and sided to side 10 times each way. Repeat _1-2__ times per session. Do __1-2__ sessions per day.   Carpeted Surface With Side to Side Head Motion    Perform without assistive device. Walking on carpet (or whatever surface is along counter or hallway), turn head and eyes left for _2___ steps. Then, turn head and eyes straight ahead for __2__ steps. Then, turn head and eyes to opposite side for __2__ steps.  Perform for length of hallway. Repeat sequence __1-2__ times per session. Do _1-2___ sessions per day.   Carpeted Surface With Up / Down Head Motion    Perform without assistive device. Walking on carpet, move head and eyes toward ceiling for __2__ steps. Then, move head and eyes straight ahead for __2__ steps. Then, move head and eyes toward floor for __2__ steps.  Perform for length of hallway. Repeat sequence __1-2__ times per session. Do __1-2__ sessions per day.

## 2017-11-08 DIAGNOSIS — H01022 Squamous blepharitis right lower eyelid: Secondary | ICD-10-CM | POA: Diagnosis not present

## 2017-11-08 DIAGNOSIS — H01025 Squamous blepharitis left lower eyelid: Secondary | ICD-10-CM | POA: Diagnosis not present

## 2017-11-08 DIAGNOSIS — H401133 Primary open-angle glaucoma, bilateral, severe stage: Secondary | ICD-10-CM | POA: Diagnosis not present

## 2017-11-10 ENCOUNTER — Other Ambulatory Visit: Payer: Self-pay | Admitting: Family Medicine

## 2017-11-10 DIAGNOSIS — M542 Cervicalgia: Secondary | ICD-10-CM | POA: Diagnosis not present

## 2017-11-10 DIAGNOSIS — R7303 Prediabetes: Secondary | ICD-10-CM | POA: Diagnosis not present

## 2017-11-10 DIAGNOSIS — R42 Dizziness and giddiness: Secondary | ICD-10-CM

## 2017-11-10 DIAGNOSIS — R2689 Other abnormalities of gait and mobility: Secondary | ICD-10-CM

## 2017-11-10 DIAGNOSIS — R0982 Postnasal drip: Secondary | ICD-10-CM | POA: Diagnosis not present

## 2017-11-10 DIAGNOSIS — Z79899 Other long term (current) drug therapy: Secondary | ICD-10-CM | POA: Diagnosis not present

## 2017-11-14 ENCOUNTER — Encounter: Payer: Self-pay | Admitting: Physical Therapy

## 2017-11-14 ENCOUNTER — Ambulatory Visit: Payer: Medicare Other | Admitting: Physical Therapy

## 2017-11-14 DIAGNOSIS — R2681 Unsteadiness on feet: Secondary | ICD-10-CM | POA: Diagnosis not present

## 2017-11-14 DIAGNOSIS — H8111 Benign paroxysmal vertigo, right ear: Secondary | ICD-10-CM | POA: Diagnosis not present

## 2017-11-14 NOTE — Therapy (Addendum)
[facility] [address] [place], [place], [address] Phone: [phone]   Fax:  [phone]  Physical Therapy Treatment/Discharge  Patient Details  Name: Glen Bolton MRN: [id number] Date of Birth: [birth year] Referring Provider: Lujean Amel, MD   Encounter Date: 11/14/2017  PT End of Session - 11/14/17 0842    Visit Number  3    Number of Visits  6    Date for PT Re-Evaluation  12/14/17    Authorization Type  Medicare    PT Start Time  0803    PT Stop Time  0841    PT Time Calculation (min)  38 min    Activity Tolerance  Patient tolerated treatment well    Behavior During Therapy  [facility] for tasks assessed/performed       Past Medical History:  Diagnosis Date  . Arthritis   . Atrial fibrillation ([employer])   . Dysrhythmia   . GERD (gastroesophageal reflux disease)   . Glaucoma   . Hard of hearing   . History of gastritis   . History of kidney stones   . History of stomach ulcers   . Hx of migraines    no migraines since he was in his 35's  . Hypertension   . Sleep apnea    CPAP    Past Surgical History:  Procedure Laterality Date  . CATARACT EXTRACTION Bilateral   . COLONOSCOPY    . ESOPHAGOGASTRODUODENOSCOPY    . INGUINAL HERNIA REPAIR Left    x 2  . Rotator cuff surgery Right 2013  . TOTAL HIP ARTHROPLASTY Left 10/27/2015   Procedure: TOTAL HIP ARTHROPLASTY ANTERIOR APPROACH;  Surgeon: Frederik Pear, MD;  Location: [place];  Service: Orthopedics;  Laterality: Left;    There were no vitals filed for this visit.  Subjective Assessment - 11/14/17 0806    Subjective  feeling better overall; yesterday was "a bad day" but "today is pretty good."  Has MRI of head/neck next Monday. spinning is resolved; mobility seems to be most difficult    Patient Stated Goals  improve balance    Currently in Pain?  No/denies         [facility] PT Assessment - 11/14/17 0841      Observation/Other Assessments   Focus on  Therapeutic Outcomes (FOTO)   77 (23% limited)                       Balance Exercises - 11/14/17 0817      Balance Exercises: Standing   Standing Eyes Closed  Narrow base of support (BOS);Foam/compliant surface;Head turns    Rockerboard  Anterior/posterior;Lateral;Head turns    Balance Beam  forward taps/kicks without UE support x 10 alternating    Gait with Head Turns  Forward;2 reps 40'x 2 horizontal/vertical    Other Standing Exercises  sit to stand x 10 reps without UE support on compliant surface        PT Education - 11/14/17 0842    Education provided  Yes    Education Details  check BP and glucose when symptomatic as symptoms now seem unrelated to vestibular dysfunction    Person(s) Educated  Patient    Methods  Explanation    Comprehension  Verbalized understanding       PT Short Term Goals - 11/02/17 7915      PT SHORT TERM GOAL #1   Title  formal balance testing to follow with LTGs to be written  Status  New    Target Date  11/16/17      PT SHORT TERM GOAL #2   Title  n/a      PT SHORT TERM GOAL #3   Title  n/a        PT Long Term Goals - 11/07/17 0937      PT LONG TERM GOAL #1   Title  demonstrate negative positional testing    Status  Achieved      PT LONG TERM GOAL #2   Title  independent with HEP if indicated    Status  On-going    Target Date  12/14/17      PT LONG TERM GOAL #3   Title  balance goals to follow    Status  Achieved      PT LONG TERM GOAL #4   Title  improve DGI to >/= 21/24 for improved balance and decreased fall risk    Status  New    Target Date  12/14/17            Plan - 11/14/17 0842    Clinical Impression Statement  Pt tolerated session well today and symptoms now inconsistent and concerned they may be related to BP and/or glucose.  Advised to check both if he's feeling symptomatic.  No LOB with vestibular activities today.    PT Treatment/Interventions  ADLs/Self Care Home  Management;Cryotherapy;Moist Heat;Gait training;Stair training;Functional mobility training;Therapeutic activities;Therapeutic exercise;Balance training;Patient/family education;Neuromuscular re-education;Manual techniques;Canalith Repostioning;Vestibular    PT Next Visit Plan  continue PRN, balance and dynamic gait    Consulted and Agree with Plan of Care  Patient       Patient will benefit from skilled therapeutic intervention in order to improve the following deficits and impairments:  Decreased balance, Dizziness  Visit Diagnosis: BPPV (benign paroxysmal positional vertigo), right  Unsteadiness on feet     Problem List Patient Active Problem List   Diagnosis Date Noted  . Primary osteoarthritis of left hip 10/25/2015  . Atrial fibrillation ([place]) 08/26/2015      Laureen Abrahams, PT, DPT 11/14/17 8:44 AM    [facility] [address] [place], [place], [address] Phone: [phone]   Fax:  [phone]  Name: Glen Bolton MRN: [id number] Date of Birth: [birth year]      PHYSICAL THERAPY DISCHARGE SUMMARY  Visits from Start of Care: 3  Current functional level related to goals / functional outcomes: See above   Remaining deficits: See above; held PT as vertigo resolved, but continued to have symptoms that seems unrelated to vestibular dysfunction   Education / Equipment: HEP  Plan: Patient agrees to discharge.  Patient goals were partially met. Patient is being discharged due to                                                    vertigo symptoms resolved; held chart open in case vertigo returned. ?????      Laureen Abrahams, PT, DPT 12/15/17 4:03 PM   [facility] [address] [place], [place] [address]  [phone] (office) [phone] (fax)

## 2017-11-15 DIAGNOSIS — M1711 Unilateral primary osteoarthritis, right knee: Secondary | ICD-10-CM | POA: Diagnosis not present

## 2017-11-15 DIAGNOSIS — M1712 Unilateral primary osteoarthritis, left knee: Secondary | ICD-10-CM | POA: Diagnosis not present

## 2017-11-20 ENCOUNTER — Other Ambulatory Visit: Payer: Self-pay | Admitting: Cardiology

## 2017-11-21 ENCOUNTER — Ambulatory Visit
Admission: RE | Admit: 2017-11-21 | Discharge: 2017-11-21 | Disposition: A | Discharge status: Home / Self Care | Payer: Medicare Other | Source: Ambulatory Visit | Attending: Family Medicine | Admitting: Family Medicine

## 2017-11-21 DIAGNOSIS — R2689 Other abnormalities of gait and mobility: Secondary | ICD-10-CM

## 2017-11-21 DIAGNOSIS — R42 Dizziness and giddiness: Secondary | ICD-10-CM

## 2017-11-23 ENCOUNTER — Ambulatory Visit: Payer: Medicare Other | Admitting: Physical Therapy

## 2017-11-24 ENCOUNTER — Other Ambulatory Visit: Payer: Self-pay | Admitting: Family Medicine

## 2017-11-24 DIAGNOSIS — R2689 Other abnormalities of gait and mobility: Secondary | ICD-10-CM

## 2017-11-24 DIAGNOSIS — M542 Cervicalgia: Secondary | ICD-10-CM

## 2017-11-28 ENCOUNTER — Ambulatory Visit: Payer: Medicare Other

## 2017-11-29 DIAGNOSIS — R350 Frequency of micturition: Secondary | ICD-10-CM | POA: Diagnosis not present

## 2017-11-29 DIAGNOSIS — W19XXXA Unspecified fall, initial encounter: Secondary | ICD-10-CM | POA: Diagnosis not present

## 2017-11-29 DIAGNOSIS — M545 Low back pain: Secondary | ICD-10-CM | POA: Diagnosis not present

## 2017-11-30 ENCOUNTER — Ambulatory Visit
Admission: RE | Admit: 2017-11-30 | Discharge: 2017-11-30 | Disposition: A | Discharge status: Home / Self Care | Payer: Medicare Other | Source: Ambulatory Visit | Attending: Family Medicine | Admitting: Family Medicine

## 2017-11-30 ENCOUNTER — Ambulatory Visit (INDEPENDENT_AMBULATORY_CARE_PROVIDER_SITE_OTHER): Payer: Medicare Other | Admitting: Neurology

## 2017-11-30 ENCOUNTER — Telehealth: Payer: Self-pay | Admitting: Neurology

## 2017-11-30 ENCOUNTER — Encounter: Payer: Self-pay | Admitting: Neurology

## 2017-11-30 ENCOUNTER — Other Ambulatory Visit: Payer: Self-pay | Admitting: Family Medicine

## 2017-11-30 ENCOUNTER — Other Ambulatory Visit: Payer: Self-pay | Admitting: *Deleted

## 2017-11-30 VITALS — BP 139/73 | HR 73 | Ht 70.0 in | Wt 260.5 lb

## 2017-11-30 DIAGNOSIS — M545 Low back pain: Secondary | ICD-10-CM | POA: Diagnosis not present

## 2017-11-30 DIAGNOSIS — M542 Cervicalgia: Secondary | ICD-10-CM

## 2017-11-30 DIAGNOSIS — W19XXXA Unspecified fall, initial encounter: Secondary | ICD-10-CM

## 2017-11-30 DIAGNOSIS — S3992XA Unspecified injury of lower back, initial encounter: Secondary | ICD-10-CM | POA: Diagnosis not present

## 2017-11-30 DIAGNOSIS — R269 Unspecified abnormalities of gait and mobility: Secondary | ICD-10-CM

## 2017-11-30 NOTE — Progress Notes (Signed)
Reason for visit: Gait disorder  Referring physician: Dr. Carla Drape Glen Bolton is a 77 y.o. male  History of present illness:  Glen Bolton is a 77 year old right-handed white male with a history of obesity and borderline diabetes.  Glen Bolton reports a long-standing history of positional vertigo, he has had episodes of vertigo off and on for many years.  He had another event in September 2018 and had onset of some problems with balance since that time.  Glen Bolton has required vestibular therapy to improve Glen vertigo as Glen vertigo did not respond to his own treatments at home.  Glen Bolton still has some lightheaded sensations, Glen vertigo has gone.  He has reported some difficulty with balance associated with this, he indicates that he may go forward if he bends over, he may veer off to one side or Glen other when he tries to walk.  He has recently undergone MRI of Glen brain that did not show any significant ischemic changes, Glen Bolton does have a history of atrial fibrillation.  Glen Bolton also has hypertension, he denies any recent changes in medications around Glen time of onset of symptoms.  Glen Bolton does report some tingling sensations in Glen feet on both sides, he may feel that Glen feet are cold when they are not.  He notices this more when he is inactive.  Glen Bolton also has a prior history of migraine headaches, he may get intermittent pain in Glen right eye that is associated with photophobia, he may have a runny nose on Glen right side during Glen headache pain that may last several hours.  These episodes occur intermittently and are not directly associated with episodes of vertigo.  He reports no true weakness of Glen extremities, when he walks longer distances he may have achy sensations in Glen legs, he does have some low back pain, particularly after a recent fall.  Glen Bolton has had a total hip replacement on Glen left.  Glen Bolton does report some neck pain and some neck stiffness,  he will occasionally have shooting pains up Glen back of Glen head on Glen left.  MRI of Glen cervical spine was ordered recently and was apparently never done.  A recent B12 level was normal.  Glen Bolton is sent to this office for an evaluation.  Glen Bolton has undergone physical therapy for balance training as well with minimal benefit.  Past Medical History:  Diagnosis Date  . Arthritis   . Atrial fibrillation ([place])   . Dysrhythmia   . GERD (gastroesophageal reflux disease)   . Glaucoma   . Hard of hearing   . History of gastritis   . History of kidney stones   . History of stomach ulcers   . Hx of migraines    no migraines since he was in his 52's  . Hypertension   . Sleep apnea    CPAP    Past Surgical History:  Procedure Laterality Date  . CATARACT EXTRACTION Bilateral   . COLONOSCOPY    . ESOPHAGOGASTRODUODENOSCOPY    . INGUINAL HERNIA REPAIR Left    x 2  . Rotator cuff surgery Right 2013  . TOTAL HIP ARTHROPLASTY Left 10/27/2015   Procedure: TOTAL HIP ARTHROPLASTY ANTERIOR APPROACH;  Surgeon: Frederik Pear, MD;  Location: [place];  Service: Orthopedics;  Laterality: Left;    Family History  Problem Relation Age of Onset  . Hyperlipidemia Brother   . Stroke Sister 47    Social  history:  reports that he has never smoked. He has never used smokeless tobacco. He reports that he drinks about 0.6 oz of alcohol per week. He reports that he does not use drugs.  Medications:  Prior to Admission medications   Medication Sig Start Date End Date Taking? Authorizing Provider  amLODipine (NORVASC) 10 MG tablet Take 5 mg by mouth daily. 11/24/15   [provider]  bimatoprost (LUMIGAN) 0.01 % SOLN Place 1 drop into both eyes at bedtime.     [provider]  dorzolamide (TRUSOPT) 2 % ophthalmic solution Place 1 drop into both eyes 2 (two) times daily.     [provider]  hydrochlorothiazide (HYDRODIURIL) 25 MG tablet Take 1 tablet by mouth daily. 11/02/16    [provider]  meloxicam (MOBIC) 15 MG tablet Take 1 tablet (15 mg total) by mouth daily. Bolton not taking: Reported on 11/02/2017 06/18/16   Gardiner Barefoot, DPM  methocarbamol (ROBAXIN) 500 MG tablet TAKE 1 TABLET BY MOUTH EVERY 6 HOURS AS NEEDED FOR SPASMS 07/21/15   [provider]  metoprolol succinate (TOPROL-XL) 50 MG 24 hr tablet take 1 tablet by mouth once daily TAKE WITH OR IMMEDIATELY FOLLOWING A MEAL 10/05/17   Troy Sine, MD  Multiple Vitamins-Minerals (MULTIVITAMIN & MINERAL PO) Take 1 tablet by mouth daily.    [provider]  olmesartan (BENICAR) 20 MG tablet TK 1 T PO D 05/23/17   [provider]  Probiotic Product (PROBIOTIC PO) Take 1 tablet by mouth daily.     [provider]  XARELTO 20 MG TABS tablet TAKE 1 TABLET BY MOUTH ONCE DAILY WITH SUPPER 11/21/17   Minus Breeding, MD     No Known Allergies  ROS:  Out of a complete 14 system review of symptoms, Glen Bolton complains only of Glen following symptoms, and all other reviewed systems are negative.  Eye pain Gait disorder  Blood pressure 139/73, pulse 73, height 5\' 10"  (1.778 m), weight 260 lb 8 oz (118.2 kg).   Blood pressure, right arm, sitting is 427 systolic, blood pressure, right arm, standing is 062 systolic.  Physical Exam  General: Glen Bolton is alert and cooperative at Glen time of Glen examination.  Glen Bolton is markedly obese.  Eyes: Pupils are equal, round, and reactive to light. Discs are flat bilaterally.  Neck: Glen neck is supple, no carotid bruits are noted.  Respiratory: Glen respiratory examination is clear.  Cardiovascular: Glen cardiovascular examination reveals a regular rate and rhythm, no obvious murmurs or rubs are noted.  Skin: Extremities are with slight edema below Glen knees bilaterally, varicose veins are noted on Glen left lower extremity.  Neurologic Exam  Mental status: Glen Bolton is alert and oriented x 3 at Glen time of Glen  examination. Glen Bolton has apparent normal recent and remote memory, with an apparently normal attention span and concentration ability.  Cranial nerves: Facial symmetry is present. There is good sensation of Glen face to pinprick and soft touch bilaterally. Glen strength of Glen facial muscles and Glen muscles to head turning and shoulder shrug are normal bilaterally. Speech is well enunciated, no aphasia or dysarthria is noted. Extraocular movements are full. Visual fields are full. Glen tongue is midline, and Glen Bolton has symmetric elevation of Glen soft palate. No obvious hearing deficits are noted.  Motor: Glen motor testing reveals 5 over 5 strength of all 4 extremities. Good symmetric motor tone is noted throughout.  Sensory: Sensory testing is intact  to pinprick, soft touch, vibration sensation, and position sense on all 4 extremities. No evidence of extinction is noted.  Coordination: Cerebellar testing reveals good finger-nose-finger and heel-to-shin bilaterally.  Gait and station: Gait is normal. Tandem gait is slightly unsteady. Romberg is negative. No drift is seen.  Reflexes: Deep tendon reflexes are symmetric, but are depressed bilaterally. Toes are downgoing bilaterally.   MRI brain 11/21/17:  IMPRESSION: 1. A left mastoid effusion has developed since Glen 2017 brain MRI and may be a consequence of left eustachian tube dysfunction. Recommend ENT follow-up. 2. Noncontrast MRI appearance of Glen brain remains normal for age. 3. Chronic paranasal sinus disease appears stable since 2017.  * MRI scan images were reviewed online. I agree with Glen written report.    Assessment/Plan:  1.  Mild gait disturbance  2.  History of positional vertigo  3.  History of migraine headache  Glen Bolton does have a history of positional vertigo, his walking problem started around Glen time of onset of vertigo.  Glen Bolton likely has chronic vestibular dysfunction that is leading to his  balance issues.  Glen Bolton also reports some numbness in Glen feet, deep tendon reflexes are diffusely decreased.  Glen Bolton will be set up for nerve conduction studies of both legs and EMG of one leg.  Glen Bolton will be sent for MRI of Glen cervical spine that was not done previously.  He will follow-up through this office in 4 or 5 months.  Glen balance issues appear to be fairly mild at this point.  Jill Alexanders MD 11/30/2017 8:47 AM  [facility] [address] [place],  [address]  Phone [phone] Fax [phone]

## 2017-11-30 NOTE — Telephone Encounter (Signed)
Medicare order sent to GI they will contact her to schedule.

## 2017-11-30 NOTE — Patient Instructions (Signed)
We will get MRI of the neck and get EMG and NCV evaluation to look at nerve function of the legs.

## 2017-12-05 DIAGNOSIS — J329 Chronic sinusitis, unspecified: Secondary | ICD-10-CM | POA: Diagnosis not present

## 2017-12-09 ENCOUNTER — Ambulatory Visit
Admission: RE | Admit: 2017-12-09 | Discharge: 2017-12-09 | Disposition: A | Discharge status: Home / Self Care | Payer: Medicare Other | Source: Ambulatory Visit | Attending: Neurology | Admitting: Neurology

## 2017-12-09 DIAGNOSIS — M542 Cervicalgia: Secondary | ICD-10-CM | POA: Diagnosis not present

## 2017-12-09 DIAGNOSIS — R269 Unspecified abnormalities of gait and mobility: Secondary | ICD-10-CM

## 2017-12-11 ENCOUNTER — Telehealth: Payer: Self-pay | Admitting: Neurology

## 2017-12-11 NOTE — Telephone Encounter (Signed)
I called the patient.  The MRI of the cervical spine only shows mild degenerative changes, nothing that should impact balance or cause significant dizziness.  I discussed this with the patient.    MRI cervical 12/11/17:  IMPRESSION: This MRI of the cervical spine without contrast shows the following: 1.   There is mild spinal stenosis at C3-C4, C4-C5, C5-C6 and C6-C7 due to combinations of disc bulges or protrusions and spondylosis.  There is various degrees of foraminal narrowing as detailed above but no definite nerve root compression. 2.   The spinal cord has normal signal.

## 2017-12-26 ENCOUNTER — Ambulatory Visit (INDEPENDENT_AMBULATORY_CARE_PROVIDER_SITE_OTHER): Payer: Medicare Other | Admitting: Neurology

## 2017-12-26 ENCOUNTER — Encounter: Payer: Self-pay | Admitting: Neurology

## 2017-12-26 DIAGNOSIS — E538 Deficiency of other specified B group vitamins: Secondary | ICD-10-CM

## 2017-12-26 DIAGNOSIS — G603 Idiopathic progressive neuropathy: Secondary | ICD-10-CM

## 2017-12-26 DIAGNOSIS — R269 Unspecified abnormalities of gait and mobility: Secondary | ICD-10-CM

## 2017-12-26 DIAGNOSIS — G629 Polyneuropathy, unspecified: Secondary | ICD-10-CM

## 2017-12-26 HISTORY — DX: Polyneuropathy, unspecified: G62.9

## 2017-12-26 NOTE — Procedures (Signed)
     HISTORY:  Glen Bolton is a 77 year old gentleman with a 5-year history of numbness and cold sensations of the feet.  The patient has borderline diabetes.  He also reports some troubles with a sensation of imbalance with walking, dizziness.  He is being evaluated for this issue.  NERVE CONDUCTION STUDIES:  Nerve conduction studies were performed on both lower extremities.  The distal motor latencies for the peroneal and posterior tibial nerves were within normal limits bilaterally.  Low motor amplitudes were seen for these nerves with exception that the amplitudes for the left posterior tibial nerve were normal.  The nerve conduction velocities for the peroneal and posterior tibial nerves were slowed bilaterally.  The sensory latencies for the sural and peroneal nerves were unobtainable bilaterally.  The F-wave latencies for the posterior tibial nerves were prolonged bilaterally.  EMG STUDIES:  EMG study was performed on the left lower extremity:  The tibialis anterior muscle reveals 2 to 5K motor units with decreased recruitment. No fibrillations or positive waves were seen. The peroneus tertius muscle reveals no motor units with no recruitment. No fibrillations or positive waves were seen. The medial gastrocnemius muscle reveals 1 to 4K motor units with decreased recruitment. No fibrillations or positive waves were seen. The vastus lateralis muscle reveals 2 to 4K motor units with full recruitment. No fibrillations or positive waves were seen. The iliopsoas muscle reveals 2 to 4K motor units with full recruitment. No fibrillations or positive waves were seen. The biceps femoris muscle (long head) reveals 2 to 4K motor units with full recruitment. No fibrillations or positive waves were seen. The lumbosacral paraspinal muscles were tested at 3 levels, and revealed no abnormalities of insertional activity at all 3 levels tested. There was good relaxation.   IMPRESSION:  Nerve  conduction studies done on both lower extremities shows evidence of a primarily axonal peripheral neuropathy of moderate severity.  EMG evaluation of the right lower extremity shows chronic stable distal signs of denervation consistent with the diagnosis of peripheral neuropathy.  No evidence of an overlying lumbosacral radiculopathy was seen.  Jill Alexanders MD 12/26/2017 9:28 AM  [facility] [address] [place], [place] [address]  Phone [phone] Fax [phone]

## 2017-12-26 NOTE — Progress Notes (Addendum)
The patient comes in for EMG nerve conduction study today.  Nerve conduction study and EMG evaluation are consistent with a primarily axonal peripheral neuropathy of moderate severity.  Blood work will be done today looking for treatable causes of neuropathy.  The patient does have borderline diabetes.  He has had symptoms in the feet for about 5 years.    [place]    Nerve / Sites Muscle Latency Ref. Amplitude Ref. Rel Amp Segments Distance Velocity Ref. Area    ms ms mV mV %  cm m/s m/s mVms  R Peroneal - EDB     Ankle EDB 5.8 ?6.5 0.2 ?2.0 100 Ankle - EDB 9   0.8     Fib head EDB 13.6  0.2  87.6 Fib head - Ankle 32 41 ?44 0.7     Pop fossa EDB 16.6  0.2  118 Pop fossa - Fib head 12 41 ?44 0.7         Pop fossa - Ankle      L Peroneal - EDB     Ankle EDB 6.5 ?6.5 0.9 ?2.0 100 Ankle - EDB 9   2.1     Fib head EDB 14.6  0.8  92.3 Fib head - Ankle 32 39 ?44 1.7     Pop fossa EDB 17.1  0.7  90.4 Pop fossa - Fib head 10 40 ?44 1.4         Pop fossa - Ankle      R Tibial - AH     Ankle AH 4.7 ?5.8 3.3 ?4.0 100 Ankle - AH 9   7.8     Pop fossa AH 15.1  1.8  53.7 Pop fossa - Ankle 39 38 ?41 5.4  L Tibial - AH     Ankle AH 4.8 ?5.8 6.9 ?4.0 100 Ankle - AH 9   17.2     Pop fossa AH 15.0  4.1  58.7 Pop fossa - Ankle 39 38 ?41 14.4             SNC    Nerve / Sites Rec. Site Peak Lat Ref.  Amp Ref. Segments Distance    ms ms V V  cm  R Sural - Ankle (Calf)     Calf Ankle NR ?4.4 NR ?6 Calf - Ankle 14  L Sural - Ankle (Calf)     Calf Ankle NR ?4.4 NR ?6 Calf - Ankle 14  R Superficial peroneal - Ankle     Lat leg Ankle NR ?4.4 NR ?6 Lat leg - Ankle 14  L Superficial peroneal - Ankle     Lat leg Ankle NR ?4.4 NR ?6 Lat leg - Ankle 14              F  Wave    Nerve F Lat Ref.   ms ms  R Tibial - AH 58.3 ?56.0  L Tibial - AH 59.1 ?56.0         EMG full

## 2017-12-26 NOTE — Progress Notes (Signed)
Please refer to EMG and nerve conduction study procedure note. 

## 2017-12-28 ENCOUNTER — Telehealth: Payer: Self-pay | Admitting: Neurology

## 2017-12-28 DIAGNOSIS — D472 Monoclonal gammopathy: Secondary | ICD-10-CM

## 2017-12-28 LAB — MULTIPLE MYELOMA PANEL, SERUM
ALPHA2 GLOB SERPL ELPH-MCNC: 0.8 g/dL (ref 0.4–1.0)
Albumin SerPl Elph-Mcnc: 3.9 g/dL (ref 2.9–4.4)
Albumin/Glob SerPl: 1.4 (ref 0.7–1.7)
Alpha 1: 0.2 g/dL (ref 0.0–0.4)
B-Globulin SerPl Elph-Mcnc: 0.9 g/dL (ref 0.7–1.3)
Gamma Glob SerPl Elph-Mcnc: 0.9 g/dL (ref 0.4–1.8)
Globulin, Total: 2.8 g/dL (ref 2.2–3.9)
IGM (IMMUNOGLOBULIN M), SRM: 255 mg/dL — AB (ref 15–143)
IgA/Immunoglobulin A, Serum: 146 mg/dL (ref 61–437)
IgG (Immunoglobin G), Serum: 770 mg/dL (ref 700–1600)
TOTAL PROTEIN: 6.7 g/dL (ref 6.0–8.5)

## 2017-12-28 LAB — ANGIOTENSIN CONVERTING ENZYME: ANGIO CONVERT ENZYME: 25 U/L (ref 14–82)

## 2017-12-28 LAB — ANA W/REFLEX: ANA: NEGATIVE

## 2017-12-28 LAB — B. BURGDORFI ANTIBODIES: Lyme IgG/IgM Ab: <0.91 {ISR} (ref 0.00–0.90)

## 2017-12-28 LAB — VITAMIN B12: Vitamin B-12: 758 pg/mL (ref 232–1245)

## 2017-12-28 NOTE — Telephone Encounter (Signed)
I called the patient.  The blood work is unremarkable with exception that the patient does have a monoclonal antibody with IgM antibody, lambda chain.  I will get a referral for an opinion from hematology/oncology whether or not this is a benign issue or not.  I suppose this could be an etiology for his peripheral neuropathy although the neuropathy itself does not appear to be demyelinating in nature.

## 2018-01-04 ENCOUNTER — Encounter: Payer: Self-pay | Admitting: Hematology and Oncology

## 2018-01-04 ENCOUNTER — Telehealth: Payer: Self-pay | Admitting: Hematology and Oncology

## 2018-01-04 NOTE — Telephone Encounter (Signed)
Appt has been scheduled for the pt to see Dr. Lebron Conners on 5/21 at 8am. Pt needed an early morning appt because of his job. Letter mailed.

## 2018-01-24 ENCOUNTER — Telehealth: Payer: Self-pay

## 2018-01-24 ENCOUNTER — Inpatient Hospital Stay: Payer: Medicare Other

## 2018-01-24 ENCOUNTER — Inpatient Hospital Stay: Payer: Medicare Other | Attending: Hematology and Oncology | Admitting: Hematology and Oncology

## 2018-01-24 ENCOUNTER — Encounter: Payer: Self-pay | Admitting: Hematology and Oncology

## 2018-01-24 VITALS — BP 159/99 | HR 75 | Temp 98.6°F | Resp 18 | Ht 70.0 in | Wt 259.8 lb

## 2018-01-24 DIAGNOSIS — D472 Monoclonal gammopathy: Secondary | ICD-10-CM

## 2018-01-24 DIAGNOSIS — Z8711 Personal history of peptic ulcer disease: Secondary | ICD-10-CM | POA: Diagnosis not present

## 2018-01-24 DIAGNOSIS — M1990 Unspecified osteoarthritis, unspecified site: Secondary | ICD-10-CM | POA: Diagnosis not present

## 2018-01-24 DIAGNOSIS — I8393 Asymptomatic varicose veins of bilateral lower extremities: Secondary | ICD-10-CM | POA: Diagnosis not present

## 2018-01-24 DIAGNOSIS — G629 Polyneuropathy, unspecified: Secondary | ICD-10-CM | POA: Insufficient documentation

## 2018-01-24 DIAGNOSIS — Z7901 Long term (current) use of anticoagulants: Secondary | ICD-10-CM

## 2018-01-24 DIAGNOSIS — I4891 Unspecified atrial fibrillation: Secondary | ICD-10-CM | POA: Insufficient documentation

## 2018-01-24 DIAGNOSIS — G4730 Sleep apnea, unspecified: Secondary | ICD-10-CM | POA: Diagnosis not present

## 2018-01-24 DIAGNOSIS — Z87442 Personal history of urinary calculi: Secondary | ICD-10-CM

## 2018-01-24 DIAGNOSIS — I10 Essential (primary) hypertension: Secondary | ICD-10-CM | POA: Diagnosis not present

## 2018-01-24 DIAGNOSIS — K219 Gastro-esophageal reflux disease without esophagitis: Secondary | ICD-10-CM | POA: Diagnosis not present

## 2018-01-24 DIAGNOSIS — G603 Idiopathic progressive neuropathy: Secondary | ICD-10-CM

## 2018-01-24 DIAGNOSIS — Z79899 Other long term (current) drug therapy: Secondary | ICD-10-CM

## 2018-01-24 LAB — CBC WITH DIFFERENTIAL (CANCER CENTER ONLY)
Basophils Absolute: 0 10*3/uL (ref 0.0–0.1)
Basophils Relative: 0 %
Eosinophils Absolute: 0.2 10*3/uL (ref 0.0–0.5)
Eosinophils Relative: 2 %
HEMATOCRIT: 45.4 % (ref 38.4–49.9)
HEMOGLOBIN: 15.1 g/dL (ref 13.0–17.1)
LYMPHS ABS: 0.7 10*3/uL — AB (ref 0.9–3.3)
LYMPHS PCT: 8 %
MCH: 31.1 pg (ref 27.2–33.4)
MCHC: 33.3 g/dL (ref 32.0–36.0)
MCV: 93.6 fL (ref 79.3–98.0)
MONOS PCT: 15 %
Monocytes Absolute: 1.3 10*3/uL — ABNORMAL HIGH (ref 0.1–0.9)
NEUTROS PCT: 75 %
Neutro Abs: 6.4 10*3/uL (ref 1.5–6.5)
Platelet Count: 141 10*3/uL (ref 140–400)
RBC: 4.85 MIL/uL (ref 4.20–5.82)
RDW: 14 % (ref 11.0–14.6)
WBC Count: 8.5 10*3/uL (ref 4.0–10.3)

## 2018-01-24 LAB — CMP (CANCER CENTER ONLY)
ALBUMIN: 4 g/dL (ref 3.5–5.0)
ALT: 31 U/L (ref 0–55)
AST: 31 U/L (ref 5–34)
Alkaline Phosphatase: 81 U/L (ref 40–150)
Anion gap: 7 (ref 3–11)
BUN: 17 mg/dL (ref 7–26)
CHLORIDE: 106 mmol/L (ref 98–109)
CO2: 26 mmol/L (ref 22–29)
Calcium: 9.1 mg/dL (ref 8.4–10.4)
Creatinine: 0.85 mg/dL (ref 0.70–1.30)
GFR, Estimated: >60 mL/min (ref >60)
Glucose, Bld: 109 mg/dL (ref 70–140)
Potassium: 3.9 mmol/L (ref 3.5–5.1)
SODIUM: 139 mmol/L (ref 136–145)
Total Bilirubin: 0.7 mg/dL (ref 0.2–1.2)
Total Protein: 7 g/dL (ref 6.4–8.3)

## 2018-01-24 LAB — URIC ACID: URIC ACID, SERUM: 5.6 mg/dL (ref 2.6–7.4)

## 2018-01-24 LAB — LACTATE DEHYDROGENASE: LDH: 255 U/L — ABNORMAL HIGH (ref 125–245)

## 2018-01-24 NOTE — Telephone Encounter (Signed)
Printed avs and calender of upcoming appointment.  Per 5/21 los 

## 2018-01-25 ENCOUNTER — Telehealth: Payer: Self-pay | Admitting: Cardiology

## 2018-01-25 DIAGNOSIS — D472 Monoclonal gammopathy: Secondary | ICD-10-CM | POA: Insufficient documentation

## 2018-01-25 LAB — KAPPA/LAMBDA LIGHT CHAINS
Kappa free light chain: 193.1 mg/L — ABNORMAL HIGH (ref 3.3–19.4)
Kappa, lambda light chain ratio: 13.99 — ABNORMAL HIGH (ref 0.26–1.65)
Lambda free light chains: 13.8 mg/L (ref 5.7–26.3)

## 2018-01-25 LAB — BETA 2 MICROGLOBULIN, SERUM: Beta-2 Microglobulin: 1.3 mg/L (ref 0.6–2.4)

## 2018-01-25 LAB — HEPATITIS B CORE ANTIBODY, TOTAL: HEP B C TOTAL AB: NEGATIVE

## 2018-01-25 LAB — HEPATITIS B SURFACE ANTIGEN: Hepatitis B Surface Ag: NEGATIVE

## 2018-01-25 LAB — VISCOSITY, SERUM: VISCOSITY, SERUM: 1.6 rel.saline (ref 1.6–1.9)

## 2018-01-25 LAB — HCV COMMENT:

## 2018-01-25 LAB — HEPATITIS B SURFACE ANTIBODY,QUALITATIVE: Hep B S Ab: NONREACTIVE

## 2018-01-25 LAB — HEPATITIS C ANTIBODY (REFLEX)

## 2018-01-25 NOTE — Progress Notes (Signed)
[facility] New Visit:  Assessment: IgM lambda monoclonal gammopathy 77 y.o. male with progressive neuropathy in the context of monoclonal IgM lambda gammopathy.  Differential includes hematological malignancies such as lymphoplasmacytic lymphoma, IgM multiple myeloma, Waldenstrm's macroglobulinemia.  Nonmalignant etiologies would include autoimmune conditions such as rheumatoid arthritis and lupus and possible infectious etiologies.  Plan: - Labs today as outlined below to confirm presence of monoclonal gammopathy and obtain further definition. - PET/CT. -Consult interventional radiology for bone marrow biopsy. -Return to clinic in 3 weeks to review the findings and discuss potential course of therapy.   Voice recognition software was used and creation of this note. Despite my best effort at editing the text, some misspelling/errors may have occurred. Orders Placed This Encounter  Procedures  . NM PET Image Initial (PI) Whole Body    Standing Status:   Future    Standing Expiration Date:   01/24/2019    Order Specific Question:   If indicated for the ordered procedure, I authorize the administration of a radiopharmaceutical per Radiology protocol    Answer:   Yes    Order Specific Question:   Preferred imaging location?    Answer:   [facility]    Order Specific Question:   Radiology Contrast Protocol - do NOT remove file path    Answer:   \\charchive\epicdata\Radiant\[email or web address].pdf    Order Specific Question:   Reason for Exam additional comments    Answer:   Please eval for evidence of lymphoma  . CT BIOPSY    Order Specific Question:   Lab orders requested (DO NOT place separate lab orders, these will be automatically ordered during procedure specimen collection):    Answer:   Other    Comments:   Pathology, flow cytometry, cytogenetics, B-cell FISH & MYD-88    Order Specific Question:   Reason for Exam (SYMPTOM  OR DIAGNOSIS REQUIRED)    Answer:    IgM monoclonal gammopathy, please eval for LPL    Order Specific Question:   Preferred imaging location?    Answer:   [facility]    Order Specific Question:   Radiology Contrast Protocol - do NOT remove file path    Answer:   \\charchive\epicdata\Radiant\CTProtocols.pdf  . CT BONE MARROW BIOPSY & ASPIRATION    Standing Status:   Future    Standing Expiration Date:   04/27/2019    Order Specific Question:   Reason for Exam (SYMPTOM  OR DIAGNOSIS REQUIRED)    Answer:   IgM monoclonal gammopathy, please eval for LPL    Order Specific Question:   Preferred imaging location?    Answer:   [facility]    Order Specific Question:   Radiology Contrast Protocol - do NOT remove file path    Answer:   \\charchive\epicdata\Radiant\CTProtocols.pdf  . CBC with Differential ([place] Only)    Standing Status:   Future    Number of Occurrences:   1    Standing Expiration Date:   01/25/2019  . CMP ([place] only)    Standing Status:   Future    Number of Occurrences:   1    Standing Expiration Date:   01/25/2019  . Uric acid    Standing Status:   Future    Number of Occurrences:   1    Standing Expiration Date:   01/24/2019  . Lactate dehydrogenase (LDH)    Standing Status:   Future    Number of Occurrences:   1  Standing Expiration Date:   01/24/2019  . Beta 2 microglobulin, serum  . Multiple Myeloma Panel (SPEP&IFE w/QIG)    Standing Status:   Future    Number of Occurrences:   1    Standing Expiration Date:   01/24/2019  . Kappa/lambda light chains    Standing Status:   Future    Number of Occurrences:   1    Standing Expiration Date:   01/24/2019  . Viscosity, serum    Standing Status:   Future    Number of Occurrences:   1    Standing Expiration Date:   01/24/2019  . 24-Hr Ur UPEP/UIFE/Light Chains/TP    Standing Status:   Future    Number of Occurrences:   1    Standing Expiration Date:   01/24/2019  . Hepatitis B surface antibody    Standing Status:   Future     Number of Occurrences:   1    Standing Expiration Date:   01/24/2019  . Hepatitis B surface antigen    Standing Status:   Future    Number of Occurrences:   1    Standing Expiration Date:   01/24/2019  . Hepatitis B core antibody, total    Standing Status:   Future    Number of Occurrences:   1    Standing Expiration Date:   01/24/2019  . Hepatitis C antibody (reflex if positive)    Standing Status:   Future    Number of Occurrences:   1    Standing Expiration Date:   01/24/2019    All questions were answered.  . The patient knows to call the clinic with any problems, questions or concerns.  This note was electronically signed.    History of Presenting Illness Torrey Ballinas 77 y.o. presenting to the [facility] for monoclonal IgM lambda gammopathy in the setting of progressive bilateral lower extremity neuropathy, referred by Dr Everlean Cherry patient's past medical history significant for hypertension,s.  Osteoarthritis, varicose veins in bilateral lower extremities, atrial fibrillation on therapeutic anticoagulation and history of neuropathy.  Patient's neuropathy has been present for about 5 years.  It has progressed over this time and currently patient suffered significant imbalance and dizziness associated with difficulty ambulating due to significantly diminished sensation in bilateral lower extremities.  EMG obtained previously demonstrated primary axonal neuropathy.  Patient has noticed a decreased appetite for the few months as well as unintentional weight loss of 6 pounds over the past month.  He denies interval fevers, chills, night sweats.  Denies lymphadenopathy in the neck, armpits, or groin.  No chest pain, shortness of breath, or cough.  No nausea, vomiting, abdominal pain, early satiety, diarrhea, or constipation.  Patient denies any new musculoskeletal pain.  No neuropathy in the upper extremities.  Oncological/hematological History: --Labs, 12/26/17: SPEP -- no  M-spike, SIFE --monoclonal IgM lambda; IgG 770, IgA 146, IgM 255; Vit B12 758  Medical History: Past Medical History:  Diagnosis Date  . Arthritis   . Atrial fibrillation ([place])   . Dysrhythmia   . GERD (gastroesophageal reflux disease)   . Glaucoma   . Hard of hearing   . History of gastritis   . History of kidney stones   . History of stomach ulcers   . Hx of migraines    no migraines since he was in his 24's  . Hypertension   . Peripheral neuropathy 12/26/2017  . Sleep apnea    CPAP    Surgical History:  Past Surgical History:  Procedure Laterality Date  . CATARACT EXTRACTION Bilateral   . COLONOSCOPY    . ESOPHAGOGASTRODUODENOSCOPY    . INGUINAL HERNIA REPAIR Left    x 2  . Rotator cuff surgery Right 2013  . TOTAL HIP ARTHROPLASTY Left 10/27/2015   Procedure: TOTAL HIP ARTHROPLASTY ANTERIOR APPROACH;  Surgeon: Frederik Pear, MD;  Location: [facility];  Service: Orthopedics;  Laterality: Left;    Family History: Family History  Problem Relation Age of Onset  . Hyperlipidemia Brother   . Stroke Sister 31    Social History: Social History   Socioeconomic History  . Marital status: Married    Spouse name: Not on file  . Number of children: 2  . Years of education: Not on file  . Highest education level: Not on file  Occupational History  . Occupation: Golf [employer]  Social Needs  . Financial resource strain: Not on file  . Food insecurity:    Worry: Not on file    Inability: Not on file  . Transportation needs:    Medical: Not on file    Non-medical: Not on file  Tobacco Use  . Smoking status: Never Smoker  . Smokeless tobacco: Never Used  Substance and Sexual Activity  . Alcohol use: Yes    Alcohol/week: 0.6 oz    Types: 1 Glasses of wine per week    Comment: daily  . Drug use: No  . Sexual activity: Not on file  Lifestyle  . Physical activity:    Days per week: Not on file    Minutes per session: Not on file  . Stress: Not on file  Relationships  .  Social connections:    Talks on phone: Not on file    Gets together: Not on file    Attends religious service: Not on file    Active member of club or organization: Not on file    Attends meetings of clubs or organizations: Not on file    Relationship status: Not on file  . Intimate partner violence:    Fear of current or ex partner: Not on file    Emotionally abused: Not on file    Physically abused: Not on file    Forced sexual activity: Not on file  Other Topics Concern  . Not on file  Social History Narrative   Lives with wife   Caffeine use: 2 cups coffee daily   [employer]   Right handed     Allergies: No Known Allergies  Medications:  Current Outpatient Medications  Medication Sig Dispense Refill  . bimatoprost (LUMIGAN) 0.01 % SOLN Place 1 drop into both eyes at bedtime.     . metoprolol succinate (TOPROL-XL) 50 MG 24 hr tablet take 1 tablet by mouth once daily TAKE WITH OR IMMEDIATELY FOLLOWING A MEAL 90 tablet 3  . Multiple Vitamins-Minerals (MULTIVITAMIN & MINERAL PO) Take 1 tablet by mouth daily.    Marland Kitchen olmesartan (BENICAR) 20 MG tablet TK 1 T PO D  4  . Probiotic Product (PROBIOTIC PO) Take 1 tablet by mouth daily.     Alveda Reasons 20 MG TABS tablet TAKE 1 TABLET BY MOUTH ONCE DAILY WITH SUPPER 30 tablet 6   No current facility-administered medications for this visit.     Review of Systems: Review of Systems  Constitutional: Positive for appetite change and unexpected weight change.  Musculoskeletal: Positive for gait problem. Negative for back pain.  Neurological: Positive for dizziness and gait problem.  All other systems reviewed and are negative.    PHYSICAL EXAMINATION Blood pressure (!) 159/99, pulse 75, temperature 98.6 F (37 C), temperature source Oral, resp. rate 18, height _0  (1.778 m), weight 259 lb 12.8 oz (117.8 kg), SpO2 96 %.  ECOG PERFORMANCE STATUS: 2 - Symptomatic, <50% confined to bed  Physical Exam  Constitutional: He is oriented to  person, place, and time. He appears well-developed and well-nourished. No distress.  HENT:  Head: Normocephalic and atraumatic.  Mouth/Throat: Oropharynx is clear and moist. No oropharyngeal exudate.  Eyes: Pupils are equal, round, and reactive to light. Conjunctivae and EOM are normal. No scleral icterus.  Neck: No thyromegaly present.  Cardiovascular: Intact distal pulses. Exam reveals no gallop and no friction rub.  No murmur heard. I will give him irregularly irregular heart rate  Pulmonary/Chest: Effort normal and breath sounds normal. No stridor. No respiratory distress. He has no wheezes. He has no rales.  Abdominal: Soft. Bowel sounds are normal. He exhibits no distension and no mass. There is no tenderness. There is no guarding.  Musculoskeletal: He exhibits no edema.  Lymphadenopathy:    He has no cervical adenopathy.  Neurological: He is alert and oriented to person, place, and time. He displays abnormal reflex. A sensory deficit is present. No cranial nerve deficit.  Decreased tactile sensation as well as some diminished tendon reflex in bilateral lower extremities  Skin: He is not diaphoretic.     LABORATORY DATA: I have personally reviewed the data as listed: Clinical Support on 01/24/2018  Component Date Value Ref Range Status  . HCV Ab 01/24/2018 <0.1  0.0 - 0.9 s/co ratio Final   Comment: (NOTE) Performed At: [facility] [place], [place] [id number] Rush Farmer MD [id number] Performed at [facility], [place] [address] [place], [place] [address]   . Hep B Core Total Ab 01/24/2018 Negative  Negative Final   Comment: (NOTE) Performed At: [facility] [place], [place] [id number] Rush Farmer MD [id number] Performed at [facility], [place] [address] [place], [place] [address]   . Hepatitis B Surface Ag 01/24/2018 Negative  Negative Final   Comment:  (NOTE) Performed At: [facility] [place], [place] [id number] Rush Farmer MD [id number] Performed at [facility], [place] [address] [place], [place] [address]   . Hep B S Ab 01/24/2018 Non Reactive   Final   Comment: (NOTE)              Non Reactive: Inconsistent with immunity,                            less than 10 mIU/mL              Reactive:     Consistent with immunity,                            greater than 9.9 mIU/mL Performed At: [facility] [place], [place] [id number] Rush Farmer MD [id number] Performed at [facility], [place] [address] [place], [place] [address]   . Viscosity, Serum 01/24/2018 1.6  1.6 - 1.9 rel.saline Final   Comment: (NOTE) Values above 2.7 may indicate paraproteinemia is present. This test was developed and its performance characteristics determined by [facility]. It has not been cleared or approved by  the Food and Drug Administration. Performed At: [facility] [place], [place] [id number] Rush Farmer MD [id number] Performed at [facility], [place] [address], [place], [place] [address]   . Kappa free light chain 01/24/2018 193.1* 3.3 - 19.4 mg/L Final  . Lamda free light chains 01/24/2018 13.8  5.7 - 26.3 mg/L Final  . Kappa, lamda light chain ratio 01/24/2018 13.99* 0.26 - 1.65 Final   Comment: (NOTE) Performed At: [facility] [place], [place] [id number] Rush Farmer MD [id number] Performed at [facility], [place] [address], [place], [place] [address]   . LDH 01/24/2018 255* 125 - 245 U/L Final   Performed at [facility], [place] [address] [place], [place] [address]  . Uric Acid, Serum 01/24/2018 5.6  2.6 - 7.4 mg/dL Final   Performed at [facility], [place] [address] [place], [place]  [address]  . Sodium 01/24/2018 139  136 - 145 mmol/L Final  . Potassium 01/24/2018 3.9  3.5 - 5.1 mmol/L Final  . Chloride 01/24/2018 106  98 - 109 mmol/L Final  . CO2 01/24/2018 26  22 - 29 mmol/L Final  . Glucose, Bld 01/24/2018 109  70 - 140 mg/dL Final  . BUN 01/24/2018 17  7 - 26 mg/dL Final  . Creatinine 01/24/2018 0.85  0.70 - 1.30 mg/dL Final  . Calcium 01/24/2018 9.1  8.4 - 10.4 mg/dL Final  . Total Protein 01/24/2018 7.0  6.4 - 8.3 g/dL Final  . Albumin 01/24/2018 4.0  3.5 - 5.0 g/dL Final  . AST 01/24/2018 31  5 - 34 U/L Final  . ALT 01/24/2018 31  0 - 55 U/L Final  . Alkaline Phosphatase 01/24/2018 81  40 - 150 U/L Final  . Total Bilirubin 01/24/2018 0.7  0.2 - 1.2 mg/dL Final  . GFR, Est Non Af Am 01/24/2018 >60  >60 mL/min Final  . GFR, Est AFR Am 01/24/2018 >60  >60 mL/min Final   Comment: (NOTE) The eGFR has been calculated using the CKD EPI equation. This calculation has not been validated in all clinical situations. eGFR's persistently <60 mL/min signify possible Chronic Kidney Disease.   Georgiann Hahn gap 01/24/2018 7  3 - 11 Final   Performed at [facility], [place] [address], [place], [place] [address]  . WBC Count 01/24/2018 8.5  4.0 - 10.3 K/uL Final  . RBC 01/24/2018 4.85  4.20 - 5.82 MIL/uL Final  . Hemoglobin 01/24/2018 15.1  13.0 - 17.1 g/dL Final  . HCT 01/24/2018 45.4  38.4 - 49.9 % Final  . MCV 01/24/2018 93.6  79.3 - 98.0 fL Final  . MCH 01/24/2018 31.1  27.2 - 33.4 pg Final  . MCHC 01/24/2018 33.3  32.0 - 36.0 g/dL Final  . RDW 01/24/2018 14.0  11.0 - 14.6 % Final  . Platelet Count 01/24/2018 141  140 - 400 K/uL Final  . Neutrophils Relative % 01/24/2018 75  % Final  . Neutro Abs 01/24/2018 6.4  1.5 - 6.5 K/uL Final  . Lymphocytes Relative 01/24/2018 8  % Final  . Lymphs Abs 01/24/2018 0.7* 0.9 - 3.3 K/uL Final  . Monocytes Relative 01/24/2018 15  % Final  . Monocytes Absolute 01/24/2018 1.3* 0.1 - 0.9 K/uL Final  . Eosinophils  Relative 01/24/2018 2  % Final  . Eosinophils Absolute 01/24/2018 0.2  0.0 - 0.5 K/uL Final  . Basophils Relative 01/24/2018 0  %  Final  . Basophils Absolute 01/24/2018 0.0  0.0 - 0.1 K/uL Final   Performed at [facility], [place] [address], [place], [place] [address]  . Comment: 01/24/2018 Comment   Final   Comment: (NOTE) Non reactive HCV antibody screen is consistent with no HCV infection, unless recent infection is suspected or other evidence exists to indicate HCV infection. Performed At: [facility] [place], [place] [id number] Rush Farmer MD [id number] Performed at [facility], [place] [address] [place], [place] [address]   Office Visit on 01/24/2018  Component Date Value Ref Range Status  . Beta-2 Microglobulin 01/24/2018 1.3  0.6 - 2.4 mg/L Final   Comment: (NOTE) Siemens Immulite 2000 Immunochemiluminometric assay (ICMA) Values obtained with different assay methods or kits cannot be used interchangeably. Results cannot be interpreted as absolute evidence of the presence or absence of malignant disease. Performed At: [facility] [place], [place] [id number] Rush Farmer MD [id number] Performed at [facility], [place] [address] [place], [place] [address]          Ardath Sax, MD

## 2018-01-25 NOTE — Telephone Encounter (Signed)
New Message:          [facility] Pre-operative Risk Assessment    Request for surgical clearance:  1. What type of surgery is being performed? CT Biopsy  2. When is this surgery scheduled? 02-03-18   3. What type of clearance is required (medical clearance vs. Pharmacy clearance to hold med vs. Both)? Pharmacy  4. Are there any medications that need to be held prior to surgery and how long?XARELTO 20 MG TABS tablet the day of the procedure  5. Practice name and name of physician performing surgery?   6. What is your office phone number [phone]   7.   What is your office fax number [phone]  8.   Anesthesia type (None, local, MAC, general) ? N/A   Glen Bolton 01/25/2018, 2:22 PM  _________________________________________________________________   (provider comments below)

## 2018-01-25 NOTE — Assessment & Plan Note (Signed)
77 y.o. male with progressive neuropathy in the context of monoclonal IgM lambda gammopathy.  Differential includes hematological malignancies such as lymphoplasmacytic lymphoma, IgM multiple myeloma, Waldenstrm's macroglobulinemia.  Nonmalignant etiologies would include autoimmune conditions such as rheumatoid arthritis and lupus and possible infectious etiologies.  Plan: - Labs today as outlined below to confirm presence of monoclonal gammopathy and obtain further definition. - PET/CT. -Consult interventional radiology for bone marrow biopsy. -Return to clinic in 3 weeks to review the findings and discuss potential course of therapy.

## 2018-01-26 DIAGNOSIS — I4891 Unspecified atrial fibrillation: Secondary | ICD-10-CM | POA: Diagnosis not present

## 2018-01-26 DIAGNOSIS — I10 Essential (primary) hypertension: Secondary | ICD-10-CM | POA: Diagnosis not present

## 2018-01-26 DIAGNOSIS — I8393 Asymptomatic varicose veins of bilateral lower extremities: Secondary | ICD-10-CM | POA: Diagnosis not present

## 2018-01-26 DIAGNOSIS — G629 Polyneuropathy, unspecified: Secondary | ICD-10-CM | POA: Diagnosis not present

## 2018-01-26 DIAGNOSIS — D472 Monoclonal gammopathy: Secondary | ICD-10-CM | POA: Diagnosis not present

## 2018-01-26 DIAGNOSIS — M1990 Unspecified osteoarthritis, unspecified site: Secondary | ICD-10-CM | POA: Diagnosis not present

## 2018-01-26 NOTE — Telephone Encounter (Signed)
Low risk procedure in this patient who has PAF but no prior history of CAD, once receive recommendation from clinical pharmacist, will reach out to patient, as long as no acute issues, he is cleared.

## 2018-01-27 DIAGNOSIS — J069 Acute upper respiratory infection, unspecified: Secondary | ICD-10-CM | POA: Diagnosis not present

## 2018-01-27 DIAGNOSIS — H6691 Otitis media, unspecified, right ear: Secondary | ICD-10-CM | POA: Diagnosis not present

## 2018-01-27 LAB — UPEP/UIFE/LIGHT CHAINS/TP, 24-HR UR
% BETA, Urine: 0 %
ALBUMIN, U: 100 %
ALPHA 1 URINE: 0 %
Alpha 2, Urine: 0 %
FREE KAPPA/LAMBDA RATIO: 12.25 — AB (ref 2.04–10.37)
Free Kappa Lt Chains,Ur: 93.2 mg/L — ABNORMAL HIGH (ref 1.35–24.19)
Free Lambda Lt Chains,Ur: 7.61 mg/L — ABNORMAL HIGH (ref 0.24–6.66)
GAMMA GLOBULIN URINE: 0 %
TOTAL PROTEIN, URINE-UPE24: 9.2 mg/dL
TOTAL VOLUME: 1700
Total Protein, Urine-Ur/day: 156 mg/24 hr — ABNORMAL HIGH (ref 30–150)

## 2018-01-27 NOTE — Telephone Encounter (Signed)
   Primary Cardiologist: Minus Breeding, MD  Chart reviewed as part of pre-operative protocol coverage. Given past medical history and time since last visit, based on ACC/AHA guidelines, Patsy Varma would be at acceptable risk for the planned procedure without further cardiovascular testing.  He has no chest pain or SOB.  Can meet 4 MET criteria.  In note below Pharmacy recommends holding xarelto for 24 hours prior to procedure.    I will route this recommendation to the requesting party via Epic fax function and remove from pre-op pool.  Please call with questions.  Cecilie Kicks, NP 01/27/2018, 2:08 PM

## 2018-01-27 NOTE — Telephone Encounter (Signed)
Patient with diagnosis of Afib on Xarelto for anticoagulation.    Procedure: CT biopsy Date of procedure: 02/03/18  CHADS2-VASc score of  4 (CHF, HTN, AGE, DM2, stroke/tia x 2, CAD, AGE, male)  CrCl 175ml/min  Per office protocol, patient can hold Xarelto for 24 hours prior to procedure.

## 2018-01-30 LAB — MULTIPLE MYELOMA PANEL, SERUM
ALBUMIN/GLOB SERPL: 1.5 (ref 0.7–1.7)
Albumin SerPl Elph-Mcnc: 3.7 g/dL (ref 2.9–4.4)
Alpha 1: 0.3 g/dL (ref 0.0–0.4)
Alpha2 Glob SerPl Elph-Mcnc: 0.8 g/dL (ref 0.4–1.0)
B-GLOBULIN SERPL ELPH-MCNC: 0.7 g/dL (ref 0.7–1.3)
GAMMA GLOB SERPL ELPH-MCNC: 0.8 g/dL (ref 0.4–1.8)
GLOBULIN, TOTAL: 2.5 g/dL (ref 2.2–3.9)
IgA: 139 mg/dL (ref 61–437)
IgG (Immunoglobin G), Serum: 708 mg/dL (ref 700–1600)
IgM (Immunoglobulin M), Srm: 244 mg/dL — ABNORMAL HIGH (ref 15–143)
Total Protein ELP: 6.2 g/dL (ref 6.0–8.5)

## 2018-02-02 ENCOUNTER — Other Ambulatory Visit: Payer: Self-pay | Admitting: Radiology

## 2018-02-03 ENCOUNTER — Ambulatory Visit (HOSPITAL_COMMUNITY)
Admission: RE | Admit: 2018-02-03 | Discharge: 2018-02-03 | Disposition: A | Discharge status: Home / Self Care | Payer: Medicare Other | Source: Ambulatory Visit | Attending: Hematology and Oncology | Admitting: Hematology and Oncology

## 2018-02-03 ENCOUNTER — Encounter (HOSPITAL_COMMUNITY): Payer: Self-pay

## 2018-02-03 DIAGNOSIS — D472 Monoclonal gammopathy: Secondary | ICD-10-CM | POA: Diagnosis not present

## 2018-02-03 DIAGNOSIS — D7589 Other specified diseases of blood and blood-forming organs: Secondary | ICD-10-CM | POA: Diagnosis not present

## 2018-02-03 LAB — CBC WITH DIFFERENTIAL/PLATELET
Basophils Absolute: 0 10*3/uL (ref 0.0–0.1)
Basophils Relative: 0 %
Eosinophils Absolute: 0.1 10*3/uL (ref 0.0–0.7)
Eosinophils Relative: 1 %
HCT: 41.5 % (ref 39.0–52.0)
HEMOGLOBIN: 13.9 g/dL (ref 13.0–17.0)
LYMPHS ABS: 1.1 10*3/uL (ref 0.7–4.0)
Lymphocytes Relative: 12 %
MCH: 31.3 pg (ref 26.0–34.0)
MCHC: 33.5 g/dL (ref 30.0–36.0)
MCV: 93.5 fL (ref 78.0–100.0)
MONOS PCT: 10 %
Monocytes Absolute: 0.9 10*3/uL (ref 0.1–1.0)
NEUTROS ABS: 6.6 10*3/uL (ref 1.7–7.7)
NEUTROS PCT: 77 %
Platelets: 229 10*3/uL (ref 150–400)
RBC: 4.44 MIL/uL (ref 4.22–5.81)
RDW: 13.3 % (ref 11.5–15.5)
WBC: 8.7 10*3/uL (ref 4.0–10.5)

## 2018-02-03 LAB — PROTIME-INR
INR: 1.15
PROTHROMBIN TIME: 14.6 s (ref 11.4–15.2)

## 2018-02-03 MED ORDER — FENTANYL CITRATE (PF) 100 MCG/2ML IJ SOLN
INTRAMUSCULAR | Status: AC
  Filled 2018-02-03: qty 4

## 2018-02-03 MED ORDER — SODIUM CHLORIDE 0.9 % IV SOLN
INTRAVENOUS | Status: DC
  Administered 2018-02-03: 08:00:00 via INTRAVENOUS

## 2018-02-03 MED ORDER — LIDOCAINE HCL (PF) 1 % IJ SOLN
INTRAMUSCULAR | Status: AC | PRN
  Administered 2018-02-03: 10 mL

## 2018-02-03 MED ORDER — MIDAZOLAM HCL 2 MG/2ML IJ SOLN
INTRAMUSCULAR | Status: AC | PRN
  Administered 2018-02-03: 1 mg via INTRAVENOUS

## 2018-02-03 MED ORDER — HYDROCODONE-ACETAMINOPHEN 5-325 MG PO TABS: 1.0000 | ORAL_TABLET | ORAL | Status: DC | PRN

## 2018-02-03 MED ORDER — MIDAZOLAM HCL 2 MG/2ML IJ SOLN
INTRAMUSCULAR | Status: AC
  Filled 2018-02-03: qty 4

## 2018-02-03 MED ORDER — FENTANYL CITRATE (PF) 100 MCG/2ML IJ SOLN
INTRAMUSCULAR | Status: AC | PRN
  Administered 2018-02-03: 50 ug via INTRAVENOUS

## 2018-02-03 NOTE — Consult Note (Signed)
Chief Complaint: Patient was seen in consultation today for CT-guided bone marrow biopsy  Referring Physician(s): Perlov,Mikhail G  Supervising Physician: Arne Cleveland  Patient Status: [place]  History of Present Illness: Glen Bolton is a 77 y.o. male with history of IgM monoclonal gammopathy and elevated free kappa/lambda light chains/ratio who presents today for CT-guided bone marrow biopsy for further evaluation.  Past Medical History:  Diagnosis Date  . Arthritis   . Atrial fibrillation ([place])   . Dysrhythmia   . GERD (gastroesophageal reflux disease)   . Glaucoma   . Hard of hearing   . History of gastritis   . History of kidney stones   . History of stomach ulcers   . Hx of migraines    no migraines since he was in his 23's  . Hypertension   . Peripheral neuropathy 12/26/2017  . Sleep apnea    CPAP    Past Surgical History:  Procedure Laterality Date  . CATARACT EXTRACTION Bilateral   . COLONOSCOPY    . ESOPHAGOGASTRODUODENOSCOPY    . INGUINAL HERNIA REPAIR Left    x 2  . Rotator cuff surgery Right 2013  . TOTAL HIP ARTHROPLASTY Left 10/27/2015   Procedure: TOTAL HIP ARTHROPLASTY ANTERIOR APPROACH;  Surgeon: Frederik Pear, MD;  Location: [facility];  Service: Orthopedics;  Laterality: Left;    Allergies: Patient has no known allergies.  Medications: Prior to Admission medications   Medication Sig Start Date End Date Taking? Authorizing Provider  bimatoprost (LUMIGAN) 0.01 % SOLN Place 1 drop into both eyes at bedtime.    Yes [provider]  metoprolol succinate (TOPROL-XL) 50 MG 24 hr tablet take 1 tablet by mouth once daily TAKE WITH OR IMMEDIATELY FOLLOWING A MEAL 10/05/17  Yes Troy Sine, MD  Multiple Vitamins-Minerals (MULTIVITAMIN & MINERAL PO) Take 1 tablet by mouth daily.   Yes [provider]  olmesartan (BENICAR) 20 MG tablet TK 1 T PO D 05/23/17  Yes [provider]  Probiotic Product (PROBIOTIC PO) Take 1  tablet by mouth daily.    Yes [provider]  XARELTO 20 MG TABS tablet TAKE 1 TABLET BY MOUTH ONCE DAILY WITH SUPPER 11/21/17   Minus Breeding, MD     Family History  Problem Relation Age of Onset  . Hyperlipidemia Brother   . Stroke Sister 43    Social History   Socioeconomic History  . Marital status: Married    Spouse name: Not on file  . Number of children: 2  . Years of education: Not on file  . Highest education level: Not on file  Occupational History  . Occupation: [employer]  Social Needs  . Financial resource strain: Not on file  . Food insecurity:    Worry: Not on file    Inability: Not on file  . Transportation needs:    Medical: Not on file    Non-medical: Not on file  Tobacco Use  . Smoking status: Never Smoker  . Smokeless tobacco: Never Used  Substance and Sexual Activity  . Alcohol use: Yes    Alcohol/week: 0.6 oz    Types: 1 Glasses of wine per week    Comment: daily  . Drug use: No  . Sexual activity: Not on file  Lifestyle  . Physical activity:    Days per week: Not on file    Minutes per session: Not on file  . Stress: Not on file  Relationships  . Social connections:  Talks on phone: Not on file    Gets together: Not on file    Attends religious service: Not on file    Active member of club or organization: Not on file    Attends meetings of clubs or organizations: Not on file    Relationship status: Not on file  Other Topics Concern  . Not on file  Social History Narrative   Lives with wife   Caffeine use: 2 cups coffee daily   [employer]   Right handed      Review of Systems currently denies fever, headache, chest pain, worsening dyspnea, abdominal pain, nausea, vomiting or bleeding.  He has recently finished treatment for a right ear infection, he is hard of hearing, he has occasional cough, some back pain while coughing.  Vital Signs: BP (!) 185/112 (BP Location: Right Arm)   Pulse 88   Temp 98.1 F (36.7 C)  (Oral)   Resp 18   SpO2 97%   Physical Exam awake, alert.  Chest with distant BS bilaterally with rare exp wheeze;  Heart with normal rate, irregular rhythm.  Abdomen obese, soft, mild generalized tenderness to palpation.  Trace pretibial edema bilaterally.  Imaging: No results found.  Labs:  CBC: Recent Labs    01/24/18 0932 02/03/18 0722  WBC 8.5 8.7  HGB 15.1 13.9  HCT 45.4 41.5  PLT 141 229    COAGS: Recent Labs    02/03/18 0722  INR 1.15    BMP: Recent Labs    01/24/18 0932  NA 139  K 3.9  CL 106  CO2 26  GLUCOSE 109  BUN 17  CALCIUM 9.1  CREATININE 0.85  GFRNONAA >60  GFRAA >60    LIVER FUNCTION TESTS: Recent Labs    12/26/17 0936 01/24/18 0932  BILITOT  --  0.7  AST  --  31  ALT  --  31  ALKPHOS  --  81  PROT 6.7 7.0  ALBUMIN  --  4.0    TUMOR MARKERS: No results for input(s): AFPTM, CEA, CA199, CHROMGRNA in the last 8760 hours.  Assessment and Plan:  77 y.o. male with history of IgM monoclonal gammopathy and elevated free kappa/lambda light chains/ratio who presents today for CT-guided bone marrow biopsy for further evaluation.Risks and benefits discussed with the patient/spouse including, but not limited to bleeding, infection, damage to adjacent structures or low yield requiring additional tests.  All of the patient's questions were answered, patient is agreeable to proceed. Consent signed and in chart.    Thank you for this interesting consult.  I greatly enjoyed meeting Glen Bolton and look forward to participating in their care.  A copy of this report was sent to the requesting provider on this date.  Electronically Signed: D. Rowe Robert, PA-C 02/03/2018, 8:41 AM   I spent a total of 25 minutes    in face to face in clinical consultation, greater than 50% of which was counseling/coordinating care for CT-guided bone marrow biopsy

## 2018-02-03 NOTE — Discharge Instructions (Signed)
Moderate Conscious Sedation, Adult, Care After These instructions provide you with information about caring for yourself after your procedure. Your health care provider may also give you more specific instructions. Your treatment has been planned according to current medical practices, but problems sometimes occur. Call your health care provider if you have any problems or questions after your procedure. What can I expect after the procedure? After your procedure, it is common:  To feel sleepy for several hours.  To feel clumsy and have poor balance for several hours.  To have poor judgment for several hours.  To vomit if you eat too soon.  Follow these instructions at home: For at least 24 hours after the procedure:   Do not: ? Participate in activities where you could fall or become injured. ? Drive. ? Use heavy machinery. ? Drink alcohol. ? Take sleeping pills or medicines that cause drowsiness. ? Make important decisions or sign legal documents. ? Take care of children on your own.  Rest. Eating and drinking  Follow the diet recommended by your health care provider.  If you vomit: ? Drink water, juice, or soup when you can drink without vomiting. ? Make sure you have little or no nausea before eating solid foods. General instructions  Have a responsible adult stay with you until you are awake and alert.  Take over-the-counter and prescription medicines only as told by your health care provider.  If you smoke, do not smoke without supervision.  Keep all follow-up visits as told by your health care provider. This is important. Contact a health care provider if:  You keep feeling nauseous or you keep vomiting.  You feel light-headed.  You develop a rash.  You have a fever. Get help right away if:  You have trouble breathing. This information is not intended to replace advice given to you by your health care provider. Make sure you discuss any questions you have  with your health care provider. Document Released: 06/13/2013 Document Revised: 01/26/2016 Document Reviewed: 12/13/2015 Elsevier Interactive Patient Education  2018 [place].   Bone Marrow Aspiration and Bone Marrow Biopsy, Adult, Care After This sheet gives you information about how to care for yourself after your procedure. Your health care provider may also give you more specific instructions. If you have problems or questions, contact your health care provider. What can I expect after the procedure? After the procedure, it is common to have:  Mild pain and tenderness.  Swelling.  Bruising.  Follow these instructions at home:  Take over-the-counter or prescription medicines only as told by your health care provider.  Do not take baths, swim, or use a hot tub until your health care provider approves. Ask if you can take a shower or have a sponge bath.  You may shower tomorrow.  Follow instructions from your health care provider about how to take care of the puncture site. Make sure you: ? Wash your hands with soap and water before you change your bandage (dressing). If soap and water are not available, use hand sanitizer. ? Change your dressing as told by your health care provider.  You may remove your dressing tomorrow.  Check your puncture siteevery day for signs of infection. Check for: ? More redness, swelling, or pain. ? More fluid or blood. ? Warmth. ? Pus or a bad smell.  Return to your normal activities as told by your health care provider. Ask your health care provider what activities are safe for you.  Do not drive  for 24 hours if you were given a medicine to help you relax (sedative). °· Keep all follow-up visits as told by your health care provider. This is important. °Contact a health care provider if: °· You have more redness, swelling, or pain around the puncture site. °· You have more fluid or blood coming from the puncture site. °· Your puncture site feels  warm to the touch. °· You have pus or a bad smell coming from the puncture site. °· You have a fever. °· Your pain is not controlled with medicine. °This information is not intended to replace advice given to you by your health care provider. Make sure you discuss any questions you have with your health care provider. °Document Released: 03/12/2005 Document Revised: 03/12/2016 Document Reviewed: 02/04/2016 °Elsevier Interactive Patient Education © 2018 [employer] ° °

## 2018-02-03 NOTE — Procedures (Signed)
  Procedure: CT bone marrow biopsy R iliac EBL:   minimal Complications:  none immediate  See full dictation in [facility].  Dillard Cannon MD Main # [phone] Pager  [phone]

## 2018-02-06 ENCOUNTER — Ambulatory Visit (HOSPITAL_COMMUNITY)
Admission: RE | Admit: 2018-02-06 | Discharge: 2018-02-06 | Disposition: A | Discharge status: Home / Self Care | Payer: Medicare Other | Source: Ambulatory Visit | Attending: Hematology and Oncology | Admitting: Hematology and Oncology

## 2018-02-06 DIAGNOSIS — J320 Chronic maxillary sinusitis: Secondary | ICD-10-CM | POA: Diagnosis not present

## 2018-02-06 DIAGNOSIS — D472 Monoclonal gammopathy: Secondary | ICD-10-CM | POA: Diagnosis not present

## 2018-02-06 DIAGNOSIS — C8300 Small cell B-cell lymphoma, unspecified site: Secondary | ICD-10-CM | POA: Diagnosis not present

## 2018-02-06 DIAGNOSIS — I700 Atherosclerosis of aorta: Secondary | ICD-10-CM | POA: Diagnosis not present

## 2018-02-06 LAB — GLUCOSE, CAPILLARY: Glucose-Capillary: 184 mg/dL — ABNORMAL HIGH (ref 65–99)

## 2018-02-06 MED ORDER — FLUDEOXYGLUCOSE F - 18 (FDG) INJECTION
13.0100 | Freq: Once | INTRAVENOUS | Status: AC | PRN
  Administered 2018-02-06: 13.01 via INTRAVENOUS

## 2018-02-07 DIAGNOSIS — J209 Acute bronchitis, unspecified: Secondary | ICD-10-CM | POA: Diagnosis not present

## 2018-02-07 DIAGNOSIS — T148XXA Other injury of unspecified body region, initial encounter: Secondary | ICD-10-CM | POA: Diagnosis not present

## 2018-02-09 LAB — CHROMOSOME ANALYSIS, BONE MARROW

## 2018-02-13 NOTE — Progress Notes (Signed)
HEMATOLOGY/ONCOLOGY CONSULTATION NOTE  Date of Service: 02/14/2018  Patient Care Team: Lujean Amel, MD as PCP - General (Family Medicine) Minus Breeding, MD as PCP - Cardiology (Cardiology)  CHIEF COMPLAINTS/PURPOSE OF CONSULTATION:  Elevated serum kappa light chains  HISTORY OF PRESENTING ILLNESS:   Glen Bolton is a wonderful 77 y.o. male who has been referred to [place] by my colleague Dr Grace Isaac for evaluation and management of Elevated serum kappa light chains. He is accompanied today by his wife. The pt reports that he is doing well overall.   The pt reports that he had no symptoms before his abnormal lab results prompting his initial visit with Dr Lebron Conners. He denies any concerns for inflammation besides an ear infection for the last 3 weeks and a viral infection as well. He is being followed by his PCP and has had a 20dB drop in his right ear threshold. He has had upper respiratory symptoms including a bad cough that hurt his abdomen which is coupled by a bruise at his lower abdomen. He denies any trauma to the area but does take Xarelto for his Afib. He has been treated with a z-pack and other antibiotics. He has had tubes put in his ear 3 times in the past but denies any ear pain.   He notes that he has arthritis in his knees but denies concern for rheumatoid arthritis.   Of note prior to the patient's visit today, pt has had PET/CT completed on 02/06/18 with results revealing 1. No hypermetabolic mass or adenopathy identified. 2. No evidence for hepatomegaly or splenomegaly. 3. Mild low level FDG uptake throughout the bone marrow is noted. Nonspecific and may be physiologic. 4.  Aortic Atherosclerosis (ICD10-I70.0). 5. Bilateral maxillary sinus disease.   Surgical pathology 02/03/18 revealed HYPERCELLULAR BONE MARROW FOR AGE WITH TRILINEAGE HEMATOPOIESIS. - A MINOR PLASMA CELL COMPONENT WITH KAPPA LIGHT CHAIN EXCESS. - A FEW SMALL LYMPHOID AGGREGATES PRESENT.   Most  recent lab results (02/03/18) of CBC w/ diff  is as follows: all values are WNL. UPEP Light chains 01/26/18 showed Total Protein ur/day at 156, Free kappa lt chains, ur at 93.20, Free lambda lt chains at 7.61, Free Kappa/Lambda ratio at 12.25.  MMP 01/24/18 showed all values WNL except for IgM at 244. M Protein not observed.   On review of systems, pt reports cough, ear infection, lower abdomen superficial bruising, and denies fevers, chills, night sweats, leg swelling, testicular pain or swelling, and any other symptoms.    MEDICAL HISTORY:  Past Medical History:  Diagnosis Date  . Arthritis   . Atrial fibrillation ([place])   . Dysrhythmia   . GERD (gastroesophageal reflux disease)   . Glaucoma   . Hard of hearing   . History of gastritis   . History of kidney stones   . History of stomach ulcers   . Hx of migraines    no migraines since he was in his 65's  . Hypertension   . Peripheral neuropathy 12/26/2017  . Sleep apnea    CPAP    SURGICAL HISTORY: Past Surgical History:  Procedure Laterality Date  . CATARACT EXTRACTION Bilateral   . COLONOSCOPY    . ESOPHAGOGASTRODUODENOSCOPY    . INGUINAL HERNIA REPAIR Left    x 2  . Rotator cuff surgery Right 2013  . TOTAL HIP ARTHROPLASTY Left 10/27/2015   Procedure: TOTAL HIP ARTHROPLASTY ANTERIOR APPROACH;  Surgeon: Frederik Pear, MD;  Location: [facility];  Service: Orthopedics;  Laterality: Left;  SOCIAL HISTORY: Social History   Socioeconomic History  . Marital status: Married    Spouse name: Not on file  . Number of children: 2  . Years of education: Not on file  . Highest education level: Not on file  Occupational History  . Occupation: [employer]  Social Needs  . Financial resource strain: Not on file  . Food insecurity:    Worry: Not on file    Inability: Not on file  . Transportation needs:    Medical: Not on file    Non-medical: Not on file  Tobacco Use  . Smoking status: Never Smoker  . Smokeless tobacco: Never  Used  Substance and Sexual Activity  . Alcohol use: Yes    Alcohol/week: 0.6 oz    Types: 1 Glasses of wine per week    Comment: daily  . Drug use: No  . Sexual activity: Not on file  Lifestyle  . Physical activity:    Days per week: Not on file    Minutes per session: Not on file  . Stress: Not on file  Relationships  . Social connections:    Talks on phone: Not on file    Gets together: Not on file    Attends religious service: Not on file    Active member of club or organization: Not on file    Attends meetings of clubs or organizations: Not on file    Relationship status: Not on file  . Intimate partner violence:    Fear of current or ex partner: Not on file    Emotionally abused: Not on file    Physically abused: Not on file    Forced sexual activity: Not on file  Other Topics Concern  . Not on file  Social History Narrative   Lives with wife   Caffeine use: 2 cups coffee daily   [employer]   Right handed     FAMILY HISTORY: Family History  Problem Relation Age of Onset  . Hyperlipidemia Brother   . Stroke Sister 83    ALLERGIES:  has No Known Allergies.  MEDICATIONS:  Current Outpatient Medications  Medication Sig Dispense Refill  . bimatoprost (LUMIGAN) 0.01 % SOLN Place 1 drop into both eyes at bedtime.     . metoprolol succinate (TOPROL-XL) 50 MG 24 hr tablet take 1 tablet by mouth once daily TAKE WITH OR IMMEDIATELY FOLLOWING A MEAL 90 tablet 3  . Multiple Vitamins-Minerals (MULTIVITAMIN & MINERAL PO) Take 1 tablet by mouth daily.    Marland Kitchen olmesartan (BENICAR) 20 MG tablet TK 1 T PO D  4  . Probiotic Product (PROBIOTIC PO) Take 1 tablet by mouth daily.     Alveda Reasons 20 MG TABS tablet TAKE 1 TABLET BY MOUTH ONCE DAILY WITH SUPPER 30 tablet 6   No current facility-administered medications for this visit.     REVIEW OF SYSTEMS:    10 Point review of Systems was done is negative except as noted above.  PHYSICAL EXAMINATION: . Vitals:   02/14/18 0847    BP: (!) 153/86  Pulse: 72  Resp: 18  Temp: 98.4 F (36.9 C)  SpO2: 98%   Filed Weights   02/14/18 0847  Weight: 254 lb 9.6 oz (115.5 kg)   .Body mass index is 36.53 kg/m.  GENERAL:alert, in no acute distress and comfortable SKIN: no acute rashes, no significant lesions EYES: conjunctiva are pink and non-injected, sclera anicteric OROPHARYNX: MMM, no exudates, no oropharyngeal erythema or ulceration NECK: supple,  no JVD LYMPH:  no palpable lymphadenopathy in the cervical, axillary or inguinal regions LUNGS: clear to auscultation b/l with normal respiratory effort HEART: regular rate & rhythm ABDOMEN:  normoactive bowel sounds , non tender, not distended. Extremity: no pedal edema PSYCH: alert & oriented x 3 with fluent speech NEURO: no focal motor/sensory deficits  LABORATORY DATA:  I have reviewed the data as listed  . CBC Latest Ref Rng & Units 02/03/2018 01/24/2018 10/29/2015  WBC 4.0 - 10.5 K/uL 8.7 8.5 12.3(H)  Hemoglobin 13.0 - 17.0 g/dL 13.9 15.1 11.8(L)  Hematocrit 39.0 - 52.0 % 41.5 45.4 35.5(L)  Platelets 150 - 400 K/uL 229 141 145(L)    . CMP Latest Ref Rng & Units 01/24/2018 12/26/2017 10/28/2015  Glucose 70 - 140 mg/dL 109 - 157(H)  BUN 7 - 26 mg/dL 17 - 15  Creatinine 0.70 - 1.30 mg/dL 0.85 - 1.01  Sodium 136 - 145 mmol/L 139 - 137  Potassium 3.5 - 5.1 mmol/L 3.9 - 3.9  Chloride 98 - 109 mmol/L 106 - 106  CO2 22 - 29 mmol/L 26 - 22  Calcium 8.4 - 10.4 mg/dL 9.1 - 8.2(L)  Total Protein 6.4 - 8.3 g/dL 7.0 6.7 -  Total Bilirubin 0.2 - 1.2 mg/dL 0.7 - -  Alkaline Phos 40 - 150 U/L 81 - -  AST 5 - 34 U/L 31 - -  ALT 0 - 55 U/L 31 - -   02/03/18 BM Report:    02/03/18 Flow Cytometry:   02/03/18 Cytogenetics:    RADIOGRAPHIC STUDIES: I have personally reviewed the radiological images as listed and agreed with the findings in the report. Nm Pet Image Initial (pi) Whole Body  Result Date: 02/06/2018 CLINICAL DATA:  Initial treatment strategy for  Waldenstrom's macroglobulinemia/lymphoplasmacytic lymphoma. EXAM: NUCLEAR MEDICINE PET WHOLE BODY TECHNIQUE: 13.01 mCi F-18 FDG was injected intravenously. Full-ring PET imaging was performed from the skull base to thigh after the radiotracer. CT data was obtained and used for attenuation correction and anatomic localization. Fasting blood glucose: 184 mg/dl COMPARISON:  None. FINDINGS: Mediastinal blood pool activity: SUV max 3.15 HEAD/NECK: No hypermetabolic activity in the scalp. No hypermetabolic cervical lymph nodes. Incidental CT findings: There is asymmetric opacification of the left maxillary sinus. Polyp or retention cyst noted in the right maxillary sinus along with mild posterior wall mucosal thickening. CHEST: No hypermetabolic mediastinal or hilar nodes. No suspicious pulmonary nodules on the CT scan. Incidental CT findings: Aortic atherosclerosis noted. ABDOMEN/PELVIS: No abnormal hypermetabolic activity within the liver, pancreas, adrenal glands, or spleen. No hypermetabolic lymph nodes in the abdomen or pelvis. Normal size spleen. Incidental CT findings: There is mild aortic atherosclerosis. No aneurysm. No adenopathy identified within the abdomen or pelvis. There is mild soft tissue haziness within the small bowel mesentery containing normal size lymph nodes. There are several low-density foci within the left lobe of liver without corresponding increased FDG uptake. Favor benign cysts. SKELETON: No focal hypermetabolic bone lesions identified within the axial or appendicular skeleton. The SUV max within the left iliac bone is equal to 3.10. Incidental CT findings: Spondylosis identified within the cervicothoracic and lumbar spine. No suspicious lytic bone lesions identified. Previous left hip arthroplasty. EXTREMITIES: No abnormal hypermetabolic activity in the lower extremities. Incidental CT findings: none IMPRESSION: 1. No hypermetabolic mass or adenopathy identified. 2. No evidence for  hepatomegaly or splenomegaly. 3. Mild low level FDG uptake throughout the bone marrow is noted. Nonspecific and may be physiologic. 4.  Aortic Atherosclerosis (ICD10-I70.0). 5.  Bilateral maxillary sinus disease. Electronically Signed   By: Kerby Moors M.D.   On: 02/06/2018 09:20   Ct Biopsy  Result Date: 02/03/2018 CLINICAL DATA:  IgM monoclonal gammopathy and elevated free kappa/lambda light chains/ratio EXAM: CT GUIDED DEEP ILIAC BONE ASPIRATION AND CORE BIOPSY TECHNIQUE: Patient was placed supine on the CT gantry and limited axial scans through the pelvis were obtained. Appropriate skin entry site was identified. Skin site was marked, prepped with chlorhexidine, draped in usual sterile fashion, and infiltrated locally with 1% lidocaine. Intravenous Fentanyl and Versed were administered as conscious sedation during continuous monitoring of the patient's level of consciousness and physiological / cardiorespiratory status by the radiology RN, with a total moderate sedation time of 10 minutes. Under CT fluoroscopic guidance an 11-gauge Cook trocar bone needle was advanced into the right iliac bone just lateral to the sacroiliac joint. Once needle tip position was confirmed, core and aspiration samples were obtained, submitted to pathology for approval. Post procedure scans show no hematoma or fracture. Patient tolerated procedure well. COMPLICATIONS: COMPLICATIONS none IMPRESSION: 1. Technically successful CT guided right iliac bone core and aspiration biopsy. Electronically Signed   By: Lucrezia Europe M.D.   On: 02/03/2018 11:14   Ct Bone Marrow Biopsy & Aspiration  Result Date: 02/03/2018 CLINICAL DATA:  IgM monoclonal gammopathy and elevated free kappa/lambda light chains/ratio EXAM: CT GUIDED DEEP ILIAC BONE ASPIRATION AND CORE BIOPSY TECHNIQUE: Patient was placed supine on the CT gantry and limited axial scans through the pelvis were obtained. Appropriate skin entry site was identified. Skin site was  marked, prepped with chlorhexidine, draped in usual sterile fashion, and infiltrated locally with 1% lidocaine. Intravenous Fentanyl and Versed were administered as conscious sedation during continuous monitoring of the patient's level of consciousness and physiological / cardiorespiratory status by the radiology RN, with a total moderate sedation time of 10 minutes. Under CT fluoroscopic guidance an 11-gauge Cook trocar bone needle was advanced into the right iliac bone just lateral to the sacroiliac joint. Once needle tip position was confirmed, core and aspiration samples were obtained, submitted to pathology for approval. Post procedure scans show no hematoma or fracture. Patient tolerated procedure well. COMPLICATIONS: COMPLICATIONS none IMPRESSION: 1. Technically successful CT guided right iliac bone core and aspiration biopsy. Electronically Signed   By: Lucrezia Europe M.D.   On: 02/03/2018 11:14    ASSESSMENT & PLAN:   77 y.o. male with  1. Elevated serum kappa light chains   -Discussed patient's most recent labs from 02/03/18 and 01/24/18, Free kappa lt chains in the urine were elevated at 93.20. No anemia, no abnormal kidney functions. -PET/CT from 02/06/18 did not reveal any bone lesions and revealed Mild low level FDG uptake throughout the bone marrow is noted. Nonspecific and may be physiologic.  -IgM increase noted without M-spike -Reviewed BM bx pathology which indicated 2% plasma cells -Discussed that the pt does not meet CRAB criteria and no other evidence of overt myeloma., and his labs may suggest a reactive process -Pt will let me or his PCP know if he develops any constitutional symptoms or new, concerning symptoms in the interim -Follow up with PCP for age-appropriate cancer screening and symptom-directed work up -Will see pt back in 4 months  -Continue follow up with neurology for management of neuropathy and Vitamin B12 deficiency  2. Superficial abdominal hematoma -Superficial  bruising of lower abdomen should continue to resolve    RTC with Dr Irene Limbo in 4 months  Labs 1 week prior  to clinic visit   All of the patients questions were answered with apparent satisfaction. The patient knows to call the clinic with any problems, questions or concerns.  . The total time spent in the appointment was 25 minutes and more than 50% was on counseling and direct patient cares.      Sullivan Lone MD MS AAHIVMS [facility] Hematology/Oncology Physician [facility]  (Office):       [phone] (Work cell):  [phone] (Fax):           [phone]  02/14/2018 9:41 AM  I, Baldwin Jamaica, am acting as a Education administrator for Dr Irene Limbo.   .I have reviewed the above documentation for accuracy and completeness, and I agree with the above. Brunetta Genera MD

## 2018-02-14 ENCOUNTER — Telehealth: Payer: Self-pay | Admitting: Hematology

## 2018-02-14 ENCOUNTER — Inpatient Hospital Stay: Payer: Medicare Other | Attending: Hematology and Oncology | Admitting: Hematology

## 2018-02-14 VITALS — BP 153/86 | HR 72 | Temp 98.4°F | Resp 18 | Ht 70.0 in | Wt 254.6 lb

## 2018-02-14 DIAGNOSIS — Z87442 Personal history of urinary calculi: Secondary | ICD-10-CM

## 2018-02-14 DIAGNOSIS — I700 Atherosclerosis of aorta: Secondary | ICD-10-CM | POA: Diagnosis not present

## 2018-02-14 DIAGNOSIS — K219 Gastro-esophageal reflux disease without esophagitis: Secondary | ICD-10-CM

## 2018-02-14 DIAGNOSIS — J320 Chronic maxillary sinusitis: Secondary | ICD-10-CM

## 2018-02-14 DIAGNOSIS — Z79899 Other long term (current) drug therapy: Secondary | ICD-10-CM

## 2018-02-14 DIAGNOSIS — M129 Arthropathy, unspecified: Secondary | ICD-10-CM | POA: Diagnosis not present

## 2018-02-14 DIAGNOSIS — Z7901 Long term (current) use of anticoagulants: Secondary | ICD-10-CM

## 2018-02-14 DIAGNOSIS — I4891 Unspecified atrial fibrillation: Secondary | ICD-10-CM | POA: Diagnosis not present

## 2018-02-14 DIAGNOSIS — I10 Essential (primary) hypertension: Secondary | ICD-10-CM | POA: Insufficient documentation

## 2018-02-14 DIAGNOSIS — Z8711 Personal history of peptic ulcer disease: Secondary | ICD-10-CM | POA: Diagnosis not present

## 2018-02-14 DIAGNOSIS — M069 Rheumatoid arthritis, unspecified: Secondary | ICD-10-CM | POA: Diagnosis not present

## 2018-02-14 DIAGNOSIS — S301XXA Contusion of abdominal wall, initial encounter: Secondary | ICD-10-CM | POA: Diagnosis not present

## 2018-02-14 DIAGNOSIS — D472 Monoclonal gammopathy: Secondary | ICD-10-CM | POA: Insufficient documentation

## 2018-02-14 DIAGNOSIS — G603 Idiopathic progressive neuropathy: Secondary | ICD-10-CM | POA: Diagnosis not present

## 2018-02-14 DIAGNOSIS — G4730 Sleep apnea, unspecified: Secondary | ICD-10-CM | POA: Diagnosis not present

## 2018-02-14 DIAGNOSIS — G629 Polyneuropathy, unspecified: Secondary | ICD-10-CM

## 2018-02-14 NOTE — Telephone Encounter (Signed)
Scheduled appt per 6/11 los - gave patient aVS and calender per los.  

## 2018-03-01 DIAGNOSIS — H65493 Other chronic nonsuppurative otitis media, bilateral: Secondary | ICD-10-CM | POA: Diagnosis not present

## 2018-03-01 DIAGNOSIS — H6983 Other specified disorders of Eustachian tube, bilateral: Secondary | ICD-10-CM | POA: Diagnosis not present

## 2018-03-01 DIAGNOSIS — J329 Chronic sinusitis, unspecified: Secondary | ICD-10-CM | POA: Insufficient documentation

## 2018-03-01 DIAGNOSIS — H6993 Unspecified Eustachian tube disorder, bilateral: Secondary | ICD-10-CM | POA: Insufficient documentation

## 2018-03-01 DIAGNOSIS — J324 Chronic pansinusitis: Secondary | ICD-10-CM | POA: Diagnosis not present

## 2018-03-06 DIAGNOSIS — R7303 Prediabetes: Secondary | ICD-10-CM | POA: Diagnosis not present

## 2018-03-06 DIAGNOSIS — Z136 Encounter for screening for cardiovascular disorders: Secondary | ICD-10-CM | POA: Diagnosis not present

## 2018-03-06 DIAGNOSIS — E7800 Pure hypercholesterolemia, unspecified: Secondary | ICD-10-CM | POA: Diagnosis not present

## 2018-03-06 DIAGNOSIS — Z0001 Encounter for general adult medical examination with abnormal findings: Secondary | ICD-10-CM | POA: Diagnosis not present

## 2018-03-06 DIAGNOSIS — I10 Essential (primary) hypertension: Secondary | ICD-10-CM | POA: Diagnosis not present

## 2018-03-06 DIAGNOSIS — I4891 Unspecified atrial fibrillation: Secondary | ICD-10-CM | POA: Diagnosis not present

## 2018-03-06 DIAGNOSIS — Z1159 Encounter for screening for other viral diseases: Secondary | ICD-10-CM | POA: Diagnosis not present

## 2018-03-06 DIAGNOSIS — Z79899 Other long term (current) drug therapy: Secondary | ICD-10-CM | POA: Diagnosis not present

## 2018-03-15 DIAGNOSIS — H65493 Other chronic nonsuppurative otitis media, bilateral: Secondary | ICD-10-CM | POA: Diagnosis not present

## 2018-03-15 DIAGNOSIS — H903 Sensorineural hearing loss, bilateral: Secondary | ICD-10-CM | POA: Insufficient documentation

## 2018-03-16 DIAGNOSIS — H43812 Vitreous degeneration, left eye: Secondary | ICD-10-CM | POA: Diagnosis not present

## 2018-03-16 DIAGNOSIS — H401133 Primary open-angle glaucoma, bilateral, severe stage: Secondary | ICD-10-CM | POA: Diagnosis not present

## 2018-04-20 DIAGNOSIS — H65493 Other chronic nonsuppurative otitis media, bilateral: Secondary | ICD-10-CM | POA: Diagnosis not present

## 2018-04-20 DIAGNOSIS — J329 Chronic sinusitis, unspecified: Secondary | ICD-10-CM | POA: Diagnosis not present

## 2018-05-23 DIAGNOSIS — H6983 Other specified disorders of Eustachian tube, bilateral: Secondary | ICD-10-CM | POA: Diagnosis not present

## 2018-05-23 DIAGNOSIS — H903 Sensorineural hearing loss, bilateral: Secondary | ICD-10-CM | POA: Diagnosis not present

## 2018-05-23 DIAGNOSIS — H66002 Acute suppurative otitis media without spontaneous rupture of ear drum, left ear: Secondary | ICD-10-CM | POA: Diagnosis not present

## 2018-05-31 DIAGNOSIS — H01001 Unspecified blepharitis right upper eyelid: Secondary | ICD-10-CM | POA: Diagnosis not present

## 2018-05-31 DIAGNOSIS — H01004 Unspecified blepharitis left upper eyelid: Secondary | ICD-10-CM | POA: Diagnosis not present

## 2018-05-31 DIAGNOSIS — H26491 Other secondary cataract, right eye: Secondary | ICD-10-CM | POA: Diagnosis not present

## 2018-05-31 DIAGNOSIS — H524 Presbyopia: Secondary | ICD-10-CM | POA: Diagnosis not present

## 2018-05-31 DIAGNOSIS — H33311 Horseshoe tear of retina without detachment, right eye: Secondary | ICD-10-CM | POA: Diagnosis not present

## 2018-06-02 ENCOUNTER — Ambulatory Visit (INDEPENDENT_AMBULATORY_CARE_PROVIDER_SITE_OTHER): Payer: Medicare Other | Admitting: Neurology

## 2018-06-02 ENCOUNTER — Encounter: Payer: Self-pay | Admitting: Neurology

## 2018-06-02 VITALS — BP 153/81 | HR 80 | Ht 70.0 in | Wt 260.0 lb

## 2018-06-02 DIAGNOSIS — D472 Monoclonal gammopathy: Secondary | ICD-10-CM

## 2018-06-02 DIAGNOSIS — G603 Idiopathic progressive neuropathy: Secondary | ICD-10-CM | POA: Diagnosis not present

## 2018-06-02 NOTE — Progress Notes (Signed)
Reason for visit: Peripheral neuropathy  Glen Bolton is an 77 y.o. male  History of present illness:  Glen Bolton is a 77 year old right-handed white male with a history of a peripheral neuropathy associated with an IgM monoclonal antibody.  He has had a full work-up through oncology, a bone marrow biopsy appeared to be benign.  The patient is being followed through hematology/oncology currently.  The patient does have a moderate level peripheral neuropathy, he has a mild gait disorder, he reports a cold sensation in the feet when he is inactive, he is able to rest well at night however.  He denies any numbness or discomfort in the hands.  He has not had any falls, he has to be more cognizant as to how he is walking, he has to be careful going upstairs.  He has degenerative changes in the hips and knees, he has some fatigue and discomfort in the legs with walking longer distances.  He has had a prior left total hip replacement.  The patient has also had problems with vertigo in the past that at times has been severe associated with nausea.  He has had tubes put in the ears which has helped some, and this has also helped his hearing.  The patient returns to the office today for an evaluation.  Past Medical History:  Diagnosis Date  . Arthritis   . Atrial fibrillation ([place])   . Dysrhythmia   . GERD (gastroesophageal reflux disease)   . Glaucoma   . Hard of hearing   . History of gastritis   . History of kidney stones   . History of stomach ulcers   . Hx of migraines    no migraines since he was in his 2's  . Hypertension   . Peripheral neuropathy 12/26/2017  . Sleep apnea    CPAP    Past Surgical History:  Procedure Laterality Date  . CATARACT EXTRACTION Bilateral   . COLONOSCOPY    . ESOPHAGOGASTRODUODENOSCOPY    . INGUINAL HERNIA REPAIR Left    x 2  . Rotator cuff surgery Right 2013  . TOTAL HIP ARTHROPLASTY Left 10/27/2015   Procedure: TOTAL HIP ARTHROPLASTY ANTERIOR APPROACH;   Surgeon: Frederik Pear, MD;  Location: [place];  Service: Orthopedics;  Laterality: Left;    Family History  Problem Relation Age of Onset  . Hyperlipidemia Brother   . Stroke Sister 40    Social history:  reports that he has never smoked. He has never used smokeless tobacco. He reports that he drinks about 1.0 standard drinks of alcohol per week. He reports that he does not use drugs.   No Known Allergies  Medications:  Prior to Admission medications   Medication Sig Start Date End Date Taking? Authorizing Provider  bimatoprost (LUMIGAN) 0.01 % SOLN Place 1 drop into both eyes at bedtime.    Yes [provider]  metoprolol succinate (TOPROL-XL) 50 MG 24 hr tablet take 1 tablet by mouth once daily TAKE WITH OR IMMEDIATELY FOLLOWING A MEAL 10/05/17  Yes Troy Sine, MD  Multiple Vitamins-Minerals (MULTIVITAMIN & MINERAL PO) Take 1 tablet by mouth daily.   Yes [provider]  olmesartan (BENICAR) 20 MG tablet TK 1 T PO D 05/23/17  Yes [provider]  Probiotic Product (PROBIOTIC PO) Take 1 tablet by mouth daily.    Yes [provider]  XARELTO 20 MG TABS tablet TAKE 1 TABLET BY MOUTH ONCE DAILY WITH SUPPER 11/21/17  Yes Minus Breeding, MD  ROS:  Out of a complete 14 system review of symptoms, the patient complains only of the following symptoms, and all other reviewed systems are negative.  Numbness in the feet Dizziness  Blood pressure (!) 153/81, pulse 80, height 5' 10"  (1.778 m), weight 260 lb (117.9 kg).  Physical Exam  General: The patient is alert and cooperative at the time of the examination.  The patient is moderately to markedly obese.  Skin: Trace edema the ankles is seen bilaterally.   Neurologic Exam  Mental status: The patient is alert and oriented x 3 at the time of the examination. The patient has apparent normal recent and remote memory, with an apparently normal attention span and concentration ability.   Cranial  nerves: Facial symmetry is present. Speech is normal, no aphasia or dysarthria is noted. Extraocular movements are full. Visual fields are full.  Motor: The patient has good strength in all 4 extremities.  Sensory examination: Soft touch sensation is symmetric on the face, arms, and legs.  Coordination: The patient has good finger-nose-finger and heel-to-shin bilaterally.  Gait and station: The patient has a normal gait. Tandem gait is unsteady. Romberg is negative. No drift is seen.  Reflexes: Deep tendon reflexes are symmetric, but are depressed.    MRI cervical 12/09/17:  IMPRESSION: This MRI of the cervical spine without contrast shows the following: 1.   There is mild spinal stenosis at C3-C4, C4-C5, C5-C6 and C6-C7 due to combinations of disc bulges or protrusions and spondylosis.  There is various degrees of foraminal narrowing as detailed above but no definite nerve root compression. 2.   The spinal cord has normal signal.  * MRI scan images were reviewed online. I agree with the written report.    Assessment/Plan:  1.  Peripheral neuropathy  2.  Monoclonal antibody, IgM  3.  History of vertigo  4.  Gait disturbance  The patient is doing relatively well at this point, he does not require medications for his neuropathy discomfort.  He will be followed over time.  If he does begin to have significant discomfort, he is to contact our office.  He will follow-up in 1 year, sooner if needed.  Jill Alexanders MD 06/02/2018 8:23 AM  [facility] [address] [place], [place] [address]  Phone [phone] Fax [phone]

## 2018-06-05 NOTE — Progress Notes (Signed)
[facility] - Clinic Note  06/06/2018     CHIEF COMPLAINT Patient presents for Retina Evaluation   HISTORY OF PRESENT ILLNESS: Glen Bolton is a 77 y.o. male who presents to the clinic today for:   HPI    Retina Evaluation    In both eyes.  This started 1 year ago.  Associated Symptoms Pain and Photophobia.  Negative for Flashes, Blind Spot, Scalp Tenderness, Fever, Floaters, Glare, Jaw Claudication, Weight Loss, Distortion, Redness, Trauma, Shoulder/Hip pain and Fatigue.  Context:  distance vision, mid-range vision and near vision.  Treatments tried include laser and surgery.  Response to treatment was significant improvement.  I, the attending physician,  performed the HPI with the patient and updated documentation appropriately.          Comments    Referral of Dr. Patel for retina eval. Patient states for the past year he has had occasional eye pain OD , when he has the pain his eyes are sensitive to light, he applies cool compresses with relief. Patient states he went to neurologist who told him the eye pain was due to migraines. Pt reports he has an appointment with "glucoma doctor" Friday 06/09/18.  Denies floaters and flashes.Pt is using Lumigan gtt's and taking vit's QD.       Last edited by Zamora, Brian, MD on 06/06/2018  9:26 AM. (History)    Pt states he routinely seen by Dr. Patel; Pt states he sees Dr. Patel for glaucoma management; Pt states he is on glaucoma gtts but has been out x 4 days; Pt states he was told that he had a tear OD; Pt reports OU VA is stable; Pt denies any floaters, denies flashes, denies wavy VA; Pt endorses dx of HTN, Afib;   Referring physician: Patel, Niyati, OD [address] [place], [place] [address]  HISTORICAL INFORMATION:   Selected notes from the medical record:  Referred by Dr. Niyati Patel for concern of retinal tear OD LEE: 09.25.19 (N. Patel) [BCVA: OD: 20/20 OS: 20/25] Ocular Hx-POAG OU,  blepharitis OU, pseudo OU, PCO OD Former pt of Dr. Whitaker, but recently transferred to Dr. Patel PMH-arthritis, a fib, hx of migraines, neuropathy    CURRENT MEDICATIONS: Current Outpatient Medications (Ophthalmic Drugs)  Medication Sig  . bimatoprost (LUMIGAN) 0.01 % SOLN Place 1 drop into both eyes at bedtime.   . prednisoLONE acetate (PRED FORTE) 1 % ophthalmic suspension Place 1 drop into the right eye 4 (four) times daily for 7 days.   No current facility-administered medications for this visit.  (Ophthalmic Drugs)   Current Outpatient Medications (Other)  Medication Sig  . metoprolol succinate (TOPROL-XL) 50 MG 24 hr tablet take 1 tablet by mouth once daily TAKE WITH OR IMMEDIATELY FOLLOWING A MEAL  . Multiple Vitamins-Minerals (MULTIVITAMIN & MINERAL PO) Take 1 tablet by mouth daily.  . olmesartan (BENICAR) 20 MG tablet TK 1 T PO D  . Probiotic Product (PROBIOTIC PO) Take 1 tablet by mouth daily.   . XARELTO 20 MG TABS tablet TAKE 1 TABLET BY MOUTH ONCE DAILY WITH SUPPER   No current facility-administered medications for this visit.  (Other)      REVIEW OF SYSTEMS: ROS    Positive for: Cardiovascular, Eyes   Negative for: Constitutional, Gastrointestinal, Neurological, Skin, Genitourinary, Musculoskeletal, HENT, Endocrine, Respiratory, Psychiatric, Allergic/Imm, Heme/Lymph   Last edited by Zamora, Brian, MD on 06/06/2018  9:30 AM. (History)       ALLERGIES No Known   Allergies  PAST MEDICAL HISTORY Past Medical History:  Diagnosis Date  . Arthritis   . Atrial fibrillation ([place])   . Dysrhythmia   . GERD (gastroesophageal reflux disease)   . Glaucoma   . Hard of hearing   . History of gastritis   . History of kidney stones   . History of stomach ulcers   . Hx of migraines    no migraines since he was in his 50's  . Hypertension   . Peripheral neuropathy 12/26/2017  . Sleep apnea    CPAP   Past Surgical History:  Procedure Laterality Date  . CATARACT  EXTRACTION Bilateral 2014  . COLONOSCOPY    . ESOPHAGOGASTRODUODENOSCOPY    . EYE SURGERY    . INGUINAL HERNIA REPAIR Left    x 2  . Rotator cuff surgery Right 2013  . TOTAL HIP ARTHROPLASTY Left 10/27/2015   Procedure: TOTAL HIP ARTHROPLASTY ANTERIOR APPROACH;  Surgeon: Frederik Pear, MD;  Location: [facility];  Service: Orthopedics;  Laterality: Left;    FAMILY HISTORY Family History  Problem Relation Age of Onset  . Hyperlipidemia Brother   . Stroke Sister 20    SOCIAL HISTORY Social History   Tobacco Use  . Smoking status: Never Smoker  . Smokeless tobacco: Never Used  Substance Use Topics  . Alcohol use: Yes    Alcohol/week: 1.0 standard drinks    Types: 1 Glasses of wine per week    Comment: daily  . Drug use: No         OPHTHALMIC EXAM:  Base Eye Exam    Visual Acuity (Snellen - Linear)      Right Left   Dist Lowgap 20/20 20/20 -2       Tonometry (Tonopen, 9:08 AM)      Right Left   Pressure 22 16       Pupils      Dark Light Shape React APD   Right 4 3 Round Brisk None   Left 4 3 Round Brisk None       Visual Fields (Counting fingers)      Left Right    Full Full       Extraocular Movement      Right Left    Full, Ortho Full, Ortho       Neuro/Psych    Oriented x3:  Yes   Mood/Affect:  Normal       Dilation    Both eyes:  1.0% Mydriacyl, 2.5% Phenylephrine @ 9:08 AM        Slit Lamp and Fundus Exam    Slit Lamp Exam      Right Left   Lids/Lashes Dermatochalasis - upper lid, Meibomian gland dysfunction Dermatochalasis - upper lid, Meibomian gland dysfunction   Conjunctiva/Sclera Mild nasal and temporal Pinguecula Mild temporal Pinguecula   Cornea Arcus, 1+ Punctate epithelial erosions, Debris in tear film Arcus, 1+ Punctate epithelial erosions, Debris in tear film   Anterior Chamber Deep and quiet Deep and quiet   Iris Round and dilated Round and dilated   Lens PC IOL in good position PC IOL in good position   Vitreous Vitreous syneresis  Vitreous syneresis       Fundus Exam      Right Left   Disc Pink and Sharp, mild Pallor? Cupping, 2+ Pallor, very thin inf rim, mild Peripapillary atrophy   C/D Ratio 0.55 - narrow inf rim 0.8   Macula Flat, Blunted foveal reflex, RPE mottling and clumping, Drusen,  No heme or edema Blunted foveal reflex, Drusen, RPE mottling and clumping, No heme or edema   Vessels Vascular attenuation Vascular attenuation, AV crossing changes   Periphery Attached, small Horseshoe flap tear at 0900 with surrounding pigment -- no SRF, Reticular degeneration Attached, inf cobblestoning, Reticular degeneration        Refraction    Manifest Refraction (Retinoscopy)      Sphere Cylinder Axis Dist VA   Right -0.25 +0.25 170 20/20   Left -0.50 +0.50 135 20/20-1          IMAGING AND PROCEDURES  Imaging and Procedures for @TODAY@  OCT, Retina - OU - Both Eyes       Right Eye Quality was good. Central Foveal Thickness: 278. Progression has no prior data. Findings include normal foveal contour, no IRF, no SRF, retinal drusen .   Left Eye Quality was good. Central Foveal Thickness: 270. Progression has no prior data. Findings include normal foveal contour, no IRF, no SRF, retinal drusen .   Notes *Images captured and stored on drive  Diagnosis / Impression:  NFP, No IRF/SRF OU Retinal drusen OU  Clinical management:  See below  Abbreviations: NFP - Normal foveal profile. CME - cystoid macular edema. PED - pigment epithelial detachment. IRF - intraretinal fluid. SRF - subretinal fluid. EZ - ellipsoid zone. ERM - epiretinal membrane. ORA - outer retinal atrophy. ORT - outer retinal tubulation. SRHM - subretinal hyper-reflective material         Repair Retinal Breaks, Laser - OD - Right Eye       LASER PROCEDURE NOTE  Procedure:  Barrier laser retinopexy using slit lamp laser, RIGHT eye   Diagnosis:   Retinal tear, RIGHT eye                     Flap tear at 9 o'clock anterior to equator    Surgeon: Brian Zamora, MD, PhD  Anesthesia: Topical  Informed consent obtained, operative eye marked, and time out performed prior to initiation of laser.   Laser settings:  Lumenis Smart532 laser, slit lamp Lens: Mainster PRP 165 Power: 230 mW Spot size: 200 microns Duration: 40 msec  # spots: 149  Placement of laser: Using a Mainster PRP 165 contact lens at the slit lamp, laser was placed in three confluent rows around flap tear at 9 oclock anterior to equator. Laser indirect ophthalmoscopy was used to complete the anterior laser to ora: 354 spots; 260 mW power; 70 ms duration.  Complications: None.  Patient tolerated the procedure well and received written and verbal post-procedure care information/education.                  ASSESSMENT/PLAN:    ICD-10-CM   1. Retinal tear of right eye H33.311 Repair Retinal Breaks, Laser - OD - Right Eye  2. Retinal edema H35.81 OCT, Retina - OU - Both Eyes  3. Essential hypertension I10   4. Hypertensive retinopathy of both eyes H35.033   5. Primary open angle glaucoma of both eyes, unspecified glaucoma stage H40.1130   6. Pseudophakia of both eyes Z96.1     1. Retinal tear, OD   - The incidence, risk factors, and natural history of retinal tear was discussed with patient.   - Potential treatment options including laser retinopexy and cryotherapy discussed with patient. - small horseshoe flap tear at 0900 with surrounding pigment -- no SRF - recommend laser retinopexy OD today (10.01.19) - pt wishes to proceed - RBA   of procedure discussed, questions answered - informed consent obtained and signed - see procedure note - start PF QID OD x7 days - f/u in 10-14 days  2. No retinal edema on exam or OCT  3,4. Hypertensive retinopathy OU - discussed importance of tight BP control - monitor  5. POAG OU-  - IOP slightly elevated today - 22 OD, 16 OS - pt out of gtts x 4 days (usually on Lumigan and Combigan) - under the  expert management of Dr. N. Patel  6. Pseudophakia OU  - s/p CE/IOL OU in [place]  - beautiful surgeries, doing well  - monitor   Ophthalmic Meds Ordered this visit:  Meds ordered this encounter  Medications  . prednisoLONE acetate (PRED FORTE) 1 % ophthalmic suspension    Sig: Place 1 drop into the right eye 4 (four) times daily for 7 days.    Dispense:  10 mL    Refill:  0       Return in about 2 weeks (around 06/20/2018) for F/U laser ret OD, DFE.  There are no Patient Instructions on file for this visit.   Explained the diagnoses, plan, and follow up with the patient and they expressed understanding.  Patient expressed understanding of the importance of proper follow up care.   This document serves as a record of services personally performed by Brian G. Zamora, MD, PhD. It was created on their behalf by Amanda Brown, OA, an ophthalmic assistant. The creation of this record is the provider's dictation and/or activities during the visit.    Electronically signed by: Amanda Brown, OA  09.30.19 12:36 AM   This document serves as a record of services personally performed by Brian G. Zamora, MD, PhD. It was created on their behalf by Meredith Fabian, COA, a certified ophthalmic assistant. The creation of this record is the provider's dictation and/or activities during the visit.  Electronically signed by: Meredith Fabian, COA  10.01.19 12:36 AM   Brian G. Zamora, M.D., Ph.D. Diseases & Surgery of the Retina and Vitreous [facility]  I have reviewed the above documentation for accuracy and completeness, and I agree with the above. Brian G. Zamora, M.D., Ph.D. 06/07/18 12:36 AM    Abbreviations: M myopia (nearsighted); A astigmatism; H hyperopia (farsighted); P presbyopia; Mrx spectacle prescription;  CTL contact lenses; OD right eye; OS left eye; OU both eyes  XT exotropia; ET esotropia; PEK punctate epithelial keratitis; PEE punctate epithelial  erosions; DES dry eye syndrome; MGD meibomian gland dysfunction; ATs artificial tears; PFAT's preservative free artificial tears; NSC nuclear sclerotic cataract; PSC posterior subcapsular cataract; ERM epi-retinal membrane; PVD posterior vitreous detachment; RD retinal detachment; DM diabetes mellitus; DR diabetic retinopathy; NPDR non-proliferative diabetic retinopathy; PDR proliferative diabetic retinopathy; CSME clinically significant macular edema; DME diabetic macular edema; dbh dot blot hemorrhages; CWS cotton wool spot; POAG primary open angle glaucoma; C/D cup-to-disc ratio; HVF humphrey visual field; GVF goldmann visual field; OCT optical coherence tomography; IOP intraocular pressure; BRVO Branch retinal vein occlusion; CRVO central retinal vein occlusion; CRAO central retinal artery occlusion; BRAO branch retinal artery occlusion; RT retinal tear; SB scleral buckle; PPV pars plana vitrectomy; VH Vitreous hemorrhage; PRP panretinal laser photocoagulation; IVK intravitreal kenalog; VMT vitreomacular traction; MH Macular hole;  NVD neovascularization of the disc; NVE neovascularization elsewhere; AREDS age related eye disease study; ARMD age related macular degeneration; POAG primary open angle glaucoma; EBMD epithelial/anterior basement membrane dystrophy; ACIOL anterior chamber intraocular lens; IOL intraocular lens; PCIOL posterior   chamber intraocular lens; Phaco/IOL phacoemulsification with intraocular lens placement; [place] photorefractive keratectomy; LASIK laser assisted in situ keratomileusis; HTN hypertension; DM diabetes mellitus; COPD chronic obstructive pulmonary disease

## 2018-06-06 ENCOUNTER — Ambulatory Visit (INDEPENDENT_AMBULATORY_CARE_PROVIDER_SITE_OTHER): Payer: Medicare Other | Admitting: Ophthalmology

## 2018-06-06 ENCOUNTER — Encounter (INDEPENDENT_AMBULATORY_CARE_PROVIDER_SITE_OTHER): Payer: Self-pay | Admitting: Ophthalmology

## 2018-06-06 DIAGNOSIS — Z961 Presence of intraocular lens: Secondary | ICD-10-CM | POA: Diagnosis not present

## 2018-06-06 DIAGNOSIS — I10 Essential (primary) hypertension: Secondary | ICD-10-CM

## 2018-06-06 DIAGNOSIS — H35033 Hypertensive retinopathy, bilateral: Secondary | ICD-10-CM | POA: Diagnosis not present

## 2018-06-06 DIAGNOSIS — H401130 Primary open-angle glaucoma, bilateral, stage unspecified: Secondary | ICD-10-CM

## 2018-06-06 DIAGNOSIS — H3581 Retinal edema: Secondary | ICD-10-CM | POA: Diagnosis not present

## 2018-06-06 DIAGNOSIS — H33311 Horseshoe tear of retina without detachment, right eye: Secondary | ICD-10-CM

## 2018-06-06 MED ORDER — PREDNISOLONE ACETATE 1 % OP SUSP: 1.0000 [drp] | Freq: Four times a day (QID) | OPHTHALMIC | 0 refills | Status: AC

## 2018-06-07 ENCOUNTER — Encounter (INDEPENDENT_AMBULATORY_CARE_PROVIDER_SITE_OTHER): Payer: Self-pay | Admitting: Ophthalmology

## 2018-06-09 ENCOUNTER — Inpatient Hospital Stay: Payer: Medicare Other | Attending: Hematology

## 2018-06-09 DIAGNOSIS — J320 Chronic maxillary sinusitis: Secondary | ICD-10-CM | POA: Insufficient documentation

## 2018-06-09 DIAGNOSIS — I10 Essential (primary) hypertension: Secondary | ICD-10-CM | POA: Diagnosis not present

## 2018-06-09 DIAGNOSIS — G4730 Sleep apnea, unspecified: Secondary | ICD-10-CM | POA: Diagnosis not present

## 2018-06-09 DIAGNOSIS — Z79899 Other long term (current) drug therapy: Secondary | ICD-10-CM | POA: Diagnosis not present

## 2018-06-09 DIAGNOSIS — D472 Monoclonal gammopathy: Secondary | ICD-10-CM | POA: Insufficient documentation

## 2018-06-09 DIAGNOSIS — J069 Acute upper respiratory infection, unspecified: Secondary | ICD-10-CM | POA: Diagnosis not present

## 2018-06-09 DIAGNOSIS — I700 Atherosclerosis of aorta: Secondary | ICD-10-CM | POA: Diagnosis not present

## 2018-06-09 DIAGNOSIS — Z7901 Long term (current) use of anticoagulants: Secondary | ICD-10-CM | POA: Insufficient documentation

## 2018-06-09 DIAGNOSIS — Z8711 Personal history of peptic ulcer disease: Secondary | ICD-10-CM | POA: Diagnosis not present

## 2018-06-09 DIAGNOSIS — Z87442 Personal history of urinary calculi: Secondary | ICD-10-CM | POA: Diagnosis not present

## 2018-06-09 DIAGNOSIS — I4891 Unspecified atrial fibrillation: Secondary | ICD-10-CM | POA: Diagnosis not present

## 2018-06-09 DIAGNOSIS — K219 Gastro-esophageal reflux disease without esophagitis: Secondary | ICD-10-CM | POA: Insufficient documentation

## 2018-06-09 DIAGNOSIS — G629 Polyneuropathy, unspecified: Secondary | ICD-10-CM | POA: Insufficient documentation

## 2018-06-09 DIAGNOSIS — M129 Arthropathy, unspecified: Secondary | ICD-10-CM | POA: Diagnosis not present

## 2018-06-09 LAB — CBC WITH DIFFERENTIAL/PLATELET
Basophils Absolute: 0 10*3/uL (ref 0.0–0.1)
Basophils Relative: 0 %
EOS ABS: 0.1 10*3/uL (ref 0.0–0.5)
EOS PCT: 2 %
HCT: 47.9 % (ref 38.4–49.9)
Hemoglobin: 15.8 g/dL (ref 13.0–17.1)
LYMPHS ABS: 1.2 10*3/uL (ref 0.9–3.3)
LYMPHS PCT: 23 %
MCH: 30.1 pg (ref 27.2–33.4)
MCHC: 32.9 g/dL (ref 32.0–36.0)
MCV: 91.6 fL (ref 79.3–98.0)
MONO ABS: 0.7 10*3/uL (ref 0.1–0.9)
Monocytes Relative: 13 %
Neutro Abs: 3.3 10*3/uL (ref 1.5–6.5)
Neutrophils Relative %: 62 %
PLATELETS: 145 10*3/uL (ref 140–400)
RBC: 5.23 MIL/uL (ref 4.20–5.82)
RDW: 13.1 % (ref 11.0–14.6)
WBC: 5.4 10*3/uL (ref 4.0–10.3)

## 2018-06-09 LAB — CMP (CANCER CENTER ONLY)
ALT: 34 U/L (ref 0–44)
ANION GAP: 10 (ref 5–15)
AST: 34 U/L (ref 15–41)
Albumin: 4 g/dL (ref 3.5–5.0)
Alkaline Phosphatase: 80 U/L (ref 38–126)
BUN: 18 mg/dL (ref 8–23)
CHLORIDE: 107 mmol/L (ref 98–111)
CO2: 24 mmol/L (ref 22–32)
Calcium: 9.2 mg/dL (ref 8.9–10.3)
Creatinine: 0.82 mg/dL (ref 0.61–1.24)
Glucose, Bld: 116 mg/dL — ABNORMAL HIGH (ref 70–99)
POTASSIUM: 4.1 mmol/L (ref 3.5–5.1)
Sodium: 141 mmol/L (ref 135–145)
Total Bilirubin: 0.9 mg/dL (ref 0.3–1.2)
Total Protein: 7 g/dL (ref 6.5–8.1)

## 2018-06-12 LAB — MULTIPLE MYELOMA PANEL, SERUM
ALBUMIN/GLOB SERPL: 1.4 (ref 0.7–1.7)
ALPHA2 GLOB SERPL ELPH-MCNC: 0.7 g/dL (ref 0.4–1.0)
Albumin SerPl Elph-Mcnc: 3.7 g/dL (ref 2.9–4.4)
Alpha 1: 0.2 g/dL (ref 0.0–0.4)
B-Globulin SerPl Elph-Mcnc: 0.8 g/dL (ref 0.7–1.3)
GAMMA GLOB SERPL ELPH-MCNC: 0.9 g/dL (ref 0.4–1.8)
GLOBULIN, TOTAL: 2.7 g/dL (ref 2.2–3.9)
IGG (IMMUNOGLOBIN G), SERUM: 714 mg/dL (ref 700–1600)
IgA: 120 mg/dL (ref 61–437)
IgM (Immunoglobulin M), Srm: 243 mg/dL — ABNORMAL HIGH (ref 15–143)
Total Protein ELP: 6.4 g/dL (ref 6.0–8.5)

## 2018-06-12 LAB — KAPPA/LAMBDA LIGHT CHAINS
KAPPA FREE LGHT CHN: 184 mg/L — AB (ref 3.3–19.4)
Kappa, lambda light chain ratio: 9.68 — ABNORMAL HIGH (ref 0.26–1.65)
Lambda free light chains: 19 mg/L (ref 5.7–26.3)

## 2018-06-16 ENCOUNTER — Encounter: Payer: Self-pay | Admitting: Hematology

## 2018-06-16 ENCOUNTER — Telehealth: Payer: Self-pay

## 2018-06-16 ENCOUNTER — Inpatient Hospital Stay (HOSPITAL_BASED_OUTPATIENT_CLINIC_OR_DEPARTMENT_OTHER): Payer: Medicare Other | Admitting: Hematology

## 2018-06-16 VITALS — BP 148/78 | HR 65 | Temp 98.2°F | Resp 18 | Ht 70.0 in | Wt 261.2 lb

## 2018-06-16 DIAGNOSIS — J320 Chronic maxillary sinusitis: Secondary | ICD-10-CM

## 2018-06-16 DIAGNOSIS — I10 Essential (primary) hypertension: Secondary | ICD-10-CM

## 2018-06-16 DIAGNOSIS — Z7901 Long term (current) use of anticoagulants: Secondary | ICD-10-CM | POA: Diagnosis not present

## 2018-06-16 DIAGNOSIS — Z87442 Personal history of urinary calculi: Secondary | ICD-10-CM | POA: Diagnosis not present

## 2018-06-16 DIAGNOSIS — Z79899 Other long term (current) drug therapy: Secondary | ICD-10-CM

## 2018-06-16 DIAGNOSIS — D472 Monoclonal gammopathy: Secondary | ICD-10-CM | POA: Diagnosis not present

## 2018-06-16 DIAGNOSIS — J069 Acute upper respiratory infection, unspecified: Secondary | ICD-10-CM | POA: Diagnosis not present

## 2018-06-16 DIAGNOSIS — Z8711 Personal history of peptic ulcer disease: Secondary | ICD-10-CM

## 2018-06-16 DIAGNOSIS — I700 Atherosclerosis of aorta: Secondary | ICD-10-CM | POA: Diagnosis not present

## 2018-06-16 DIAGNOSIS — K219 Gastro-esophageal reflux disease without esophagitis: Secondary | ICD-10-CM

## 2018-06-16 DIAGNOSIS — G629 Polyneuropathy, unspecified: Secondary | ICD-10-CM | POA: Diagnosis not present

## 2018-06-16 DIAGNOSIS — I4891 Unspecified atrial fibrillation: Secondary | ICD-10-CM | POA: Diagnosis not present

## 2018-06-16 DIAGNOSIS — M129 Arthropathy, unspecified: Secondary | ICD-10-CM | POA: Diagnosis not present

## 2018-06-16 DIAGNOSIS — G4730 Sleep apnea, unspecified: Secondary | ICD-10-CM

## 2018-06-16 DIAGNOSIS — R768 Other specified abnormal immunological findings in serum: Secondary | ICD-10-CM

## 2018-06-16 NOTE — Telephone Encounter (Signed)
Prinited avs and calender of upcomming appointment. Per 10/11 los

## 2018-06-16 NOTE — Progress Notes (Signed)
HEMATOLOGY/ONCOLOGY CONSULTATION NOTE  Date of Service: 06/16/2018  Patient Care Team: Lujean Amel, MD as PCP - General (Family Medicine) Minus Breeding, MD as PCP - Cardiology (Cardiology)  CHIEF COMPLAINTS/PURPOSE OF CONSULTATION:  Elevated serum kappa light chains  HISTORY OF PRESENTING ILLNESS:   Glen Bolton is a wonderful 77 y.o. male who has been referred to [place] by my colleague Dr Grace Isaac for evaluation and management of Elevated serum kappa light chains. He is accompanied today by his wife. The pt reports that he is doing well overall.   The pt reports that he had no symptoms before his abnormal lab results prompting his initial visit with Dr Lebron Conners. He denies any concerns for inflammation besides an ear infection for the last 3 weeks and a viral infection as well. He is being followed by his PCP and has had a 20dB drop in his right ear threshold. He has had upper respiratory symptoms including a bad cough that hurt his abdomen which is coupled by a bruise at his lower abdomen. He denies any trauma to the area but does take Xarelto for his Afib. He has been treated with a z-pack and other antibiotics. He has had tubes put in his ear 3 times in the past but denies any ear pain.   He notes that he has arthritis in his knees but denies concern for rheumatoid arthritis.   Of note prior to the patient's visit today, pt has had PET/CT completed on 02/06/18 with results revealing 1. No hypermetabolic mass or adenopathy identified. 2. No evidence for hepatomegaly or splenomegaly. 3. Mild low level FDG uptake throughout the bone marrow is noted. Nonspecific and may be physiologic. 4.  Aortic Atherosclerosis (ICD10-I70.0). 5. Bilateral maxillary sinus disease.   Surgical pathology 02/03/18 revealed HYPERCELLULAR BONE MARROW FOR AGE WITH TRILINEAGE HEMATOPOIESIS. - A MINOR PLASMA CELL COMPONENT WITH KAPPA LIGHT CHAIN EXCESS. - A FEW SMALL LYMPHOID AGGREGATES PRESENT.   Most  recent lab results (02/03/18) of CBC w/ diff  is as follows: all values are WNL. UPEP Light chains 01/26/18 showed Total Protein ur/day at 156, Free kappa lt chains, ur at 93.20, Free lambda lt chains at 7.61, Free Kappa/Lambda ratio at 12.25.  MMP 01/24/18 showed all values WNL except for IgM at 244. M Protein not observed.   On review of systems, pt reports cough, ear infection, lower abdomen superficial bruising, and denies fevers, chills, night sweats, leg swelling, testicular pain or swelling, and any other symptoms.   Interval History:   Glen Bolton returns today for management and evaluation of his elevated serum free light chains. The patient's last visit with [place] was on 02/14/18. He is accompanied today by his wife and daughter. The pt reports that he is doing well overall.   The pt reports that he has not developed any new concerns, and notes that he has not developed any constitutional symptoms. The pt notes that his neuropathy has not worsened and adds that he just has to be more intentional with his movements.   The pt notes that he ha had ongoing ear and sinus infections and had tubes in his ears for the last 6-7 years. The pt notes no heart problems besides Afib and controlled HTN. He continues on Xarelto for his Afib.   Lab results (06/09/18) of CBC w/diff, CMP is as follows: all values are WNL except for Glucose at 116. 06/09/18 MMP revealed all values WNL except for IgM at 243 06/09/18 SFLC revealed all values  WNL except for Kappa at 184.0 and K:L ratio at 9.68  On review of systems, pt reports stable energy levels, stable neuropathy, and denies changes in urination, changes in bowel habits, bone pains, new back pains, pain along the spine, fevers, chills, night sweats, abdominal pains, leg swelling, and any other symptons.   MEDICAL HISTORY:  Past Medical History:  Diagnosis Date  . Arthritis   . Atrial fibrillation ([place])   . Dysrhythmia   . GERD (gastroesophageal reflux disease)    . Glaucoma   . Hard of hearing   . History of gastritis   . History of kidney stones   . History of stomach ulcers   . Hx of migraines    no migraines since he was in his 47's  . Hypertension   . Peripheral neuropathy 12/26/2017  . Sleep apnea    CPAP    SURGICAL HISTORY: Past Surgical History:  Procedure Laterality Date  . CATARACT EXTRACTION Bilateral 2014  . COLONOSCOPY    . ESOPHAGOGASTRODUODENOSCOPY    . EYE SURGERY    . INGUINAL HERNIA REPAIR Left    x 2  . Rotator cuff surgery Right 2013  . TOTAL HIP ARTHROPLASTY Left 10/27/2015   Procedure: TOTAL HIP ARTHROPLASTY ANTERIOR APPROACH;  Surgeon: Frederik Pear, MD;  Location: [facility];  Service: Orthopedics;  Laterality: Left;    SOCIAL HISTORY: Social History   Socioeconomic History  . Marital status: Married    Spouse name: Not on file  . Number of children: 2  . Years of education: Not on file  . Highest education level: Not on file  Occupational History  . Occupation: Golf [employer]  Social Needs  . Financial resource strain: Not on file  . Food insecurity:    Worry: Not on file    Inability: Not on file  . Transportation needs:    Medical: Not on file    Non-medical: Not on file  Tobacco Use  . Smoking status: Never Smoker  . Smokeless tobacco: Never Used  Substance and Sexual Activity  . Alcohol use: Yes    Alcohol/week: 1.0 standard drinks    Types: 1 Glasses of wine per week    Comment: daily  . Drug use: No  . Sexual activity: Not on file  Lifestyle  . Physical activity:    Days per week: Not on file    Minutes per session: Not on file  . Stress: Not on file  Relationships  . Social connections:    Talks on phone: Not on file    Gets together: Not on file    Attends religious service: Not on file    Active member of club or organization: Not on file    Attends meetings of clubs or organizations: Not on file    Relationship status: Not on file  . Intimate partner violence:    Fear of current  or ex partner: Not on file    Emotionally abused: Not on file    Physically abused: Not on file    Forced sexual activity: Not on file  Other Topics Concern  . Not on file  Social History Narrative   Lives with wife   Caffeine use: 2 cups coffee daily   [employer]   Right handed     FAMILY HISTORY: Family History  Problem Relation Age of Onset  . Hyperlipidemia Brother   . Stroke Sister 75    ALLERGIES:  has No Known Allergies.  MEDICATIONS:  Current Outpatient  Medications  Medication Sig Dispense Refill  . bimatoprost (LUMIGAN) 0.01 % SOLN Place 1 drop into both eyes at bedtime.     . metoprolol succinate (TOPROL-XL) 50 MG 24 hr tablet take 1 tablet by mouth once daily TAKE WITH OR IMMEDIATELY FOLLOWING A MEAL 90 tablet 3  . Multiple Vitamins-Minerals (MULTIVITAMIN & MINERAL PO) Take 1 tablet by mouth daily.    Marland Kitchen olmesartan (BENICAR) 20 MG tablet TK 1 T PO D  4  . Probiotic Product (PROBIOTIC PO) Take 1 tablet by mouth daily.     Alveda Reasons 20 MG TABS tablet TAKE 1 TABLET BY MOUTH ONCE DAILY WITH SUPPER 30 tablet 6   No current facility-administered medications for this visit.     REVIEW OF SYSTEMS:    A 10+ POINT REVIEW OF SYSTEMS WAS OBTAINED including neurology, dermatology, psychiatry, cardiac, respiratory, lymph, extremities, GI, GU, Musculoskeletal, constitutional, breasts, reproductive, HEENT.  All pertinent positives are noted in the HPI.  All others are negative.   PHYSICAL EXAMINATION: . Vitals:   06/16/18 1006  BP: (!) 148/78  Pulse: 65  Resp: 18  Temp: 98.2 F (36.8 C)  SpO2: 95%   Filed Weights   06/16/18 1006  Weight: 261 lb 3.2 oz (118.5 kg)   .Body mass index is 37.48 kg/m.  GENERAL:alert, in no acute distress and comfortable SKIN: no acute rashes, no significant lesions EYES: conjunctiva are pink and non-injected, sclera anicteric OROPHARYNX: MMM, no exudates, no oropharyngeal erythema or ulceration NECK: supple, no JVD LYMPH:  no palpable  lymphadenopathy in the cervical, axillary or inguinal regions LUNGS: clear to auscultation b/l with normal respiratory effort HEART: regular rate & rhythm ABDOMEN:  normoactive bowel sounds , non tender, not distended. No palpable hepatosplenomegaly.  Extremity: no pedal edema PSYCH: alert & oriented x 3 with fluent speech NEURO: no focal motor/sensory deficits   LABORATORY DATA:  I have reviewed the data as listed  . CBC Latest Ref Rng & Units 06/09/2018 02/03/2018 01/24/2018  WBC 4.0 - 10.3 K/uL 5.4 8.7 8.5  Hemoglobin 13.0 - 17.1 g/dL 15.8 13.9 15.1  Hematocrit 38.4 - 49.9 % 47.9 41.5 45.4  Platelets 140 - 400 K/uL 145 229 141    . CMP Latest Ref Rng & Units 06/09/2018 01/24/2018 12/26/2017  Glucose 70 - 99 mg/dL 116(H) 109 -  BUN 8 - 23 mg/dL 18 17 -  Creatinine 0.61 - 1.24 mg/dL 0.82 0.85 -  Sodium 135 - 145 mmol/L 141 139 -  Potassium 3.5 - 5.1 mmol/L 4.1 3.9 -  Chloride 98 - 111 mmol/L 107 106 -  CO2 22 - 32 mmol/L 24 26 -  Calcium 8.9 - 10.3 mg/dL 9.2 9.1 -  Total Protein 6.5 - 8.1 g/dL 7.0 7.0 6.7  Total Bilirubin 0.3 - 1.2 mg/dL 0.9 0.7 -  Alkaline Phos 38 - 126 U/L 80 81 -  AST 15 - 41 U/L 34 31 -  ALT 0 - 44 U/L 34 31 -   02/03/18 BM Report:    02/03/18 Flow Cytometry:   02/03/18 Cytogenetics:    RADIOGRAPHIC STUDIES: I have personally reviewed the radiological images as listed and agreed with the findings in the report. No results found.  ASSESSMENT & PLAN:   77 y.o. male with  1. Light chain MGUS  PET/CT from 02/06/18 did not reveal any bone lesions and revealed Mild low level FDG uptake throughout the bone marrow is noted.   PLAN: -Discussed patient's most recent labs from 02/03/18 and  01/24/18, Free kappa lt chains in the urine were elevated at 93.20. No anemia, no abnormal kidney functions. Nonspecific and may be physiologic.  -IgM increase noted without M-spike -Reviewed BM bx pathology which indicated 2% plasma cells -Discussed that the pt does not  meet CRAB criteria and no other evidence of overt myeloma., and his labs may suggest a reactive process -Pt will let me or his PCP know if he develops any constitutional symptoms or new, concerning symptoms in the interim -Follow up with PCP for age-appropriate cancer screening and symptom-directed work up -Continue follow up with neurology for management of neuropathy and Vitamin B12 deficiency -Discussed pt labwork from 06/09/18; blood counts and chemistries are stable. No CRAB criteria met. No M-spike. Stable IgM. Kappa:Lambda ratio improved to 9.68 over 4 months from 13.99.  -Will continue tracking kidney functions, calcium and anemia if any -Reactive to chronic inflammation with frequent sinus and ear infections ? -No new heart, kidney, or neuropathy concerns -Continue follow up with cardiology  -Will be happy to see the pt back in 6 months with labs, sooner if any new concerns    2. Superficial abdominal hematoma -Superficial bruising of lower abdomen should continue to resolve    RTC with Dr Irene Limbo in 6 months with labs    All of the patients questions were answered with apparent satisfaction. The patient knows to call the clinic with any problems, questions or concerns.  The total time spent in the appt was 25 minutes and more than 50% was on counseling and direct patient cares.     Glen Lone MD MS AAHIVMS [facility] Hematology/Oncology Physician [facility]  (Office):       [phone] (Work cell):  [phone] (Fax):           [phone]  06/16/2018 11:19 AM  I, Baldwin [place], am acting as a scribe for Dr. Irene Limbo  .I have reviewed the above documentation for accuracy and completeness, and I agree with the above. Glen Genera MD

## 2018-06-19 DIAGNOSIS — Z23 Encounter for immunization: Secondary | ICD-10-CM | POA: Diagnosis not present

## 2018-06-20 ENCOUNTER — Encounter (INDEPENDENT_AMBULATORY_CARE_PROVIDER_SITE_OTHER): Payer: Medicare Other | Admitting: Ophthalmology

## 2018-06-20 DIAGNOSIS — H401132 Primary open-angle glaucoma, bilateral, moderate stage: Secondary | ICD-10-CM | POA: Diagnosis not present

## 2018-06-20 NOTE — Progress Notes (Signed)
[facility] Note  06/21/2018     CHIEF COMPLAINT Patient presents for Post-op Follow-up   HISTORY OF PRESENT ILLNESS: Glen Bolton is a 77 y.o. male who presents to the clinic today for:   HPI    Post-op Follow-up    In right eye.  Discomfort includes none.  Negative for pain, itching, foreign body sensation, tearing, discharge and floaters.  Vision is stable.  I, the attending physician,  performed the HPI with the patient and updated documentation appropriately.          Comments    S/P laser retinopexy OD (10.01.19); Pt states OD VA is stable; Pt states he had slight discomfort right after laser but states it has since passed; Pt denies any floaters, denies flashes, denies wavy VA; Pt states he completed PF OD QID x 7 days; Pt reports using lumigan OU qd, and combigan OU BID as directed by Dr. Posey Bolton; Pt states he was seen by a glaucoma specialist yesterday (Dr. Cher Bolton), pt states he was "put on another drop, its like [place] but its cheaper, but I haven't picked it up yet";        Last edited by Glen Caffey, MD on 06/21/2018  8:40 AM. (History)      Referring physician: Lujean Amel, MD [address], [place] [address]  HISTORICAL INFORMATION:   Selected notes from the MEDICAL RECORD NUMBER Referred by Dr. Virgina Bolton for concern of retinal tear OD LEE: 09.25.19 Glen Bolton) [BCVA: OD: 20/20 OS: 20/25] Ocular Hx-POAG OU, blepharitis OU, pseudo OU, PCO OD Former pt of Dr. Venetia Bolton, but recently transferred to Dr. Posey Bolton PMH-arthritis, a fib, hx of migraines, neuropathy    CURRENT MEDICATIONS: Current Outpatient Medications (Ophthalmic Drugs)  Medication Sig  . bimatoprost (LUMIGAN) 0.01 % SOLN Place 1 drop into both eyes at bedtime.    No current facility-administered medications for this visit.  (Ophthalmic Drugs)   Current Outpatient Medications (Other)  Medication Sig  . metoprolol succinate (TOPROL-XL) 50  MG 24 hr tablet take 1 tablet by mouth once daily TAKE WITH OR IMMEDIATELY FOLLOWING A MEAL  . Multiple Vitamins-Minerals (MULTIVITAMIN & MINERAL PO) Take 1 tablet by mouth daily.  Marland Kitchen olmesartan (BENICAR) 20 MG tablet TK 1 T PO D  . Probiotic Product (PROBIOTIC PO) Take 1 tablet by mouth daily.   Glen Bolton 20 MG TABS tablet TAKE 1 TABLET BY MOUTH ONCE DAILY WITH SUPPER   No current facility-administered medications for this visit.  (Other)      REVIEW OF SYSTEMS: ROS    Positive for: Musculoskeletal, Cardiovascular, Eyes, Respiratory   Negative for: Constitutional, Gastrointestinal, Neurological, Skin, Genitourinary, HENT, Endocrine, Psychiatric, Allergic/Imm, Heme/Lymph   Last edited by Glen Bolton, COA on 06/21/2018  8:13 AM. (History)       ALLERGIES No Known Allergies  PAST MEDICAL HISTORY Past Medical History:  Diagnosis Date  . Arthritis   . Atrial fibrillation ([place])   . Dysrhythmia   . GERD (gastroesophageal reflux disease)   . Glaucoma   . Hard of hearing   . History of gastritis   . History of kidney stones   . History of stomach ulcers   . Hx of migraines    no migraines since he was in his 23's  . Hypertension   . Peripheral neuropathy 12/26/2017  . Sleep apnea    CPAP   Past Surgical History:  Procedure Laterality Date  . CATARACT EXTRACTION  Bilateral 2014  . COLONOSCOPY    . ESOPHAGOGASTRODUODENOSCOPY    . EYE SURGERY    . INGUINAL HERNIA REPAIR Left    x 2  . Rotator cuff surgery Right 2013  . TOTAL HIP ARTHROPLASTY Left 10/27/2015   Procedure: TOTAL HIP ARTHROPLASTY ANTERIOR APPROACH;  Surgeon: Glen Pear, MD;  Location: [employer];  Service: Orthopedics;  Laterality: Left;    FAMILY HISTORY Family History  Problem Relation Age of Onset  . Hyperlipidemia Brother   . Stroke Sister 81    SOCIAL HISTORY Social History   Tobacco Use  . Smoking status: Never Smoker  . Smokeless tobacco: Never Used  Substance Use Topics  . Alcohol use: Yes     Alcohol/week: 1.0 standard drinks    Types: 1 Glasses of wine per week    Comment: daily  . Drug use: No         OPHTHALMIC EXAM:  Base Eye Exam    Visual Acuity (Snellen - Linear)      Right Left   Dist [place] 20/20 20/20 -1   Dist ph [place] 20/20 20/20       Tonometry (Tonopen, 8:23 AM)      Right Left   Pressure 17 18       Pupils      Dark Light Shape React APD   Right 3 2 Round Brisk None   Left 3 2 Round Brisk None       Visual Fields (Counting fingers)      Left Right    Full Full       Extraocular Movement      Right Left    Full, Ortho Full, Ortho       Neuro/Psych    Oriented x3:  Yes   Mood/Affect:  Normal       Dilation    Both eyes:  1.0% Mydriacyl, 2.5% Phenylephrine @ 8:23 AM        Slit Lamp and Fundus Exam    Slit Lamp Exam      Right Left   Lids/Lashes Dermatochalasis - upper lid, Meibomian gland dysfunction Dermatochalasis - upper lid, Meibomian gland dysfunction   Conjunctiva/Sclera Mild nasal and temporal Pinguecula Mild temporal Pinguecula, corksrew vessels mostly nasal    Cornea Arcus, 1+ Punctate epithelial erosions, Debris in tear film Arcus, 1+ Punctate epithelial erosions, Debris in tear film   Anterior Chamber Deep and quiet Deep and quiet   Iris Round and dilated Round and dilated   Lens PC IOL in good position PC IOL in good position   Vitreous Vitreous syneresis Vitreous syneresis       Fundus Exam      Right Left   Disc Pink and Sharp, mild Pallor? Cupping, 2+ Pallor, very thin inf rim, mild Peripapillary atrophy   C/D Ratio 0.55 - narrow inf rim 0.8   Macula Flat, Blunted foveal reflex, RPE mottling and clumping, Drusen, No heme or edema Blunted foveal reflex, Drusen, RPE mottling and clumping, No heme or edema   Vessels Vascular attenuation Vascular attenuation, AV crossing changes   Periphery Attached, small Horseshoe flap tear at 0900 with surrounding pigment -- no SRF -- early laser changes (not fully pigmented),  Reticular degeneration Attached, inf cobblestoning, Reticular degeneration          IMAGING AND PROCEDURES  Imaging and Procedures for _0 @           ASSESSMENT/PLAN:    ICD-10-CM   1. Retinal tear of right  eye H33.311   2. Retinal edema H35.81   3. Essential hypertension I10   4. Hypertensive retinopathy of both eyes H35.033   5. Primary open angle glaucoma of both eyes, unspecified glaucoma stage H40.1130   6. Pseudophakia of both eyes Z96.1     1. Retinal tear, OD   - small horseshoe flap tear at 0900 with surrounding pigment -- no SRF - S/P laser retinopexy OD (10.01.19) -- good early laser changes surrounding tear -- not fully pigmented - finished PF QID OD x7 days - f/u 3 weeks -- possible touch up laser retinopexy  2. No retinal edema on exam or OCT  3,4. Hypertensive retinopathy OU - discussed importance of tight BP control - monitor  5. POAG OU-  - had glaucoma evaluation on 10.16.19 with Dr. Cher Bolton referred by Dr. Posey Bolton - IOP stable today - 17 OD, 18 OS - currently using lumigan OU qd and combigan OU BID - continue all gtts as directed by Dr. Cher Bolton and Dr. Posey Bolton - under the expert management of Dr. Sammie Bolton and Dr. Cher Bolton   6. Pseudophakia OU  - s/p CE/IOL OU in [place]  - beautiful surgeries, doing well  - monitor   Ophthalmic Meds Ordered this visit:  No orders of the defined types were placed in this encounter.      Return in about 3 weeks (around 07/12/2018) for F/U laser ret OD, DFE, OCT.  There are no Patient Instructions on file for this visit.   Explained the diagnoses, plan, and follow up with the patient and they expressed understanding.  Patient expressed understanding of the importance of proper follow up care.   This document serves as a record of services personally performed by Gardiner Sleeper, MD, PhD. It was created on their behalf by Catha Brow, [place], a certified ophthalmic assistant. The creation of this record  is the provider's dictation and/or activities during the visit.  Electronically signed by: Catha Brow, [place]  10.15.19 1:03 PM   Gardiner Sleeper, M.D., Ph.D. Diseases & Surgery of the Retina and Vitreous [facility]   I have reviewed the above documentation for accuracy and completeness, and I agree with the above. Gardiner Sleeper, M.D., Ph.D. 06/21/18 1:03 PM    Abbreviations: M myopia (nearsighted); A astigmatism; H hyperopia (farsighted); P presbyopia; Mrx spectacle prescription;  CTL contact lenses; OD right eye; OS left eye; OU both eyes  XT exotropia; ET esotropia; PEK punctate epithelial keratitis; PEE punctate epithelial erosions; DES dry eye syndrome; MGD meibomian gland dysfunction; ATs artificial tears; PFAT's preservative free artificial tears; [place] nuclear sclerotic cataract; PSC posterior subcapsular cataract; ERM epi-retinal membrane; PVD posterior vitreous detachment; RD retinal detachment; DM diabetes mellitus; DR diabetic retinopathy; NPDR non-proliferative diabetic retinopathy; PDR proliferative diabetic retinopathy; CSME clinically significant macular edema; DME diabetic macular edema; dbh dot blot hemorrhages; CWS cotton wool spot; POAG primary open angle glaucoma; C/D cup-to-disc ratio; HVF humphrey visual field; GVF goldmann visual field; OCT optical coherence tomography; IOP intraocular pressure; BRVO Branch retinal vein occlusion; CRVO central retinal vein occlusion; CRAO central retinal artery occlusion; BRAO branch retinal artery occlusion; RT retinal tear; SB scleral buckle; PPV pars plana vitrectomy; VH Vitreous hemorrhage; PRP panretinal laser photocoagulation; IVK intravitreal kenalog; VMT vitreomacular traction; MH Macular hole;  NVD neovascularization of the disc; NVE neovascularization elsewhere; AREDS age related eye disease study; ARMD age related macular degeneration; POAG primary open angle glaucoma; EBMD epithelial/anterior basement  membrane dystrophy; ACIOL anterior chamber  intraocular lens; IOL intraocular lens; PCIOL posterior chamber intraocular lens; Phaco/IOL phacoemulsification with intraocular lens placement; [place] photorefractive keratectomy; LASIK laser assisted in situ keratomileusis; HTN hypertension; DM diabetes mellitus; COPD chronic obstructive pulmonary disease

## 2018-06-21 ENCOUNTER — Ambulatory Visit (INDEPENDENT_AMBULATORY_CARE_PROVIDER_SITE_OTHER): Payer: Medicare Other | Admitting: Ophthalmology

## 2018-06-21 ENCOUNTER — Encounter (INDEPENDENT_AMBULATORY_CARE_PROVIDER_SITE_OTHER): Payer: Self-pay | Admitting: Ophthalmology

## 2018-06-21 DIAGNOSIS — H33311 Horseshoe tear of retina without detachment, right eye: Secondary | ICD-10-CM

## 2018-06-21 DIAGNOSIS — Z961 Presence of intraocular lens: Secondary | ICD-10-CM

## 2018-06-21 DIAGNOSIS — H3581 Retinal edema: Secondary | ICD-10-CM

## 2018-06-21 DIAGNOSIS — H35033 Hypertensive retinopathy, bilateral: Secondary | ICD-10-CM

## 2018-06-21 DIAGNOSIS — H401130 Primary open-angle glaucoma, bilateral, stage unspecified: Secondary | ICD-10-CM

## 2018-06-21 DIAGNOSIS — I10 Essential (primary) hypertension: Secondary | ICD-10-CM

## 2018-07-05 ENCOUNTER — Other Ambulatory Visit: Payer: Self-pay | Admitting: Cardiology

## 2018-07-05 NOTE — Telephone Encounter (Signed)
Rx request sent to pharmacy.  

## 2018-07-07 ENCOUNTER — Telehealth: Payer: Self-pay | Admitting: *Deleted

## 2018-07-07 NOTE — Telephone Encounter (Signed)
Patient contacted office, requested results of bone marrow biopsy from Feb 03, 2018 be faxed to him at [phone]. States he needs a copy for insurance. Document faxed as requested.

## 2018-07-10 NOTE — Progress Notes (Signed)
[facility] Note  07/12/2018     CHIEF COMPLAINT Patient presents for Retina Follow Up   HISTORY OF PRESENT ILLNESS: Glen Bolton is a 77 y.o. male who presents to the clinic today for:   HPI    Retina Follow Up    Patient presents with  Other.  In right eye.  This started 1 month ago.  Severity is mild.  Since onset it is stable.  I, the attending physician,  performed the HPI with the patient and updated documentation appropriately.          Comments    F/U Retina tear OD.S/PLaser retinpexy OD 06/06/18. Patient states his vision is good, eyes continues to be sensitivite to light. Denies new visual onsets.Pt is using CoSopt  Bid , Lumigan QD OU       Last edited by Bernarda Caffey, MD on 07/12/2018  8:43 AM. (History)    pt states he has not noticed any changes in vision since last time  Referring physician: Lujean Amel, MD [address], [place] [address]  HISTORICAL INFORMATION:   Selected notes from the MEDICAL RECORD NUMBER Referred by Dr. Virgina Evener for concern of retinal tear OD LEE: 09.25.19 Sammie Bench) [BCVA: OD: 20/20 OS: 20/25] Ocular Hx-POAG OU, blepharitis OU, pseudo OU, PCO OD Former pt of Dr. Venetia Maxon, but recently transferred to Dr. Posey Pronto PMH-arthritis, a fib, hx of migraines, neuropathy    CURRENT MEDICATIONS: Current Outpatient Medications (Ophthalmic Drugs)  Medication Sig  . bimatoprost (LUMIGAN) 0.01 % SOLN Place 1 drop into both eyes at bedtime.   . dorzolamide-timolol (COSOPT) 22.3-6.8 MG/ML ophthalmic solution INT 1 GTT IN OU BID   No current facility-administered medications for this visit.  (Ophthalmic Drugs)   Current Outpatient Medications (Other)  Medication Sig  . metoprolol succinate (TOPROL-XL) 50 MG 24 hr tablet take 1 tablet by mouth once daily TAKE WITH OR IMMEDIATELY FOLLOWING A MEAL  . Multiple Vitamins-Minerals (MULTIVITAMIN & MINERAL PO) Take 1 tablet by mouth daily.  Marland Kitchen  olmesartan (BENICAR) 20 MG tablet TK 1 T PO D  . Probiotic Product (PROBIOTIC PO) Take 1 tablet by mouth daily.   . rivaroxaban (XARELTO) 20 MG TABS tablet Take 1 tablet (20 mg total) by mouth daily with supper.   No current facility-administered medications for this visit.  (Other)      REVIEW OF SYSTEMS: ROS    Positive for: Eyes   Negative for: Constitutional, Gastrointestinal, Neurological, Skin, Genitourinary, Musculoskeletal, HENT, Endocrine, Cardiovascular, Respiratory, Psychiatric, Allergic/Imm, Heme/Lymph   Last edited by Zenovia Jordan, LPN on 63/0/1601  0:93 AM. (History)       ALLERGIES No Known Allergies  PAST MEDICAL HISTORY Past Medical History:  Diagnosis Date  . Arthritis   . Atrial fibrillation ([place])   . Dysrhythmia   . GERD (gastroesophageal reflux disease)   . Glaucoma   . Hard of hearing   . History of gastritis   . History of kidney stones   . History of stomach ulcers   . Hx of migraines    no migraines since he was in his 78's  . Hypertension   . Peripheral neuropathy 12/26/2017  . Sleep apnea    CPAP   Past Surgical History:  Procedure Laterality Date  . CATARACT EXTRACTION Bilateral 2014  . COLONOSCOPY    . ESOPHAGOGASTRODUODENOSCOPY    . EYE SURGERY    . INGUINAL HERNIA REPAIR Left    x 2  .  Rotator cuff surgery Right 2013  . TOTAL HIP ARTHROPLASTY Left 10/27/2015   Procedure: TOTAL HIP ARTHROPLASTY ANTERIOR APPROACH;  Surgeon: Frederik Pear, MD;  Location: [facility];  Service: Orthopedics;  Laterality: Left;    FAMILY HISTORY Family History  Problem Relation Age of Onset  . Hyperlipidemia Brother   . Stroke Sister 85    SOCIAL HISTORY Social History   Tobacco Use  . Smoking status: Never Smoker  . Smokeless tobacco: Never Used  Substance Use Topics  . Alcohol use: Yes    Alcohol/week: 1.0 standard drinks    Types: 1 Glasses of wine per week    Comment: daily  . Drug use: No         OPHTHALMIC EXAM:  Base Eye Exam     Visual Acuity (Snellen - Linear)      Right Left   Dist [place] 20/25 20/30 -1   Dist ph [place] 20/25 +2 20/25       Tonometry (Tonopen, 8:32 AM)      Right Left   Pressure 16 16       Pupils      Dark Light Shape React APD   Right 4 3 Round Brisk None   Left 4 3 Round Brisk None       Visual Fields (Counting fingers)      Left Right    Full        Extraocular Movement      Right Left    Ortho Ortho    -- -- --  --  --  -- -- --   -- -- --  --  --  -- -- --         Neuro/Psych    Oriented x3:  Yes   Mood/Affect:  Normal       Dilation    Both eyes:  1.0% Mydriacyl, 2.5% Phenylephrine @ 8:32 AM        Slit Lamp and Fundus Exam    Slit Lamp Exam      Right Left   Lids/Lashes Dermatochalasis - upper lid, Meibomian gland dysfunction Dermatochalasis - upper lid, Meibomian gland dysfunction   Conjunctiva/Sclera Mild nasal and temporal Pinguecula Mild temporal Pinguecula, corksrew vessels mostly nasal    Cornea Arcus, 1+ Punctate epithelial erosions, Debris in tear film Arcus, 1+ Punctate epithelial erosions, Debris in tear film   Anterior Chamber Deep and quiet Deep and quiet   Iris Round and moderately dilated to 29mm Round and dilated   Lens PC IOL in good position PC IOL in good position   Vitreous Vitreous syneresis, Posterior vitreous detachment Vitreous syneresis       Fundus Exam      Right Left   Disc Pink and Sharp, mild Pallor?, mild Tilted disc Cupping, 2+ Pallor, very thin inf rim, mild Peripapillary atrophy   C/D Ratio 0.55 - narrow inf rim 0.8   Macula Flat, Blunted foveal reflex, RPE mottling and clumping, Drusen, No heme or edema Blunted foveal reflex, Drusen, RPE mottling and clumping, No heme or edema   Vessels Vascular attenuation Vascular attenuation, AV crossing changes   Periphery Attached, small Horseshoe flap tear at 0900 with surrounding pigment -- no SRF -- good laser changes in place, Reticular degeneration Attached, inf cobblestoning,  Reticular degeneration          IMAGING AND PROCEDURES  Imaging and Procedures for @TODAY @           ASSESSMENT/PLAN:    ICD-10-CM  1. Retinal tear of right eye H33.311   2. Retinal edema H35.81 OCT, Retina - OU - Both Eyes  3. Early dry stage nonexudative age-related macular degeneration of both eyes H35.3131   4. Essential hypertension I10   5. Hypertensive retinopathy of both eyes H35.033   6. Primary open angle glaucoma of both eyes, unspecified glaucoma stage H40.1130   7. Pseudophakia of both eyes Z96.1     1. Retinal tear, OD   - small horseshoe flap tear at 0900 with surrounding pigment -- no SRF - S/P laser retinopexy OD (10.01.19) -- good laser changes surrounding tear - no new RT/RD - f/u 3 months   2. No retinal edema on exam or OCT  3. Age related macular degeneration, non-exudative, both eyes  - very mild drusen OU; no IRF/SRF  - The incidence, anatomy, and pathology of dry AMD, risk of progression, and the AREDS and AREDS 2 study including smoking risks discussed with patient.  - Recommend amsler grid monitoring  - monitor  4,5. Hypertensive retinopathy OU - discussed importance of tight BP control - monitor  6. POAG OU-  - had glaucoma evaluation on 10.16.19 with Dr. [name] referred by Dr. Posey Pronto - IOP stable today - 16 OU - currently using lumigan OU qd and combigan OU BID - continue all gtts as directed by Dr. [name] and Dr. Posey Pronto - under the expert management of Dr. Sammie Bench and Dr. [name]   7. Pseudophakia OU  - s/p CE/IOL OU in [place]  - beautiful surgeries, doing well  - monitor   Ophthalmic Meds Ordered this visit:  No orders of the defined types were placed in this encounter.      Return in about 3 months (around 10/12/2018) for Dilated Exam, OCT.  There are no Patient Instructions on file for this visit.   Explained the diagnoses, plan, and follow up with the patient and they expressed understanding.  Patient expressed  understanding of the importance of proper follow up care.   This document serves as a record of services personally performed by Gardiner Sleeper, MD, PhD. It was created on their behalf by Ernest Mallick, OA, an ophthalmic assistant. The creation of this record is the provider's dictation and/or activities during the visit.    Electronically signed by: Ernest Mallick, OA  11.04.19 12:52 PM    Gardiner Sleeper, M.D., Ph.D. Diseases & Surgery of the Retina and Vitreous [facility]   I have reviewed the above documentation for accuracy and completeness, and I agree with the above. Gardiner Sleeper, M.D., Ph.D. 07/12/18 12:54 PM    Abbreviations: M myopia (nearsighted); A astigmatism; H hyperopia (farsighted); P presbyopia; Mrx spectacle prescription;  CTL contact lenses; OD right eye; OS left eye; OU both eyes  XT exotropia; ET esotropia; PEK punctate epithelial keratitis; PEE punctate epithelial erosions; DES dry eye syndrome; MGD meibomian gland dysfunction; ATs artificial tears; PFAT's preservative free artificial tears; [place] nuclear sclerotic cataract; PSC posterior subcapsular cataract; ERM epi-retinal membrane; PVD posterior vitreous detachment; RD retinal detachment; DM diabetes mellitus; DR diabetic retinopathy; NPDR non-proliferative diabetic retinopathy; PDR proliferative diabetic retinopathy; CSME clinically significant macular edema; DME diabetic macular edema; dbh dot blot hemorrhages; CWS cotton wool spot; POAG primary open angle glaucoma; C/D cup-to-disc ratio; HVF humphrey visual field; GVF goldmann visual field; OCT optical coherence tomography; IOP intraocular pressure; BRVO Branch retinal vein occlusion; CRVO central retinal vein occlusion; CRAO central retinal artery occlusion; BRAO branch retinal artery  occlusion; RT retinal tear; SB scleral buckle; PPV pars plana vitrectomy; VH Vitreous hemorrhage; PRP panretinal laser photocoagulation; IVK intravitreal kenalog;  VMT vitreomacular traction; MH Macular hole;  NVD neovascularization of the disc; NVE neovascularization elsewhere; AREDS age related eye disease study; ARMD age related macular degeneration; POAG primary open angle glaucoma; EBMD epithelial/anterior basement membrane dystrophy; ACIOL anterior chamber intraocular lens; IOL intraocular lens; PCIOL posterior chamber intraocular lens; Phaco/IOL phacoemulsification with intraocular lens placement; [place] photorefractive keratectomy; LASIK laser assisted in situ keratomileusis; HTN hypertension; DM diabetes mellitus; COPD chronic obstructive pulmonary disease

## 2018-07-12 ENCOUNTER — Ambulatory Visit (INDEPENDENT_AMBULATORY_CARE_PROVIDER_SITE_OTHER): Payer: Medicare Other | Admitting: Ophthalmology

## 2018-07-12 ENCOUNTER — Encounter (INDEPENDENT_AMBULATORY_CARE_PROVIDER_SITE_OTHER): Payer: Self-pay | Admitting: Ophthalmology

## 2018-07-12 DIAGNOSIS — H353131 Nonexudative age-related macular degeneration, bilateral, early dry stage: Secondary | ICD-10-CM

## 2018-07-12 DIAGNOSIS — H401130 Primary open-angle glaucoma, bilateral, stage unspecified: Secondary | ICD-10-CM

## 2018-07-12 DIAGNOSIS — I10 Essential (primary) hypertension: Secondary | ICD-10-CM | POA: Diagnosis not present

## 2018-07-12 DIAGNOSIS — H33311 Horseshoe tear of retina without detachment, right eye: Secondary | ICD-10-CM | POA: Diagnosis not present

## 2018-07-12 DIAGNOSIS — Z961 Presence of intraocular lens: Secondary | ICD-10-CM | POA: Diagnosis not present

## 2018-07-12 DIAGNOSIS — H3581 Retinal edema: Secondary | ICD-10-CM | POA: Diagnosis not present

## 2018-07-12 DIAGNOSIS — H35033 Hypertensive retinopathy, bilateral: Secondary | ICD-10-CM | POA: Diagnosis not present

## 2018-08-02 DIAGNOSIS — M1711 Unilateral primary osteoarthritis, right knee: Secondary | ICD-10-CM | POA: Diagnosis not present

## 2018-08-02 DIAGNOSIS — M1712 Unilateral primary osteoarthritis, left knee: Secondary | ICD-10-CM | POA: Diagnosis not present

## 2018-08-08 ENCOUNTER — Telehealth: Payer: Self-pay | Admitting: Cardiology

## 2018-08-08 NOTE — Telephone Encounter (Signed)
New Message    *STAT* If patient is at the pharmacy, call can be transferred to refill team.   1. Which medications need to be refilled? (please list name of each medication and dose if known) rivaroxaban (XARELTO) 20 MG TABS tablet  2. Which pharmacy/location (including street and city if local pharmacy) is medication to be sent to? [id number] - [place], [place] - [address] AT [address]  3. Do they need a 30 day or 90 day supply? [place]

## 2018-08-09 MED ORDER — RIVAROXABAN 20 MG PO TABS: 20.0000 mg | ORAL_TABLET | Freq: Every day | ORAL | 1 refills | Status: DC

## 2018-08-14 ENCOUNTER — Other Ambulatory Visit: Payer: Self-pay

## 2018-08-15 DIAGNOSIS — H401132 Primary open-angle glaucoma, bilateral, moderate stage: Secondary | ICD-10-CM | POA: Diagnosis not present

## 2018-08-15 MED ORDER — RIVAROXABAN 20 MG PO TABS: ORAL_TABLET | ORAL | 0 refills | Status: DC

## 2018-10-10 ENCOUNTER — Other Ambulatory Visit: Payer: Self-pay | Admitting: Cardiology

## 2018-10-11 ENCOUNTER — Encounter (INDEPENDENT_AMBULATORY_CARE_PROVIDER_SITE_OTHER): Payer: Medicare Other | Admitting: Ophthalmology

## 2018-10-14 ENCOUNTER — Other Ambulatory Visit: Payer: Self-pay

## 2018-10-14 ENCOUNTER — Encounter (HOSPITAL_COMMUNITY): Payer: Self-pay | Admitting: Emergency Medicine

## 2018-10-14 ENCOUNTER — Emergency Department (HOSPITAL_COMMUNITY)
Admission: EM | Admit: 2018-10-14 | Discharge: 2018-10-14 | Disposition: A | Discharge status: Home / Self Care | Payer: Medicare Other | Attending: Emergency Medicine | Admitting: Emergency Medicine

## 2018-10-14 DIAGNOSIS — H1132 Conjunctival hemorrhage, left eye: Secondary | ICD-10-CM

## 2018-10-14 DIAGNOSIS — Z79899 Other long term (current) drug therapy: Secondary | ICD-10-CM | POA: Diagnosis not present

## 2018-10-14 MED ORDER — POLYMYXIN B-TRIMETHOPRIM 10000-0.1 UNIT/ML-% OP SOLN: 1.0000 [drp] | OPHTHALMIC | 0 refills | Status: AC

## 2018-10-14 MED ORDER — TETRACAINE HCL 0.5 % OP SOLN
1.0000 [drp] | Freq: Once | OPHTHALMIC | Status: AC
  Administered 2018-10-14: 1 [drp] via OPHTHALMIC
  Filled 2018-10-14: qty 4

## 2018-10-14 MED ORDER — FLUORESCEIN SODIUM 1 MG OP STRP
1.0000 | ORAL_STRIP | Freq: Once | OPHTHALMIC | Status: AC
  Administered 2018-10-14: 1 via OPHTHALMIC
  Filled 2018-10-14: qty 1

## 2018-10-14 NOTE — ED Provider Notes (Signed)
[facility] EMERGENCY DEPARTMENT Provider Note   CSN: [id number] Arrival date & time: 10/14/18  1144     History   Chief Complaint No chief complaint on file.   HPI Glen Bolton is a 78 y.o. male.  78 y.o male with a PMH of GERD, Afib currently on Xarelto, Glaucoma, HTN presents to the ED with a chief complaint of left eye redness x 4 days. Patient reports feeling irritation on the left eye which has worsen. He states "it feels like something its there", like a ache sensation. He is currently on daily drop for his glaucoma but reports skipping therapy due unsure he could use the drops on his eye.  He reports using Dorzolamide and bimatoprost daily. He is currently under the care of Dr. Coralyn Pear for his eye conditions. He does endorse a previous history of right retinal detachment. He denies any trauma, blurry vision, decrease in vision or pain with eye movement, no nausea or vomiting.      Past Medical History:  Diagnosis Date  . Arthritis   . Atrial fibrillation ([place])   . Dysrhythmia   . GERD (gastroesophageal reflux disease)   . Glaucoma   . Hard of hearing   . History of gastritis   . History of kidney stones   . History of stomach ulcers   . Hx of migraines    no migraines since he was in his 20's  . Hypertension   . Peripheral neuropathy 12/26/2017  . Sleep apnea    CPAP    Patient Active Problem List   Diagnosis Date Noted  . IgM lambda monoclonal gammopathy 01/25/2018  . Peripheral neuropathy 12/26/2017  . Primary osteoarthritis of left hip 10/25/2015  . Atrial fibrillation ([place]) 08/26/2015    Past Surgical History:  Procedure Laterality Date  . CATARACT EXTRACTION Bilateral 2014  . COLONOSCOPY    . ESOPHAGOGASTRODUODENOSCOPY    . EYE SURGERY    . INGUINAL HERNIA REPAIR Left    x 2  . Rotator cuff surgery Right 2013  . TOTAL HIP ARTHROPLASTY Left 10/27/2015   Procedure: TOTAL HIP ARTHROPLASTY ANTERIOR APPROACH;  Surgeon: Frederik Pear, MD;   Location: [facility];  Service: Orthopedics;  Laterality: Left;        Home Medications    Prior to Admission medications   Medication Sig Start Date End Date Taking? Authorizing Provider  bimatoprost (LUMIGAN) 0.01 % SOLN Place 1 drop into both eyes at bedtime.    Yes [provider]  dorzolamide-timolol (COSOPT) 22.3-6.8 MG/ML ophthalmic solution Place 1 drop into both eyes 2 (two) times daily.  06/20/18  Yes [provider]  metoprolol succinate (TOPROL-XL) 50 MG 24 hr tablet TAKE 1 TABLET BY MOUTH DAILY WITH OR IMMEDIATELY AFTER A MEAL Patient taking differently: Take 50 mg by mouth daily. WITH OR IMMEDIATELY AFTER A MEAL 10/10/18  Yes Minus Breeding, MD  Multiple Vitamins-Minerals (MULTIVITAMIN & MINERAL PO) Take 1 tablet by mouth daily.   Yes [provider]  olmesartan (BENICAR) 20 MG tablet Take 20 mg by mouth daily.  05/23/17  Yes [provider]  Probiotic Product (PROBIOTIC PO) Take 1 tablet by mouth daily.    Yes [provider]  rivaroxaban (XARELTO) 20 MG TABS tablet Take 1 tablet by mouth daily with supper.  Please schedule MD appt for further refills 08/15/18  Yes Troy Sine, MD  trimethoprim-polymyxin b (POLYTRIM) ophthalmic solution Place 1 drop into the left eye every 4 (four) hours for  5 days. 10/14/18 10/19/18  Janeece Fitting, PA-C    Family History Family History  Problem Relation Age of Onset  . Hyperlipidemia Brother   . Stroke Sister 66    Social History Social History   Tobacco Use  . Smoking status: Never Smoker  . Smokeless tobacco: Never Used  Substance Use Topics  . Alcohol use: Yes    Alcohol/week: 1.0 standard drinks    Types: 1 Glasses of wine per week    Comment: daily  . Drug use: No     Allergies   Patient has no known allergies.   Review of Systems Review of Systems  Constitutional: Negative for chills and fever.  HENT: Negative for ear pain and sore throat.   Eyes: Positive for redness.  Negative for pain, discharge, itching and visual disturbance.  Respiratory: Negative for cough and shortness of breath.   Cardiovascular: Negative for chest pain and palpitations.  Gastrointestinal: Negative for abdominal pain and vomiting.  Genitourinary: Negative for dysuria and hematuria.  Musculoskeletal: Negative for arthralgias and back pain.  Skin: Negative for color change and rash.  Neurological: Negative for seizures and syncope.  All other systems reviewed and are negative.    Physical Exam Updated Vital Signs BP (!) 159/92   Pulse (!) 56   Temp 98.1 F (36.7 C) (Oral)   Ht 5\' 10"  (1.778 m)   Wt 115.7 kg   SpO2 94%   BMI 36.59 kg/m   Physical Exam Vitals signs and nursing note reviewed.  Constitutional:      Appearance: He is well-developed.  HENT:     Head: Normocephalic and atraumatic.  Eyes:     General: No scleral icterus.    Conjunctiva/sclera:     Right eye: Right conjunctiva is not injected. No chemosis or hemorrhage.    Left eye: Left conjunctiva is injected. Chemosis and hemorrhage present.     Pupils: Pupils are equal, round, and reactive to light.     Right eye: Pupil is round and reactive.     Left eye: Pupil is round and reactive. Corneal abrasion and fluorescein uptake present.     Funduscopic exam:    Right eye: No hemorrhage or exudate.        Left eye: No exudate.     Slit lamp exam:    Right eye: No photophobia.     Left eye: Corneal ulcer present. No hyphema, hypopyon or photophobia.      Comments: Tonopen right= 21 Tonopen left = 15   Neck:     Musculoskeletal: Normal range of motion.  Cardiovascular:     Heart sounds: Normal heart sounds.  Pulmonary:     Effort: Pulmonary effort is normal.     Breath sounds: Normal breath sounds. No wheezing.  Chest:     Chest wall: No tenderness.  Abdominal:     General: Bowel sounds are normal. There is no distension.     Palpations: Abdomen is soft.     Tenderness: There is no abdominal  tenderness.  Musculoskeletal:        General: No tenderness or deformity.  Skin:    General: Skin is warm and dry.  Neurological:     Mental Status: He is alert and oriented to person, place, and time.         ED Treatments / Results  Labs (all labs ordered are listed, but only abnormal results are displayed) Labs Reviewed - No data to display  EKG None  Radiology  No results found.  Procedures Procedures (including critical care time)  Medications Ordered in ED Medications  tetracaine (PONTOCAINE) 0.5 % ophthalmic solution 1 drop (1 drop Right Eye Given 10/14/18 1315)  fluorescein ophthalmic strip 1 strip (1 strip Left Eye Given 10/14/18 1315)     Initial Impression / Assessment and Plan / ED Course  I have reviewed the triage vital signs and the nursing notes.  Pertinent labs & imaging results that were available during my care of the patient were reviewed by me and considered in my medical decision making (see chart for details).     With a previous history of right retinal detachment, presents to the ED with a chief complaint of left eye pain since Tuesday.  Patient is currently using drops for his previous eye conditions, does report glaucoma to both eyes.  Was scheduled to see an eye specialist this past Wednesday but had to reschedule due to scheduling conflicts.  Today he reports for a long left eye "I feel like something is there ", he has not been see his eyedrops as directed due to the hemorrhage in his left eye.  Seen at urgent care prior to arrival they had some concern for acute angle-closure glaucoma, denies any severe pain to the left eye more so discomfort, denies any previous nausea, vomiting or headache.  The scopic exam was reassuring, Tono-Pen pressures were 15 on left eye, 21 on the right eye.  He does have significant subconjunctival hemorrhage on the left, some uptake at 2:00 o'clock on the left eye.low suspicion for acute angle glaucoma at this  time. Will send patient to follow up with Dr. Coralyn Pear his ophthalmologist, who has been caring for him for his eye conditions.   Vitals stable during visit, patient not ill-appearing, not in acute distress.  Return precautions provided at length to patient and wife at the bedside. I have discussed patient with Dr. Darl Householder who has also seen patient and agrees with management.   Patient is to pause xarelto for the next 3 days.   Final Clinical Impressions(s) / ED Diagnoses   Final diagnoses:  Subconjunctival hemorrhage of left eye    ED Discharge Orders         Ordered    trimethoprim-polymyxin b (POLYTRIM) ophthalmic solution  Every 4 hours     10/14/18 1404           Janeece Fitting, PA-C 10/14/18 1459    Drenda Freeze, MD 10/16/18 [phone]

## 2018-10-14 NOTE — ED Notes (Signed)
Pts left eye is red and draining

## 2018-10-14 NOTE — Discharge Instructions (Addendum)
Please stop your Xarelto for the next 3 days. I have prescribed drops to help with your eye discomfort, please use as directed. Please call Dr. Coralyn Pear on Monday and have your appointment moved up to a closer date. If you experience any nausea, vomiting, severe headache please return to the emergency department.

## 2018-10-14 NOTE — ED Notes (Signed)
Tetracaine and fluorescein strip at bedside.  

## 2018-10-14 NOTE — ED Notes (Signed)
Patient verbalizes understanding of discharge instructions. Opportunity for questioning and answers were provided. Armband removed by staff, pt discharged from ED ambulatory to home.  

## 2018-10-14 NOTE — ED Triage Notes (Signed)
Pt presents to ED with complaints of left eye pain. This started on Tuesday. Pt states he feels something is in his eye

## 2018-10-14 NOTE — ED Notes (Signed)
tonopen and woodslamp at bedside

## 2018-10-23 NOTE — Progress Notes (Signed)
[facility] Note  10/24/2018     CHIEF COMPLAINT Patient presents for Retina Follow Up   HISTORY OF PRESENT ILLNESS: Glen Bolton is a 78 y.o. male who presents to the clinic today for:   HPI    Retina Follow Up    Patient presents with  Retinal Break/Detachment.  In right eye.  Severity is moderate.  Duration of 3 months.  Since onset it is stable.  I, the attending physician,  performed the HPI with the patient and updated documentation appropriately.          Comments    Patient here for 3 mo follow up for retinal tear (06-06-18). Pt states had a busted blood vessel od 2 weeks ago and went to the urgent care. Pt was told to go see an Ophthalmologist. Pt went to one in [facility]. Pt was told to stop taking xarelto for 3 days.       Last edited by Bernarda Caffey, MD on 10/24/2018 12:18 PM. (History)    pt states he had a popped a blood vessel in his left eye and went to the ED bc he wasn't sure what it was, he states it has cleared up now  Referring physician: Lujean Amel, MD [address], [place] [address]  HISTORICAL INFORMATION:   Selected notes from the [facility] Referred by Dr. Virgina Evener for concern of retinal tear OD LEE: 09.25.19 Sammie Bench) [BCVA: OD: 20/20 OS: 20/25] Ocular Hx-POAG OU, blepharitis OU, pseudo OU, PCO OD Former pt of Dr. Venetia Maxon, but recently transferred to Dr. Posey Pronto PMH-arthritis, a fib, hx of migraines, neuropathy    CURRENT MEDICATIONS: Current Outpatient Medications (Ophthalmic Drugs)  Medication Sig  . bimatoprost (LUMIGAN) 0.01 % SOLN Place 1 drop into both eyes at bedtime.   . dorzolamide-timolol (COSOPT) 22.3-6.8 MG/ML ophthalmic solution Place 1 drop into both eyes 2 (two) times daily.    No current facility-administered medications for this visit.  (Ophthalmic Drugs)   Current Outpatient Medications (Other)  Medication Sig  . metoprolol succinate (TOPROL-XL) 50 MG  24 hr tablet TAKE 1 TABLET BY MOUTH DAILY WITH OR IMMEDIATELY AFTER A MEAL (Patient taking differently: Take 50 mg by mouth daily. WITH OR IMMEDIATELY AFTER A MEAL)  . Multiple Vitamins-Minerals (MULTIVITAMIN & MINERAL PO) Take 1 tablet by mouth daily.  Marland Kitchen olmesartan (BENICAR) 20 MG tablet Take 20 mg by mouth daily.   . Probiotic Product (PROBIOTIC PO) Take 1 tablet by mouth daily.   . rivaroxaban (XARELTO) 20 MG TABS tablet Take 1 tablet by mouth daily with supper.  Please schedule MD appt for further refills   No current facility-administered medications for this visit.  (Other)      REVIEW OF SYSTEMS: ROS    Positive for: Eyes   Negative for: Constitutional, Gastrointestinal, Neurological, Skin, Genitourinary, Musculoskeletal, HENT, Endocrine, Cardiovascular, Respiratory, Psychiatric, Allergic/Imm, Heme/Lymph   Last edited by Elmore Guise on 10/24/2018  9:51 AM. (History)       ALLERGIES No Known Allergies  PAST MEDICAL HISTORY Past Medical History:  Diagnosis Date  . Arthritis   . Atrial fibrillation ([place])   . Dysrhythmia   . GERD (gastroesophageal reflux disease)   . Glaucoma   . Hard of hearing   . History of gastritis   . History of kidney stones   . History of stomach ulcers   . Hx of migraines    no migraines since he  was in his 99's  . Hypertension   . Peripheral neuropathy 12/26/2017  . Sleep apnea    CPAP   Past Surgical History:  Procedure Laterality Date  . CATARACT EXTRACTION Bilateral 2014  . COLONOSCOPY    . ESOPHAGOGASTRODUODENOSCOPY    . EYE SURGERY    . INGUINAL HERNIA REPAIR Left    x 2  . Rotator cuff surgery Right 2013  . TOTAL HIP ARTHROPLASTY Left 10/27/2015   Procedure: TOTAL HIP ARTHROPLASTY ANTERIOR APPROACH;  Surgeon: Frederik Pear, MD;  Location: [facility];  Service: Orthopedics;  Laterality: Left;    FAMILY HISTORY Family History  Problem Relation Age of Onset  . Hyperlipidemia Brother   . Stroke Sister 65    SOCIAL HISTORY Social  History   Tobacco Use  . Smoking status: Never Smoker  . Smokeless tobacco: Never Used  Substance Use Topics  . Alcohol use: Yes    Alcohol/week: 1.0 standard drinks    Types: 1 Glasses of wine per week    Comment: daily  . Drug use: No         OPHTHALMIC EXAM:  Base Eye Exam    Visual Acuity (Snellen - Linear)      Right Left   Dist [place] 20/25 20/30-2   Dist ph [place] 20/20-1 20/20-1       Tonometry (Tonopen, 9:52 AM)      Right Left   Pressure 17 17       Pupils      Dark Light Shape React APD   Right 2 1 Round Brisk None   Left 2 1 Round Brisk None       Visual Fields (Counting fingers)      Left Right    Full Full       Extraocular Movement      Right Left    Full, Ortho Full, Ortho       Neuro/Psych    Oriented x3:  Yes   Mood/Affect:  Normal       Dilation    Both eyes:  1.0% Mydriacyl, 2.5% Phenylephrine @ 9:52 AM        Slit Lamp and Fundus Exam    Slit Lamp Exam      Right Left   Lids/Lashes Dermatochalasis - upper lid, Meibomian gland dysfunction Dermatochalasis - upper lid, Meibomian gland dysfunction   Conjunctiva/Sclera Mild nasal and temporal Pinguecula Mild temporal Pinguecula, corksrew vessels mostly nasal    Cornea Arcus, 1+ Punctate epithelial erosions, Debris in tear film Arcus, 1+ Punctate epithelial erosions, Debris in tear film   Anterior Chamber Deep and quiet Deep and quiet   Iris Round and moderately dilated to 3mm Round and dilated   Lens PC IOL in good position PC IOL in good position   Vitreous Vitreous syneresis, Posterior vitreous detachment Vitreous syneresis       Fundus Exam      Right Left   Disc Pink and Sharp, mild Pallor?, mild Tilted disc Cupping, 2+ Pallor, very thin inf rim, mild Peripapillary atrophy   C/D Ratio 0.55 - narrow inf rim 0.8   Macula Flat, Blunted foveal reflex, RPE mottling and clumping, Drusen, No heme or edema Blunted foveal reflex, Drusen, RPE mottling and clumping, No heme or edema   Vessels  Vascular attenuation Vascular attenuation, AV crossing changes   Periphery Attached, small Horseshoe flap tear at 0900 with surrounding pigment -- no SRF -- good laser changes in place, Reticular degeneration Attached, inf cobblestoning, Reticular  degeneration          IMAGING AND PROCEDURES  Imaging and Procedures for @TODAY @  OCT, Retina - OU - Both Eyes       Right Eye Quality was good. Central Foveal Thickness: 278. Progression has been stable. Findings include normal foveal contour, no IRF, no SRF, retinal drusen .   Left Eye Quality was good. Central Foveal Thickness: 272. Progression has been stable. Findings include normal foveal contour, no IRF, no SRF, retinal drusen .   Notes *Images captured and stored on drive  Diagnosis / Impression:  NFP, No IRF/SRF OU Retinal drusen OU -- very early non-exudative ARMD OU  Clinical management:  See below  Abbreviations: NFP - Normal foveal profile. CME - cystoid macular edema. PED - pigment epithelial detachment. IRF - intraretinal fluid. SRF - subretinal fluid. EZ - ellipsoid zone. ERM - epiretinal membrane. ORA - outer retinal atrophy. ORT - outer retinal tubulation. SRHM - subretinal hyper-reflective material                  ASSESSMENT/PLAN:    ICD-10-CM   1. Retinal tear of right eye H33.311   2. Retinal edema H35.81 OCT, Retina - OU - Both Eyes  3. Early dry stage nonexudative age-related macular degeneration of both eyes H35.3131   4. Essential hypertension I10   5. Hypertensive retinopathy of both eyes H35.033   6. Primary open angle glaucoma of both eyes, unspecified glaucoma stage H40.1130   7. Pseudophakia of both eyes Z96.1     1. Retinal tear, OD   - small horseshoe flap tear at 0900 with surrounding pigment -- no SRF - S/P laser retinopexy OD (10.01.19) -- good laser changes surrounding tear - no new RT/RD - f/u PRN  2. No retinal edema on exam or OCT  3. Age related macular degeneration,  non-exudative, both eyes  - very mild drusen OU; no IRF/SRF  - The incidence, anatomy, and pathology of dry AMD, risk of progression, and the AREDS and AREDS 2 study including smoking risks discussed with patient.  - Recommend amsler grid monitoring  - monitor  - f/u 1 year  4,5. Hypertensive retinopathy OU - discussed importance of tight BP control - monitor  6. POAG OU-  - had glaucoma evaluation on 10.16.19 with Dr. [name] referred by Dr. Posey Pronto - IOP stable today - 16 OU - currently using lumigan OU qd and combigan OU BID - continue all gtts as directed by Dr. [name] and Dr. Posey Pronto - under the expert management of Dr. Sammie Bench and Dr. [name]   7. Pseudophakia OU  - s/p CE/IOL OU in [place]  - beautiful surgeries, doing well  - monitor   Ophthalmic Meds Ordered this visit:  No orders of the defined types were placed in this encounter.      Return in about 1 year (around 10/25/2019) for f/u non-exu ARMD OU, DFE, OCT.  There are no Patient Instructions on file for this visit.   Explained the diagnoses, plan, and follow up with the patient and they expressed understanding.  Patient expressed understanding of the importance of proper follow up care.   This document serves as a record of services personally performed by Gardiner Sleeper, MD, PhD. It was created on their behalf by Ernest Mallick, OA, an ophthalmic assistant. The creation of this record is the provider's dictation and/or activities during the visit.    Electronically signed by: Ernest Mallick, OA  02.17.2020 11:35 AM  Gardiner Sleeper, M.D., Ph.D. Diseases & Surgery of the Retina and Vitreous [facility]  I have reviewed the above documentation for accuracy and completeness, and I agree with the above. Gardiner Sleeper, M.D., Ph.D. 10/25/18 11:36 AM    Abbreviations: M myopia (nearsighted); A astigmatism; H hyperopia (farsighted); P presbyopia; Mrx spectacle prescription;  CTL  contact lenses; OD right eye; OS left eye; OU both eyes  XT exotropia; ET esotropia; PEK punctate epithelial keratitis; PEE punctate epithelial erosions; DES dry eye syndrome; MGD meibomian gland dysfunction; ATs artificial tears; PFAT's preservative free artificial tears; [place] nuclear sclerotic cataract; PSC posterior subcapsular cataract; ERM epi-retinal membrane; PVD posterior vitreous detachment; RD retinal detachment; DM diabetes mellitus; DR diabetic retinopathy; NPDR non-proliferative diabetic retinopathy; PDR proliferative diabetic retinopathy; CSME clinically significant macular edema; DME diabetic macular edema; dbh dot blot hemorrhages; CWS cotton wool spot; POAG primary open angle glaucoma; C/D cup-to-disc ratio; HVF humphrey visual field; GVF goldmann visual field; OCT optical coherence tomography; IOP intraocular pressure; BRVO Branch retinal vein occlusion; CRVO central retinal vein occlusion; CRAO central retinal artery occlusion; BRAO branch retinal artery occlusion; RT retinal tear; SB scleral buckle; PPV pars plana vitrectomy; VH Vitreous hemorrhage; PRP panretinal laser photocoagulation; IVK intravitreal kenalog; VMT vitreomacular traction; MH Macular hole;  NVD neovascularization of the disc; NVE neovascularization elsewhere; AREDS age related eye disease study; ARMD age related macular degeneration; POAG primary open angle glaucoma; EBMD epithelial/anterior basement membrane dystrophy; ACIOL anterior chamber intraocular lens; IOL intraocular lens; PCIOL posterior chamber intraocular lens; Phaco/IOL phacoemulsification with intraocular lens placement; [place] photorefractive keratectomy; LASIK laser assisted in situ keratomileusis; HTN hypertension; DM diabetes mellitus; COPD chronic obstructive pulmonary disease

## 2018-10-24 ENCOUNTER — Encounter (INDEPENDENT_AMBULATORY_CARE_PROVIDER_SITE_OTHER): Payer: Self-pay | Admitting: Ophthalmology

## 2018-10-24 ENCOUNTER — Ambulatory Visit (INDEPENDENT_AMBULATORY_CARE_PROVIDER_SITE_OTHER): Payer: Medicare Other | Admitting: Ophthalmology

## 2018-10-24 DIAGNOSIS — H33311 Horseshoe tear of retina without detachment, right eye: Secondary | ICD-10-CM | POA: Diagnosis not present

## 2018-10-24 DIAGNOSIS — H353131 Nonexudative age-related macular degeneration, bilateral, early dry stage: Secondary | ICD-10-CM

## 2018-10-24 DIAGNOSIS — H401130 Primary open-angle glaucoma, bilateral, stage unspecified: Secondary | ICD-10-CM

## 2018-10-24 DIAGNOSIS — H35033 Hypertensive retinopathy, bilateral: Secondary | ICD-10-CM

## 2018-10-24 DIAGNOSIS — H3581 Retinal edema: Secondary | ICD-10-CM

## 2018-10-24 DIAGNOSIS — I10 Essential (primary) hypertension: Secondary | ICD-10-CM | POA: Diagnosis not present

## 2018-10-24 DIAGNOSIS — Z961 Presence of intraocular lens: Secondary | ICD-10-CM

## 2018-10-25 ENCOUNTER — Encounter (INDEPENDENT_AMBULATORY_CARE_PROVIDER_SITE_OTHER): Payer: Self-pay | Admitting: Ophthalmology

## 2018-11-13 ENCOUNTER — Other Ambulatory Visit: Payer: Self-pay | Admitting: Cardiovascular Disease

## 2018-11-13 NOTE — Telephone Encounter (Signed)
°*  STAT* If patient is at the pharmacy, call can be transferred to refill team.   1. Which medications need to be refilled? (please list name of each medication and dose if known) Xarelto- need enough until his appt on 12-04-18  2. Which pharmacy/location (including street and city if local pharmacy) is medication to be sent to? [place], [place]  3. Do they need a 30 day or 90 day supply? Enough until 12-04-18

## 2018-12-01 ENCOUNTER — Telehealth: Payer: Self-pay

## 2018-12-01 NOTE — Telephone Encounter (Signed)
SPOKE WITH PATIENT YESTERDAY 11/30/2018. Informed patient that we are changing all our appointments to virtual Evisits. Patient agreeable with change. Lurena Joiner has already reviewed patients chart. Patient states he does have a smart phone. Patient requested to keep his same appointment time. Advised patient that I would contact him fifteen miutes prior to get his vital sign, review meds and allergies. Patient voiced understanding and agreeable to all information provided.   Today I sent patient a mychart message with info as to how to accept treatment for his Evisit.

## 2018-12-04 ENCOUNTER — Telehealth (INDEPENDENT_AMBULATORY_CARE_PROVIDER_SITE_OTHER): Payer: Medicare Other | Admitting: Cardiology

## 2018-12-04 ENCOUNTER — Encounter: Payer: Self-pay | Admitting: Cardiology

## 2018-12-04 ENCOUNTER — Telehealth: Payer: Self-pay | Admitting: Cardiology

## 2018-12-04 VITALS — BP 159/107 | HR 80 | Temp 97.0°F | Ht 70.0 in | Wt 259.0 lb

## 2018-12-04 DIAGNOSIS — G4730 Sleep apnea, unspecified: Secondary | ICD-10-CM | POA: Insufficient documentation

## 2018-12-04 DIAGNOSIS — I482 Chronic atrial fibrillation, unspecified: Secondary | ICD-10-CM

## 2018-12-04 DIAGNOSIS — G603 Idiopathic progressive neuropathy: Secondary | ICD-10-CM

## 2018-12-04 DIAGNOSIS — Z7901 Long term (current) use of anticoagulants: Secondary | ICD-10-CM | POA: Insufficient documentation

## 2018-12-04 DIAGNOSIS — G4733 Obstructive sleep apnea (adult) (pediatric): Secondary | ICD-10-CM

## 2018-12-04 DIAGNOSIS — I10 Essential (primary) hypertension: Secondary | ICD-10-CM | POA: Insufficient documentation

## 2018-12-04 DIAGNOSIS — D472 Monoclonal gammopathy: Secondary | ICD-10-CM

## 2018-12-04 HISTORY — DX: Long term (current) use of anticoagulants: Z79.01

## 2018-12-04 NOTE — Progress Notes (Signed)
Virtual Visit via Telephone Note    Evaluation Performed:  Follow-up visit  This visit type was conducted due to national recommendations for restrictions regarding the COVID-19 Pandemic (e.g. social distancing).  This format is felt to be most appropriate for this patient at this time.  All issues noted in this document were discussed and addressed.  No physical exam was performed (except for noted visual exam findings with Video Visits).  Please refer to the patient's chart (MyChart message for video visits and phone note for telephone visits) for the patient's consent to telehealth for [facility].  Date:  12/04/2018   ID:  Glen Bolton, DOB Feb 25, 1941, MRN [id number]  Patient Location: Home [address] [address]   Provider location:   [facility] Office  PCP:  Lujean Amel, MD  Cardiologist:  Minus Breeding, MD  Electrophysiologist:  None   Chief Complaint:  Routine follow up  History of Present Illness:    Glen Bolton is a 78 y.o. male who presents via audio/video conferencing for a telehealth visit today.  He is a pleasant 78 year old male with a history of CAF.  In reviewing his past EKGs it appears he is in chronic atrial fibrillation with controlled ventricular response.  His last EKG was in September 2018.  Echocardiogram done in 2017 showed a normal ejection fraction of 55 to 60% with moderate left atrial enlargement.  Since we saw him last he has been diagnosed with IgM monoclonal gammopathy with resultant peripheral neuropathy.  He has been evaluated by Dr. Jannifer Franklin and Dr. Irene Limbo.  He has a mild gait disorder, he has not had any falls.  On interview today he says he has been doing well.  Denies any sustained tachycardia but he has had occasional palpitations.  He denies any unusual shortness of breath, fever, chills, or exposure to CO twice daily-19.  His blood pressure was elevated this morning, 159/106.  His temperature was 97 F.  He says he saw  his primary care provider a couple weeks ago and his blood pressure was better at that time.  The patient does not symptoms concerning for COVID-19 infection (fever, chills, cough, or new SHORTNESS OF BREATH).    Prior CV studies:   The following studies were reviewed today:  EKG 06/03/2017- AF with VR-72- one PVC  Echo 2017- Study Conclusions  - Left ventricle: The cavity size was normal. Wall thickness was   normal. Systolic function was normal. The estimated ejection   fraction was in the range of 55% to 60%. Wall motion was normal;   there were no regional wall motion abnormalities. The study is   not technically sufficient to allow evaluation of LV diastolic   function. - Aortic valve: Trileaflet; mildly thickened, mildly calcified   leaflets. - Left atrium: The atrium was moderately dilated.   Anterior-posterior dimension: 51 mm. Volume/bsa, ES, (1-plane   Simpson&'s, A2C): 46.3 ml/m^2.  Past Medical History:  Diagnosis Date   Arthritis    Atrial fibrillation (H[facility])    Dysrhythmia    GERD (gastroesophageal reflux disease)    Glaucoma    Hard of hearing    History of gastritis    History of kidney stones    History of stomach ulcers    Hx of migraines    no migraines since he was in his 30's   Hypertension    Peripheral neuropathy 12/26/2017   Sleep apnea    CPAP   Past Surgical History:  Procedure  Laterality Date   CATARACT EXTRACTION Bilateral 2014   COLONOSCOPY     ESOPHAGOGASTRODUODENOSCOPY     EYE SURGERY     INGUINAL HERNIA REPAIR Left    x 2   Rotator cuff surgery Right 2013   TOTAL HIP ARTHROPLASTY Left 10/27/2015   Procedure: TOTAL HIP ARTHROPLASTY ANTERIOR APPROACH;  Surgeon: Frederik Pear, MD;  Location: [facility];  Service: Orthopedics;  Laterality: Left;     Current Meds  Medication Sig   bimatoprost (LUMIGAN) 0.01 % SOLN Place 1 drop into both eyes at bedtime.    dorzolamide-timolol (COSOPT) 22.3-6.8 MG/ML ophthalmic solution  Place 1 drop into both eyes 2 (two) times daily.    metoprolol succinate (TOPROL-XL) 50 MG 24 hr tablet TAKE 1 TABLET BY MOUTH DAILY WITH OR IMMEDIATELY AFTER A MEAL (Patient taking differently: Take 50 mg by mouth daily. WITH OR IMMEDIATELY AFTER A MEAL)   Multiple Vitamins-Minerals (MULTIVITAMIN & MINERAL PO) Take 1 tablet by mouth daily.   olmesartan (BENICAR) 20 MG tablet Take 20 mg by mouth daily.    Probiotic Product (PROBIOTIC PO) Take 1 tablet by mouth daily.    XARELTO 20 MG TABS tablet TAKE 1 TABLET BY MOUTH WITH SUPPER. PLEASE SCHEDULE MD APPT FOR FURTHER REFILLS     Allergies:   Patient has no known allergies.   Social History   Tobacco Use   Smoking status: Never Smoker   Smokeless tobacco: Never Used  Substance Use Topics   Alcohol use: Yes    Alcohol/week: 1.0 standard drinks    Types: 1 Glasses of wine per week    Comment: daily   Drug use: No     Family Hx: The patient's family history includes Hyperlipidemia in his brother; Stroke (age of onset: 4) in his sister.  ROS:   Please see the history of present illness.    He has macular degeneration described as mild. He has DJD in his knees and has had prior LTHR. All other systems reviewed and are negative.   Labs/Other Tests and Data Reviewed:    Recent Labs: 06/09/2018: ALT 34; BUN 18; Creatinine 0.82; Hemoglobin 15.8; Platelets 145; Potassium 4.1; Sodium 141   Recent Lipid Panel No results found for: CHOL, TRIG, HDL, CHOLHDL, LDLCALC, LDLDIRECT  Wt Readings from Last 3 Encounters:  12/04/18 259 lb (117.5 kg)  10/14/18 255 lb (115.7 kg)  06/16/18 261 lb 3.2 oz (118.5 kg)     Exam:    Vital Signs:  BP (!) 159/107    Pulse 80    Temp (!) 97 F (36.1 C)    Ht _0  (1.778 m)    Wt 259 lb (117.5 kg)    BMI 37.16 kg/m     ASSESSMENT & PLAN:    CAF- Rate controlled, no history of sustained tachycardia  Essential HTN B/P was up this am.  He tells me this is unusual for him.  I have asked  him to monitor this.  Chronic anticoagulation CHADS VASC-3, on Xarelto  IgM monoclonal gammopathy Bone marrow biopsy, PET scan May 2019- Dr Irene Limbo.  No therapy monitoring  Peripheral neuropathy Followed by Dr Jannifer Franklin- gait disorder, no falls  Plan-I have asked the patient to monitor his blood pressure at home.  He is to check his pressure 3 times a week at different times of the day.  After 2 weeks he will report to his primary care provider who follows him closely.  His goal blood pressure is 120/80 or less.  He knows to contact us if he has any trouble with unusual dyspnea or tachycardia.  I will arrange for a follow-up visit with Dr. Percival Spanish in 3 months.    COVID-19 Education: The signs and symptoms of COVID-19 were discussed with the patient and how to seek care for testing (follow up with PCP or arrange E-visit).  The importance of social distancing was discussed today.  Patient Risk:   After full review of this patients clinical status, I feel that they are at least moderate risk at this time.  Time:   Today, I have spent 20 minutes with the patient with telehealth technology discussing his B/P and AF.     Medication Adjustments/Labs and Tests Ordered: Current medicines are reviewed at length with the patient today.  Concerns regarding medicines are outlined above.   Tests Ordered: No orders of the defined types were placed in this encounter.  Medication Changes: No orders of the defined types were placed in this encounter.   Disposition:  in 3 month(s) with Dr Percival Spanish.  The patient will contact his PCP after two weeks with his B/P log.   Signed, Kerin Ransom, PA-C  12/04/2018 9:44 AM     [facility]

## 2018-12-04 NOTE — Telephone Encounter (Signed)
New Message   Pt is calling because he missed his Virtual Visit appointment and is returning the call   Please call back

## 2018-12-04 NOTE — Telephone Encounter (Signed)
SPOKE WITH PATIENT AND HE COMPLETED HIS EVISIT.

## 2018-12-04 NOTE — Patient Instructions (Addendum)
Called patient at 7:53am. To start evisit. Patient did not answer. TY, CMA 7:54am 12/04/2018.  Medication Instructions:  Your physician recommends that you continue on your current medications as directed. Please refer to the Current Medication list given to you today. If you need a refill on your cardiac medications before your next appointment, please call your pharmacy.   Lab work: None  If you have labs (blood work) drawn today and your tests are completely normal, you will receive your results only by: Marland Kitchen MyChart Message (if you have MyChart) OR . A paper copy in the mail If you have any lab test that is abnormal or we need to change your treatment, we will call you to review the results.  Testing/Procedures: None   Follow-Up: At [facility], you and your health needs are our priority.  As part of our continuing mission to provide you with exceptional heart care, we have created designated Provider Care Teams.  These Care Teams include your primary Cardiologist (physician) and Advanced Practice Providers (APPs -  Physician Assistants and Nurse Practitioners) who all work together to provide you with the care you need, when you need it. You will need a follow up appointment in 3-4 months.  Please call our office 2 months in advance to schedule this appointment.  You may see Minus Breeding, MD or one of the following Advanced Practice Providers on your designated Care Team:   Rosaria Ferries, PA-C . Jory Sims, DNP, ANP  Any Other Special Instructions Will Be Listed Below (If Applicable). Please check your blood pressure Mondays, Wednesdays and Fridays for 2 weeks then contact your pcp with your readings.

## 2018-12-08 ENCOUNTER — Inpatient Hospital Stay: Payer: Medicare Other | Attending: Hematology

## 2018-12-08 ENCOUNTER — Other Ambulatory Visit: Payer: Self-pay

## 2018-12-08 DIAGNOSIS — Z8711 Personal history of peptic ulcer disease: Secondary | ICD-10-CM | POA: Diagnosis not present

## 2018-12-08 DIAGNOSIS — R768 Other specified abnormal immunological findings in serum: Secondary | ICD-10-CM | POA: Insufficient documentation

## 2018-12-08 DIAGNOSIS — I10 Essential (primary) hypertension: Secondary | ICD-10-CM | POA: Diagnosis not present

## 2018-12-08 DIAGNOSIS — K219 Gastro-esophageal reflux disease without esophagitis: Secondary | ICD-10-CM | POA: Diagnosis not present

## 2018-12-08 DIAGNOSIS — Z79899 Other long term (current) drug therapy: Secondary | ICD-10-CM | POA: Diagnosis not present

## 2018-12-08 DIAGNOSIS — Z87442 Personal history of urinary calculi: Secondary | ICD-10-CM | POA: Insufficient documentation

## 2018-12-08 DIAGNOSIS — J069 Acute upper respiratory infection, unspecified: Secondary | ICD-10-CM | POA: Diagnosis not present

## 2018-12-08 DIAGNOSIS — M069 Rheumatoid arthritis, unspecified: Secondary | ICD-10-CM | POA: Insufficient documentation

## 2018-12-08 DIAGNOSIS — G629 Polyneuropathy, unspecified: Secondary | ICD-10-CM | POA: Insufficient documentation

## 2018-12-08 DIAGNOSIS — G4730 Sleep apnea, unspecified: Secondary | ICD-10-CM | POA: Insufficient documentation

## 2018-12-08 DIAGNOSIS — I4891 Unspecified atrial fibrillation: Secondary | ICD-10-CM | POA: Diagnosis not present

## 2018-12-08 DIAGNOSIS — D472 Monoclonal gammopathy: Secondary | ICD-10-CM | POA: Insufficient documentation

## 2018-12-08 DIAGNOSIS — M129 Arthropathy, unspecified: Secondary | ICD-10-CM | POA: Diagnosis not present

## 2018-12-08 DIAGNOSIS — I700 Atherosclerosis of aorta: Secondary | ICD-10-CM | POA: Insufficient documentation

## 2018-12-08 LAB — CBC WITH DIFFERENTIAL/PLATELET
Abs Immature Granulocytes: 0.02 10*3/uL (ref 0.00–0.07)
Basophils Absolute: 0 10*3/uL (ref 0.0–0.1)
Basophils Relative: 1 %
Eosinophils Absolute: 0.2 10*3/uL (ref 0.0–0.5)
Eosinophils Relative: 3 %
HCT: 49.6 % (ref 39.0–52.0)
Hemoglobin: 16.3 g/dL (ref 13.0–17.0)
Immature Granulocytes: 0 %
Lymphocytes Relative: 21 %
Lymphs Abs: 1.2 10*3/uL (ref 0.7–4.0)
MCH: 30.6 pg (ref 26.0–34.0)
MCHC: 32.9 g/dL (ref 30.0–36.0)
MCV: 93.2 fL (ref 80.0–100.0)
Monocytes Absolute: 0.7 10*3/uL (ref 0.1–1.0)
Monocytes Relative: 12 %
Neutro Abs: 3.5 10*3/uL (ref 1.7–7.7)
Neutrophils Relative %: 63 %
Platelets: 150 10*3/uL (ref 150–400)
RBC: 5.32 MIL/uL (ref 4.22–5.81)
RDW: 13 % (ref 11.5–15.5)
WBC: 5.5 10*3/uL (ref 4.0–10.5)
nRBC: 0 % (ref 0.0–0.2)

## 2018-12-08 LAB — CMP (CANCER CENTER ONLY)
ALT: 37 U/L (ref 0–44)
AST: 36 U/L (ref 15–41)
Albumin: 3.9 g/dL (ref 3.5–5.0)
Alkaline Phosphatase: 76 U/L (ref 38–126)
Anion gap: 11 (ref 5–15)
BUN: 17 mg/dL (ref 8–23)
CO2: 24 mmol/L (ref 22–32)
Calcium: 8.9 mg/dL (ref 8.9–10.3)
Chloride: 105 mmol/L (ref 98–111)
Creatinine: 0.98 mg/dL (ref 0.61–1.24)
GFR, Est AFR Am: >60 mL/min (ref >60)
GFR, Estimated: >60 mL/min (ref >60)
Glucose, Bld: 193 mg/dL — ABNORMAL HIGH (ref 70–99)
Potassium: 4.2 mmol/L (ref 3.5–5.1)
Sodium: 140 mmol/L (ref 135–145)
Total Bilirubin: 0.9 mg/dL (ref 0.3–1.2)
Total Protein: 6.8 g/dL (ref 6.5–8.1)

## 2018-12-11 LAB — MULTIPLE MYELOMA PANEL, SERUM
Albumin SerPl Elph-Mcnc: 3.8 g/dL (ref 2.9–4.4)
Albumin/Glob SerPl: 1.5 (ref 0.7–1.7)
Alpha 1: 0.2 g/dL (ref 0.0–0.4)
Alpha2 Glob SerPl Elph-Mcnc: 0.8 g/dL (ref 0.4–1.0)
B-Globulin SerPl Elph-Mcnc: 0.8 g/dL (ref 0.7–1.3)
Gamma Glob SerPl Elph-Mcnc: 0.8 g/dL (ref 0.4–1.8)
Globulin, Total: 2.6 g/dL (ref 2.2–3.9)
IgA: 126 mg/dL (ref 61–437)
IgG (Immunoglobin G), Serum: 751 mg/dL (ref 603–1613)
IgM (Immunoglobulin M), Srm: 249 mg/dL — ABNORMAL HIGH (ref 15–143)
Total Protein ELP: 6.4 g/dL (ref 6.0–8.5)

## 2018-12-11 LAB — KAPPA/LAMBDA LIGHT CHAINS
Kappa free light chain: 200.6 mg/L — ABNORMAL HIGH (ref 3.3–19.4)
Kappa, lambda light chain ratio: 13.2 — ABNORMAL HIGH (ref 0.26–1.65)
Lambda free light chains: 15.2 mg/L (ref 5.7–26.3)

## 2018-12-15 ENCOUNTER — Telehealth: Payer: Self-pay | Admitting: Hematology

## 2018-12-15 ENCOUNTER — Inpatient Hospital Stay: Payer: Medicare Other | Admitting: Hematology

## 2018-12-15 ENCOUNTER — Other Ambulatory Visit: Payer: Self-pay

## 2018-12-15 VITALS — BP 170/105 | HR 77 | Temp 98.3°F | Resp 19 | Ht 70.0 in | Wt 267.5 lb

## 2018-12-15 DIAGNOSIS — D472 Monoclonal gammopathy: Secondary | ICD-10-CM

## 2018-12-15 DIAGNOSIS — J069 Acute upper respiratory infection, unspecified: Secondary | ICD-10-CM | POA: Diagnosis not present

## 2018-12-15 DIAGNOSIS — Z87442 Personal history of urinary calculi: Secondary | ICD-10-CM

## 2018-12-15 DIAGNOSIS — Z79899 Other long term (current) drug therapy: Secondary | ICD-10-CM

## 2018-12-15 DIAGNOSIS — M129 Arthropathy, unspecified: Secondary | ICD-10-CM

## 2018-12-15 DIAGNOSIS — I700 Atherosclerosis of aorta: Secondary | ICD-10-CM

## 2018-12-15 DIAGNOSIS — G4730 Sleep apnea, unspecified: Secondary | ICD-10-CM

## 2018-12-15 DIAGNOSIS — M069 Rheumatoid arthritis, unspecified: Secondary | ICD-10-CM | POA: Diagnosis not present

## 2018-12-15 DIAGNOSIS — Z8711 Personal history of peptic ulcer disease: Secondary | ICD-10-CM

## 2018-12-15 DIAGNOSIS — I10 Essential (primary) hypertension: Secondary | ICD-10-CM

## 2018-12-15 DIAGNOSIS — I4891 Unspecified atrial fibrillation: Secondary | ICD-10-CM

## 2018-12-15 DIAGNOSIS — K219 Gastro-esophageal reflux disease without esophagitis: Secondary | ICD-10-CM

## 2018-12-15 DIAGNOSIS — R768 Other specified abnormal immunological findings in serum: Secondary | ICD-10-CM | POA: Diagnosis not present

## 2018-12-15 DIAGNOSIS — G629 Polyneuropathy, unspecified: Secondary | ICD-10-CM

## 2018-12-15 NOTE — Telephone Encounter (Signed)
Scheduled appt per 4/10 los. °

## 2018-12-15 NOTE — Progress Notes (Signed)
HEMATOLOGY/ONCOLOGY CONSULTATION NOTE  Date of Service: 12/15/2018  Patient Care Team: Lujean Amel, MD as PCP - General (Family Medicine) Minus Breeding, MD as PCP - Cardiology (Cardiology)  CHIEF COMPLAINTS/PURPOSE OF CONSULTATION:  Elevated serum kappa light chains  HISTORY OF PRESENTING ILLNESS:   Glen Bolton is a wonderful 78 y.o. male who has been referred to [place] by my colleague Dr Grace Isaac for evaluation and management of Elevated serum kappa light chains. He is accompanied today by his wife. The pt reports that he is doing well overall.   The pt reports that he had no symptoms before his abnormal lab results prompting his initial visit with Dr Lebron Conners. He denies any concerns for inflammation besides an ear infection for the last 3 weeks and a viral infection as well. He is being followed by his PCP and has had a 20dB drop in his right ear threshold. He has had upper respiratory symptoms including a bad cough that hurt his abdomen which is coupled by a bruise at his lower abdomen. He denies any trauma to the area but does take Xarelto for his Afib. He has been treated with a z-pack and other antibiotics. He has had tubes put in his ear 3 times in the past but denies any ear pain.   He notes that he has arthritis in his knees but denies concern for rheumatoid arthritis.   Of note prior to the patient's visit today, pt has had PET/CT completed on 02/06/18 with results revealing 1. No hypermetabolic mass or adenopathy identified. 2. No evidence for hepatomegaly or splenomegaly. 3. Mild low level FDG uptake throughout the bone marrow is noted. Nonspecific and may be physiologic. 4.  Aortic Atherosclerosis (ICD10-I70.0). 5. Bilateral maxillary sinus disease.   Surgical pathology 02/03/18 revealed HYPERCELLULAR BONE MARROW FOR AGE WITH TRILINEAGE HEMATOPOIESIS. - A MINOR PLASMA CELL COMPONENT WITH KAPPA LIGHT CHAIN EXCESS. - A FEW SMALL LYMPHOID AGGREGATES PRESENT.   Most  recent lab results (02/03/18) of CBC w/ diff  is as follows: all values are WNL. UPEP Light chains 01/26/18 showed Total Protein ur/day at 156, Free kappa lt chains, ur at 93.20, Free lambda lt chains at 7.61, Free Kappa/Lambda ratio at 12.25.  MMP 01/24/18 showed all values WNL except for IgM at 244. M Protein not observed.   On review of systems, pt reports cough, ear infection, lower abdomen superficial bruising, and denies fevers, chills, night sweats, leg swelling, testicular pain or swelling, and any other symptoms.   Interval History:   Glen Bolton returns today for management and evaluation of his elevated serum free light chains. The patient's last visit with [place] was on 06/16/18. The pt reports that he is doing well overall.   The pt reports that he has not felt any differently in the interim since our last visit 6 months ago. The pt notes that his neuropathy has been stable, and he notes once he starts moving, his balance "is alright." He notes that he has not had any unexpected weight loss, new bone pains or new back pains. He notes stable arthritis in his knees, and has been having injections in his knees which has been helpful.   The pt has continued follow up with his cardiologist. His last ECHO was in 2017. He denies any changes in symptoms or SOB.  The pt endorses having "a lot of post-nasal drip," chronically. He has environmental allergies and takes allergy medication.  Lab results (12/08/18) of CBC w/diff and CMP is as  follows: all values are WNL except for Glucose at 193. 12/08/18 MMP revealed all values WNL except for IgM at 249 12/08/18 SFLC revealed Kappa at 200.6 with K:L ratio of 13.2  On review of systems, pt reports stable energy levels, stable knee arthritic pains, post-nasal drip, and denies SOB, unexpected weight loss, new bone pains, back pains, pain along the spine, abdominal pains, leg swelling, and any other symptoms.  MEDICAL HISTORY:  Past Medical History:  Diagnosis  Date  . Arthritis   . Atrial fibrillation ([place])   . Dysrhythmia   . GERD (gastroesophageal reflux disease)   . Glaucoma   . Hard of hearing   . History of gastritis   . History of kidney stones   . History of stomach ulcers   . Hx of migraines    no migraines since he was in his 56's  . Hypertension   . Peripheral neuropathy 12/26/2017  . Sleep apnea    CPAP    SURGICAL HISTORY: Past Surgical History:  Procedure Laterality Date  . CATARACT EXTRACTION Bilateral 2014  . COLONOSCOPY    . ESOPHAGOGASTRODUODENOSCOPY    . EYE SURGERY    . INGUINAL HERNIA REPAIR Left    x 2  . Rotator cuff surgery Right 2013  . TOTAL HIP ARTHROPLASTY Left 10/27/2015   Procedure: TOTAL HIP ARTHROPLASTY ANTERIOR APPROACH;  Surgeon: Frederik Pear, MD;  Location: [facility];  Service: Orthopedics;  Laterality: Left;    SOCIAL HISTORY: Social History   Socioeconomic History  . Marital status: Married    Spouse name: Not on file  . Number of children: 2  . Years of education: Not on file  . Highest education level: Not on file  Occupational History  . Occupation: Golf [employer]  Social Needs  . Financial resource strain: Not on file  . Food insecurity:    Worry: Not on file    Inability: Not on file  . Transportation needs:    Medical: Not on file    Non-medical: Not on file  Tobacco Use  . Smoking status: Never Smoker  . Smokeless tobacco: Never Used  Substance and Sexual Activity  . Alcohol use: Yes    Alcohol/week: 1.0 standard drinks    Types: 1 Glasses of wine per week    Comment: daily  . Drug use: No  . Sexual activity: Not on file  Lifestyle  . Physical activity:    Days per week: Not on file    Minutes per session: Not on file  . Stress: Not on file  Relationships  . Social connections:    Talks on phone: Not on file    Gets together: Not on file    Attends religious service: Not on file    Active member of club or organization: Not on file    Attends meetings of clubs or  organizations: Not on file    Relationship status: Not on file  . Intimate partner violence:    Fear of current or ex partner: Not on file    Emotionally abused: Not on file    Physically abused: Not on file    Forced sexual activity: Not on file  Other Topics Concern  . Not on file  Social History Narrative   Lives with wife   Caffeine use: 2 cups coffee daily   Gun [employer]   Right handed     FAMILY HISTORY: Family History  Problem Relation Age of Onset  . Hyperlipidemia Brother   .  Stroke Sister 60    ALLERGIES:  has No Known Allergies.  MEDICATIONS:  Current Outpatient Medications  Medication Sig Dispense Refill  . bimatoprost (LUMIGAN) 0.01 % SOLN Place 1 drop into both eyes at bedtime.     . dorzolamide-timolol (COSOPT) 22.3-6.8 MG/ML ophthalmic solution Place 1 drop into both eyes 2 (two) times daily.   8  . metoprolol succinate (TOPROL-XL) 50 MG 24 hr tablet TAKE 1 TABLET BY MOUTH DAILY WITH OR IMMEDIATELY AFTER A MEAL (Patient taking differently: Take 50 mg by mouth daily. WITH OR IMMEDIATELY AFTER A MEAL) 90 tablet 0  . Multiple Vitamins-Minerals (MULTIVITAMIN & MINERAL PO) Take 1 tablet by mouth daily.    Marland Kitchen olmesartan (BENICAR) 20 MG tablet Take 20 mg by mouth daily.   4  . Probiotic Product (PROBIOTIC PO) Take 1 tablet by mouth daily.     Alveda Reasons 20 MG TABS tablet TAKE 1 TABLET BY MOUTH WITH SUPPER. PLEASE SCHEDULE MD APPT FOR FURTHER REFILLS 30 tablet 0   No current facility-administered medications for this visit.     REVIEW OF SYSTEMS:    A 10+ POINT REVIEW OF SYSTEMS WAS OBTAINED including neurology, dermatology, psychiatry, cardiac, respiratory, lymph, extremities, GI, GU, Musculoskeletal, constitutional, breasts, reproductive, HEENT.  All pertinent positives are noted in the HPI.  All others are negative.   PHYSICAL EXAMINATION: . Vitals:   12/15/18 1025  BP: (!) 170/105  Pulse: 77  Resp: 19  Temp: 98.3 F (36.8 C)  SpO2: 97%   Filed Weights    12/15/18 1025  Weight: 267 lb 8 oz (121.3 kg)   .Body mass index is 38.38 kg/m.  GENERAL:alert, in no acute distress and comfortable SKIN: no acute rashes, no significant lesions EYES: conjunctiva are pink and non-injected, sclera anicteric OROPHARYNX: MMM, no exudates, no oropharyngeal erythema or ulceration NECK: supple, no JVD LYMPH:  no palpable lymphadenopathy in the cervical, axillary or inguinal regions LUNGS: clear to auscultation b/l with normal respiratory effort HEART: regular rate & rhythm ABDOMEN:  normoactive bowel sounds , non tender, not distended. No palpable hepatosplenomegaly.  Extremity: no pedal edema PSYCH: alert & oriented x 3 with fluent speech NEURO: no focal motor/sensory deficits   LABORATORY DATA:  I have reviewed the data as listed  . CBC Latest Ref Rng & Units 12/08/2018 06/09/2018 02/03/2018  WBC 4.0 - 10.5 K/uL 5.5 5.4 8.7  Hemoglobin 13.0 - 17.0 g/dL 16.3 15.8 13.9  Hematocrit 39.0 - 52.0 % 49.6 47.9 41.5  Platelets 150 - 400 K/uL 150 145 229    . CMP Latest Ref Rng & Units 12/08/2018 06/09/2018 01/24/2018  Glucose 70 - 99 mg/dL 193(H) 116(H) 109  BUN 8 - 23 mg/dL 17 18 17   Creatinine 0.61 - 1.24 mg/dL 0.98 0.82 0.85  Sodium 135 - 145 mmol/L 140 141 139  Potassium 3.5 - 5.1 mmol/L 4.2 4.1 3.9  Chloride 98 - 111 mmol/L 105 107 106  CO2 22 - 32 mmol/L 24 24 26   Calcium 8.9 - 10.3 mg/dL 8.9 9.2 9.1  Total Protein 6.5 - 8.1 g/dL 6.8 7.0 7.0  Total Bilirubin 0.3 - 1.2 mg/dL 0.9 0.9 0.7  Alkaline Phos 38 - 126 U/L 76 80 81  AST 15 - 41 U/L 36 34 31  ALT 0 - 44 U/L 37 34 31   02/03/18 BM Report:    02/03/18 Flow Cytometry:   02/03/18 Cytogenetics:    RADIOGRAPHIC STUDIES: I have personally reviewed the radiological images as listed and agreed  with the findings in the report. No results found.  ASSESSMENT & PLAN:   78 y.o. male with  1. Light chain MGUS  PET/CT from 02/06/18 did not reveal any bone lesions and revealed Mild low level FDG  uptake throughout the bone marrow is noted.  Labs upon initial presentation from 02/03/18 and 01/24/18, Free kappa lt chains in the urine were elevated at 93.20. No anemia, no abnormal kidney functions. Nonspecific and may be physiologic.  IgM increase noted without M-spike 02/03/18 BM bx pathology indicated 2% plasma cells 02/03/18 Cytogenetics showed normal genetics without specific mutation Slow moving lymphoma or plasma cell disorder vs reactive to chronic inflammation with frequent sinus and ear infections ?  PLAN: -Discussed pt labwork from 12/08/18; blood counts are normal, chemistries are stable. No renal issues. Kappa light chains stable at 200.6 with K:L ratio at 13.2. No M spike. -SFLC stable over the last 10 months without monoclonal protein, which is reassuring, despite abnormal K:L ratio -Discussed the CRAB criteria. No hypercalcemia, normal renal function, no anemia, no evidence or concern of bone tumors -Could be slow moving lymphoma, plasma cell disorder, or a reactive change in the population of plasma cells -Discussed that continuing watchful observation in light of the stability of his labs and the absence of constitutional symptoms or bone pains, would be quite reasonable- and pt prefers this -If labs or symptoms change, would consider re-evaluation with a repeat BM Bx -Pt will let me or his PCP know if he develops any constitutional symptoms or new, concerning symptoms in the interim -Follow up with PCP for age-appropriate cancer screening and symptom-directed work up -Continue follow up with neurology for management of neuropathy and Vitamin B12 deficiency  -Will continue tracking kidney functions, calcium and HGB -No new heart, kidney, or neuropathy concerns -Continue follow up with cardiology, defer choice of repeating an ECHO but this could be beneficial from stand point of evaluating possibility of amyloidosis. Could repeat in next 6-12 months.  -Will see the pt back in 6  months  RTC with Dr Irene Limbo with labs in 6 months. Plz schedule labs 1 week prior to clinic visit.   All of the patients questions were answered with apparent satisfaction. The patient knows to call the clinic with any problems, questions or concerns.  The total time spent in the appt was 30 minutes and more than 50% was on counseling and direct patient cares.    Sullivan Lone MD MS AAHIVMS [facility] Hematology/Oncology [facility]  (Office):       [phone] (Work cell):  [phone] (Fax):           [phone]  12/15/2018 10:58 AM  I, Baldwin Jamaica, am acting as a scribe for Dr. Sullivan Lone.   .I have reviewed the above documentation for accuracy and completeness, and I agree with the above. Brunetta Genera MD

## 2018-12-21 ENCOUNTER — Other Ambulatory Visit: Payer: Self-pay | Admitting: Cardiology

## 2018-12-22 NOTE — Telephone Encounter (Signed)
Xarelto refilled.

## 2019-01-16 ENCOUNTER — Other Ambulatory Visit: Payer: Self-pay | Admitting: Cardiology

## 2019-03-08 DIAGNOSIS — H401132 Primary open-angle glaucoma, bilateral, moderate stage: Secondary | ICD-10-CM | POA: Diagnosis not present

## 2019-03-26 NOTE — Progress Notes (Signed)
Virtual Visit via Telephone Note   This visit type was conducted due to national recommendations for restrictions regarding the COVID-19 Pandemic (e.g. social distancing) in an effort to limit this patient's exposure and mitigate transmission in our community.  Due to his co-morbid illnesses, this patient is at least at moderate risk for complications without adequate follow up.  This format is felt to be most appropriate for this patient at this time.  The patient did not have access to video technology/had technical difficulties with video requiring transitioning to audio format only (telephone).  All issues noted in this document were discussed and addressed.  No physical exam could be performed with this format.  Please refer to the patient's chart for his  consent to telehealth for [facility].   Date:  03/28/2019   ID:  Glen Bolton, DOB 1941/05/28, MRN [id number]  Patient Location: Home Provider Location: Home  PCP:  Lujean Amel, MD  Cardiologist:  Minus Breeding, MD  Electrophysiologist:  None   Evaluation Performed:  Follow-Up Visit  Chief Complaint:  Atrial Fib  History of Present Illness:    Glen Bolton is a 78 y.o. male with who presents for evaluation of atrial fibrillation.  Since I last saw him he has been doing okay.  He golfs occasionally.  He tries to little exercise.  He has not had any symptomatic tachypalpitations and does not feel his fibrillation.  He has had no problems taking his anticoagulation.  He is had no new shortness of breath, PND, orthopnea.  He did have some elevated blood pressures and had hydrochlorothiazide added by his primary provider and had his olmesartan doubled and his blood pressure today is excellent.   The patient does not have symptoms concerning for COVID-19 infection (fever, chills, cough, or new shortness of breath).    Past Medical History:  Diagnosis Date  . Arthritis   . Atrial fibrillation ([place])   . Dysrhythmia   . GERD  (gastroesophageal reflux disease)   . Glaucoma   . Hard of hearing   . History of gastritis   . History of kidney stones   . History of stomach ulcers   . Hx of migraines    no migraines since he was in his 19's  . Hypertension   . Peripheral neuropathy 12/26/2017  . Sleep apnea    CPAP   Past Surgical History:  Procedure Laterality Date  . CATARACT EXTRACTION Bilateral 2014  . COLONOSCOPY    . ESOPHAGOGASTRODUODENOSCOPY    . EYE SURGERY    . INGUINAL HERNIA REPAIR Left    x 2  . Rotator cuff surgery Right 2013  . TOTAL HIP ARTHROPLASTY Left 10/27/2015   Procedure: TOTAL HIP ARTHROPLASTY ANTERIOR APPROACH;  Surgeon: Frederik Pear, MD;  Location: [facility];  Service: Orthopedics;  Laterality: Left;     Current Meds  Medication Sig  . bimatoprost (LUMIGAN) 0.01 % SOLN Place 1 drop into both eyes at bedtime.   . dorzolamide-timolol (COSOPT) 22.3-6.8 MG/ML ophthalmic solution Place 1 drop into both eyes 2 (two) times daily.   . hydrochlorothiazide (HYDRODIURIL) 12.5 MG tablet Take 12.5 mg by mouth daily.  . metoprolol succinate (TOPROL-XL) 50 MG 24 hr tablet TAKE 1 TABLET BY MOUTH DAILY WITH OR IMMEDIATELY AFTER A MEAL  . Multiple Vitamins-Minerals (MULTIVITAMIN & MINERAL PO) Take 1 tablet by mouth daily.  Marland Kitchen olmesartan (BENICAR) 40 MG tablet Take 40 mg by mouth daily.   . Probiotic Product (PROBIOTIC PO) Take 1 tablet by  mouth daily.   Alveda Reasons 20 MG TABS tablet TAKE 1 TABLET BY MOUTH WITH SUPPER     Allergies:   Patient has no known allergies.   Social History   Tobacco Use  . Smoking status: Never Smoker  . Smokeless tobacco: Never Used  Substance Use Topics  . Alcohol use: Yes    Alcohol/week: 1.0 standard drinks    Types: 1 Glasses of wine per week    Comment: daily  . Drug use: No     Family Hx: The patient's family history includes Hyperlipidemia in his brother; Stroke (age of onset: 62) in his sister.  ROS:   Please see the history of present illness.    As  stated in the HPI and negative for all other systems.   Prior CV studies:   The following studies were reviewed today:  Labs  Labs/Other Tests and Data Reviewed:    EKG:  No ECG reviewed.  Recent Labs: 12/08/2018: ALT 37; BUN 17; Creatinine 0.98; Hemoglobin 16.3; Platelets 150; Potassium 4.2; Sodium 140   Recent Lipid Panel No results found for: CHOL, TRIG, HDL, CHOLHDL, LDLCALC, LDLDIRECT  Wt Readings from Last 3 Encounters:  03/28/19 264 lb (119.7 kg)  12/15/18 267 lb 8 oz (121.3 kg)  12/04/18 259 lb (117.5 kg)     Objective:    Vital Signs:  BP 123/78   Pulse (!) 58   Ht 5\' 10"  (1.778 m)   Wt 264 lb (119.7 kg)   BMI 37.88 kg/m    NA  ASSESSMENT & PLAN:     CAF- He has good heart rate control by his report.  He tolerates anticoagulation.  No change in therapy.  Rate controlled, no history of sustained tachycardia.  He has had recent blood work and his hemoglobin was 16.3.  Renal function is normal.  Essential HTN Blood pressures well controlled.  No change in therapy.  We will continue the meds as listed.  Chronic anticoagulation CHADS VASC-3 on Xarelto.  See above.    Sleep apnea He uses CPAP   COVID-19 Education: The signs and symptoms of COVID-19 were discussed with the patient and how to seek care for testing (follow up with PCP or arrange E-visit).  The importance of social distancing was discussed today.  Time:   Today, I have spent 16 minutes with the patient with telehealth technology discussing the above problems.     Medication Adjustments/Labs and Tests Ordered: Current medicines are reviewed at length with the patient today.  Concerns regarding medicines are outlined above.   Tests Ordered: No orders of the defined types were placed in this encounter.   Medication Changes: No orders of the defined types were placed in this encounter.   Follow Up:  In Person in one year.   Signed, Minus Breeding, MD  03/28/2019 10:19 AM    [facility]

## 2019-03-27 ENCOUNTER — Telehealth: Payer: Self-pay

## 2019-03-27 NOTE — Telephone Encounter (Signed)
7-22 @9AM  JH [id number]  PLEASE ASK COVID QUESTIONS FOR APPT

## 2019-03-28 ENCOUNTER — Telehealth (INDEPENDENT_AMBULATORY_CARE_PROVIDER_SITE_OTHER): Payer: Medicare Other | Admitting: Cardiology

## 2019-03-28 ENCOUNTER — Encounter: Payer: Self-pay | Admitting: Cardiology

## 2019-03-28 VITALS — BP 123/78 | HR 58 | Ht 70.0 in | Wt 264.0 lb

## 2019-03-28 DIAGNOSIS — Z7189 Other specified counseling: Secondary | ICD-10-CM

## 2019-03-28 DIAGNOSIS — I10 Essential (primary) hypertension: Secondary | ICD-10-CM

## 2019-03-28 DIAGNOSIS — I4821 Permanent atrial fibrillation: Secondary | ICD-10-CM | POA: Diagnosis not present

## 2019-03-28 DIAGNOSIS — Z7901 Long term (current) use of anticoagulants: Secondary | ICD-10-CM

## 2019-03-28 NOTE — Telephone Encounter (Signed)
Pt is not here for 9am appt. Called pt he states that he thought that his appt was at 10am therefore, he is still at home. I have changed this to a virtual appt by telephone. I will discuss this with [facility] and he will CB for telephone appt. Pt aware that we are "fitting him in" so he will get BP and weight and await this call

## 2019-04-19 ENCOUNTER — Other Ambulatory Visit: Payer: Self-pay | Admitting: Cardiology

## 2019-04-19 NOTE — Telephone Encounter (Signed)
58m 121.3kg Scr 0.98 12/08/18 ccr 117mlmin Lovw/hochrein 03/28/19

## 2019-06-06 ENCOUNTER — Ambulatory Visit (INDEPENDENT_AMBULATORY_CARE_PROVIDER_SITE_OTHER): Payer: Medicare Other | Admitting: Adult Health

## 2019-06-06 ENCOUNTER — Ambulatory Visit: Payer: Medicare Other | Admitting: Adult Health

## 2019-06-06 ENCOUNTER — Other Ambulatory Visit: Payer: Self-pay

## 2019-06-06 VITALS — BP 146/70 | HR 67 | Temp 97.5°F | Ht 70.0 in | Wt 264.0 lb

## 2019-06-06 DIAGNOSIS — D472 Monoclonal gammopathy: Secondary | ICD-10-CM | POA: Diagnosis not present

## 2019-06-06 DIAGNOSIS — W19XXXA Unspecified fall, initial encounter: Secondary | ICD-10-CM

## 2019-06-06 NOTE — Patient Instructions (Signed)
Your Plan:  Continue to monitor symptoms If your symptoms worsen or you develop new symptoms please let us know.   Thank you for coming to see us at [facility]. I hope we have been able to provide you high quality care today.  You may receive a patient satisfaction survey over the next few weeks. We would appreciate your feedback and comments so that we may continue to improve ourselves and the health of our patients.   

## 2019-06-06 NOTE — Progress Notes (Signed)
I have read the note, and I agree with the clinical assessment and plan.  Nylan Nevel K Mettie Roylance   

## 2019-06-06 NOTE — Progress Notes (Signed)
PATIENT: Glen Bolton DOB: 12/09/1940  REASON FOR VISIT: follow up HISTORY FROM: patient  HISTORY OF PRESENT ILLNESS: Today 06/06/19: Glen Bolton is a 78 year old male with a history of peripheral neuropathy associated with a IgM monoclonal antibody.  He returns today for follow-up.  He is not on any medication.  Reports that he does not have any discomfort with his neuropathy.  He states that he only has numbness.  He states that if he has been on his feet all day it seems that the numbness may be worse at the end of the day.  He does not use a cane or walker.  He states that he had a fall 1 month ago.  States that he bent over and then fell down.  He states that he did hit his head but has not had any subsequent symptoms since then.  His wife did call 911 but they did not feel he needed to go to the ED.  He denies headache, changes with his vision, any new numbness or weakness in extremities.  He states that he does have some stiffness in the right side of the neck.  He states that this was present before the fall and may be slightly worse after the fall.  He is on Xarelto overall he feels that he is doing well.  He returns today for an evaluation.   HISTORY Glen Bolton is a 78 year old right-handed white male with a history of a peripheral neuropathy associated with an IgM monoclonal antibody.  He has had a full work-up through oncology, a bone marrow biopsy appeared to be benign.  The patient is being followed through hematology/oncology currently.  The patient does have a moderate level peripheral neuropathy, he has a mild gait disorder, he reports a cold sensation in the feet when he is inactive, he is able to rest well at night however.  He denies any numbness or discomfort in the hands.  He has not had any falls, he has to be more cognizant as to how he is walking, he has to be careful going upstairs.  He has degenerative changes in the hips and knees, he has some fatigue and discomfort in the legs  with walking longer distances.  He has had a prior left total hip replacement.  The patient has also had problems with vertigo in the past that at times has been severe associated with nausea.  He has had tubes put in the ears which has helped some, and this has also helped his hearing.  The patient returns to the office today for an evaluation.   REVIEW OF SYSTEMS: Out of a complete 14 system review of symptoms, the patient complains only of the following symptoms, and all other reviewed systems are negative.  See HPI    ALLERGIES: No Known Allergies  HOME MEDICATIONS: Outpatient Medications Prior to Visit  Medication Sig Dispense Refill   bimatoprost (LUMIGAN) 0.01 % SOLN Place 1 drop into both eyes at bedtime.      dorzolamide-timolol (COSOPT) 22.3-6.8 MG/ML ophthalmic solution Place 1 drop into both eyes 2 (two) times daily.   8   hydrochlorothiazide (HYDRODIURIL) 12.5 MG tablet Take 12.5 mg by mouth daily.     metoprolol succinate (TOPROL-XL) 50 MG 24 hr tablet TAKE 1 TABLET BY MOUTH DAILY WITH OR IMMEDIATELY AFTER A MEAL 90 tablet 3   Multiple Vitamins-Minerals (MULTIVITAMIN & MINERAL PO) Take 1 tablet by mouth daily.     olmesartan (BENICAR) 40 MG tablet Take  40 mg by mouth daily.   4   Probiotic Product (PROBIOTIC PO) Take 1 tablet by mouth daily.      XARELTO 20 MG TABS tablet TAKE 1 TABLET BY MOUTH WITH SUPPER 90 tablet 1   No facility-administered medications prior to visit.     PAST MEDICAL HISTORY: Past Medical History:  Diagnosis Date   Arthritis    Atrial fibrillation ([place])    Dysrhythmia    GERD (gastroesophageal reflux disease)    Glaucoma    Hard of hearing    History of gastritis    History of kidney stones    History of stomach ulcers    Hx of migraines    no migraines since he was in his 30's   Hypertension    Peripheral neuropathy 12/26/2017   Sleep apnea    CPAP    PAST SURGICAL HISTORY: Past Surgical History:  Procedure  Laterality Date   CATARACT EXTRACTION Bilateral 2014   COLONOSCOPY     ESOPHAGOGASTRODUODENOSCOPY     EYE SURGERY     INGUINAL HERNIA REPAIR Left    x 2   Rotator cuff surgery Right 2013   TOTAL HIP ARTHROPLASTY Left 10/27/2015   Procedure: TOTAL HIP ARTHROPLASTY ANTERIOR APPROACH;  Surgeon: Frederik Pear, MD;  Location: [facility];  Service: Orthopedics;  Laterality: Left;    FAMILY HISTORY: Family History  Problem Relation Age of Onset   Hyperlipidemia Brother    Stroke Sister 81    SOCIAL HISTORY: Social History   Socioeconomic History   Marital status: Married    Spouse name: Not on file   Number of children: 2   Years of education: Not on file   Highest education level: Not on file  Occupational History   Occupation: Golf course  Social Designer, fashion/clothing strain: Not on file   Food insecurity    Worry: Not on file    Inability: Not on file   Transportation needs    Medical: Not on file    Non-medical: Not on file  Tobacco Use   Smoking status: Never Smoker   Smokeless tobacco: Never Used  Substance and Sexual Activity   Alcohol use: Yes    Alcohol/week: 1.0 standard drinks    Types: 1 Glasses of wine per week    Comment: daily   Drug use: No   Sexual activity: Not on file  Lifestyle   Physical activity    Days per week: Not on file    Minutes per session: Not on file   Stress: Not on file  Relationships   Social connections    Talks on phone: Not on file    Gets together: Not on file    Attends religious service: Not on file    Active member of club or organization: Not on file    Attends meetings of clubs or organizations: Not on file    Relationship status: Not on file   Intimate partner violence    Fear of current or ex partner: Not on file    Emotionally abused: Not on file    Physically abused: Not on file    Forced sexual activity: Not on file  Other Topics Concern   Not on file  Social History Narrative    Lives with wife   Caffeine use: 2 cups coffee daily   Gun smith   Right handed       PHYSICAL EXAM  Vitals:   06/06/19 0814  BP: Marland Kitchen)  146/70  Pulse: 67  Temp: (!) 97.5 F (36.4 C)  SpO2: 98%  Weight: 264 lb (119.7 kg)  Height: _0  (1.778 m)   Body mass index is 37.88 kg/m.  Generalized: Well developed, in no acute distress   Neurological examination  Mentation: Alert oriented to time, place, history taking. Follows all commands speech and language fluent Cranial nerve II-XII: Pupils were equal round reactive to light. Extraocular movements were full, visual field were full on confrontational test. Head turning and shoulder shrug  were normal and symmetric. Motor: The motor testing reveals 5 over 5 strength of all 4 extremities. Good symmetric motor tone is noted throughout.  Sensory: Sensory testing is intact to soft touch on all 4 extremities. No evidence of extinction is noted.  Coordination: Cerebellar testing reveals good finger-nose-finger and heel-to-shin bilaterally.  Gait and station: Patient has a slightly wide-based gait.  Tandem gait not attempted. Reflexes: Deep tendon reflexes are symmetric but depressed throughout  DIAGNOSTIC DATA (LABS, IMAGING, TESTING) - I reviewed patient records, labs, notes, testing and imaging myself where available.  Lab Results  Component Value Date   WBC 5.5 12/08/2018   HGB 16.3 12/08/2018   HCT 49.6 12/08/2018   MCV 93.2 12/08/2018   PLT 150 12/08/2018      Component Value Date/Time   NA 140 12/08/2018 0754   K 4.2 12/08/2018 0754   CL 105 12/08/2018 0754   CO2 24 12/08/2018 0754   GLUCOSE 193 (H) 12/08/2018 0754   BUN 17 12/08/2018 0754   CREATININE 0.98 12/08/2018 0754   CALCIUM 8.9 12/08/2018 0754   PROT 6.8 12/08/2018 0754   PROT 6.7 12/26/2017 0936   ALBUMIN 3.9 12/08/2018 0754   AST 36 12/08/2018 0754   ALT 37 12/08/2018 0754   ALKPHOS 76 12/08/2018 0754   BILITOT 0.9 12/08/2018 0754   GFRNONAA >60  12/08/2018 0754   GFRAA >60 12/08/2018 0754    Lab Results  Component Value Date   WUGQBVQX45 038 12/26/2017   Lab Results  Component Value Date   TSH 0.513 08/26/2015      ASSESSMENT AND PLAN 78 y.o. year old male  has a past medical history of Arthritis, Atrial fibrillation ([place]), Dysrhythmia, GERD (gastroesophageal reflux disease), Glaucoma, Hard of hearing, History of gastritis, History of kidney stones, History of stomach ulcers, migraines, Hypertension, Peripheral neuropathy (12/26/2017), and Sleep apnea. here with:  1.  Peripheral neuropathy 2.  Fall  Overall the patient has done well.  He is not on any medication for his neuropathy.  The patient did suffer a fall and is on Xarelto.  Fortunately he has not had any new symptoms that occurred.  His exam today was relatively unremarkable except for findings related to neuropathy.  The fall was approximately 1 month ago.  I did advise that he should monitor for new symptoms such as a headache, changes in vision, numbness or weakness on one side of the body.  He voiced understanding.  Since he has been stable with his neuropathy advised that he could follow-up with his PCP and follow-up with our office on an as-needed basis.    Ward Givens, MSN, NP-C 06/06/2019, 8:21 AM [facility] [address] [place], [place] [address] [phone]

## 2019-06-15 ENCOUNTER — Other Ambulatory Visit: Payer: Self-pay

## 2019-06-15 ENCOUNTER — Inpatient Hospital Stay: Payer: Medicare Other | Attending: Hematology

## 2019-06-15 DIAGNOSIS — Z8711 Personal history of peptic ulcer disease: Secondary | ICD-10-CM | POA: Diagnosis not present

## 2019-06-15 DIAGNOSIS — R768 Other specified abnormal immunological findings in serum: Secondary | ICD-10-CM

## 2019-06-15 DIAGNOSIS — M129 Arthropathy, unspecified: Secondary | ICD-10-CM | POA: Diagnosis not present

## 2019-06-15 DIAGNOSIS — K219 Gastro-esophageal reflux disease without esophagitis: Secondary | ICD-10-CM | POA: Diagnosis not present

## 2019-06-15 DIAGNOSIS — Z79899 Other long term (current) drug therapy: Secondary | ICD-10-CM | POA: Insufficient documentation

## 2019-06-15 DIAGNOSIS — Z9181 History of falling: Secondary | ICD-10-CM | POA: Diagnosis not present

## 2019-06-15 DIAGNOSIS — D472 Monoclonal gammopathy: Secondary | ICD-10-CM

## 2019-06-15 DIAGNOSIS — I700 Atherosclerosis of aorta: Secondary | ICD-10-CM | POA: Diagnosis not present

## 2019-06-15 DIAGNOSIS — G629 Polyneuropathy, unspecified: Secondary | ICD-10-CM | POA: Diagnosis not present

## 2019-06-15 DIAGNOSIS — I4891 Unspecified atrial fibrillation: Secondary | ICD-10-CM | POA: Insufficient documentation

## 2019-06-15 DIAGNOSIS — Z87442 Personal history of urinary calculi: Secondary | ICD-10-CM | POA: Insufficient documentation

## 2019-06-15 LAB — CBC WITH DIFFERENTIAL/PLATELET
Abs Immature Granulocytes: 0.01 10*3/uL (ref 0.00–0.07)
Basophils Absolute: 0 10*3/uL (ref 0.0–0.1)
Basophils Relative: 1 %
Eosinophils Absolute: 0.2 10*3/uL (ref 0.0–0.5)
Eosinophils Relative: 3 %
HCT: 45.9 % (ref 39.0–52.0)
Hemoglobin: 15.4 g/dL (ref 13.0–17.0)
Immature Granulocytes: 0 %
Lymphocytes Relative: 25 %
Lymphs Abs: 1.2 10*3/uL (ref 0.7–4.0)
MCH: 32 pg (ref 26.0–34.0)
MCHC: 33.6 g/dL (ref 30.0–36.0)
MCV: 95.2 fL (ref 80.0–100.0)
Monocytes Absolute: 0.7 10*3/uL (ref 0.1–1.0)
Monocytes Relative: 15 %
Neutro Abs: 2.8 10*3/uL (ref 1.7–7.7)
Neutrophils Relative %: 56 %
Platelets: 148 10*3/uL — ABNORMAL LOW (ref 150–400)
RBC: 4.82 MIL/uL (ref 4.22–5.81)
RDW: 12.7 % (ref 11.5–15.5)
WBC: 5 10*3/uL (ref 4.0–10.5)
nRBC: 0 % (ref 0.0–0.2)

## 2019-06-15 LAB — CMP (CANCER CENTER ONLY)
ALT: 29 U/L (ref 0–44)
AST: 28 U/L (ref 15–41)
Albumin: 4 g/dL (ref 3.5–5.0)
Alkaline Phosphatase: 62 U/L (ref 38–126)
Anion gap: 9 (ref 5–15)
BUN: 22 mg/dL (ref 8–23)
CO2: 27 mmol/L (ref 22–32)
Calcium: 8.8 mg/dL — ABNORMAL LOW (ref 8.9–10.3)
Chloride: 107 mmol/L (ref 98–111)
Creatinine: 0.82 mg/dL (ref 0.61–1.24)
GFR, Est AFR Am: >60 mL/min (ref >60)
GFR, Estimated: >60 mL/min (ref >60)
Glucose, Bld: 117 mg/dL — ABNORMAL HIGH (ref 70–99)
Potassium: 4 mmol/L (ref 3.5–5.1)
Sodium: 143 mmol/L (ref 135–145)
Total Bilirubin: 0.9 mg/dL (ref 0.3–1.2)
Total Protein: 6.7 g/dL (ref 6.5–8.1)

## 2019-06-18 ENCOUNTER — Telehealth: Payer: Self-pay | Admitting: Hematology

## 2019-06-18 LAB — KAPPA/LAMBDA LIGHT CHAINS
Kappa free light chain: 157 mg/L — ABNORMAL HIGH (ref 3.3–19.4)
Kappa, lambda light chain ratio: 9.35 — ABNORMAL HIGH (ref 0.26–1.65)
Lambda free light chains: 16.8 mg/L (ref 5.7–26.3)

## 2019-06-18 NOTE — Telephone Encounter (Signed)
Returned patient's phone call regarding rescheduling an appointment, per patient's request 10/16 has moved to 10/14 due to patient's availability.

## 2019-06-19 LAB — MULTIPLE MYELOMA PANEL, SERUM
Albumin SerPl Elph-Mcnc: 3.6 g/dL (ref 2.9–4.4)
Albumin/Glob SerPl: 1.6 (ref 0.7–1.7)
Alpha 1: 0.2 g/dL (ref 0.0–0.4)
Alpha2 Glob SerPl Elph-Mcnc: 0.7 g/dL (ref 0.4–1.0)
B-Globulin SerPl Elph-Mcnc: 0.7 g/dL (ref 0.7–1.3)
Gamma Glob SerPl Elph-Mcnc: 0.7 g/dL (ref 0.4–1.8)
Globulin, Total: 2.3 g/dL (ref 2.2–3.9)
IgA: 117 mg/dL (ref 61–437)
IgG (Immunoglobin G), Serum: 670 mg/dL (ref 603–1613)
IgM (Immunoglobulin M), Srm: 234 mg/dL — ABNORMAL HIGH (ref 15–143)
Total Protein ELP: 5.9 g/dL — ABNORMAL LOW (ref 6.0–8.5)

## 2019-06-19 NOTE — Progress Notes (Signed)
HEMATOLOGY/ONCOLOGY CLINIC NOTE  Date of Service: 06/20/2019  Patient Care Team: Lujean Amel, MD as PCP - General (Family Medicine) Minus Breeding, MD as PCP - Cardiology (Cardiology)  CHIEF COMPLAINTS/PURPOSE OF CONSULTATION:  Elevated serum kappa light chains  HISTORY OF PRESENTING ILLNESS:   Glen Bolton is a wonderful 78 y.o. male who has been referred to [place] by my colleague Dr Grace Isaac for evaluation and management of Elevated serum kappa light chains. He is accompanied today by his wife. The pt reports that he is doing well overall.   The pt reports that he had no symptoms before his abnormal lab results prompting his initial visit with Dr Lebron Conners. He denies any concerns for inflammation besides an ear infection for the last 3 weeks and a viral infection as well. He is being followed by his PCP and has had a 20dB drop in his right ear threshold. He has had upper respiratory symptoms including a bad cough that hurt his abdomen which is coupled by a bruise at his lower abdomen. He denies any trauma to the area but does take Xarelto for his Afib. He has been treated with a z-pack and other antibiotics. He has had tubes put in his ear 3 times in the past but denies any ear pain.   He notes that he has arthritis in his knees but denies concern for rheumatoid arthritis.   Of note prior to the patient's visit today, pt has had PET/CT completed on 02/06/18 with results revealing 1. No hypermetabolic mass or adenopathy identified. 2. No evidence for hepatomegaly or splenomegaly. 3. Mild low level FDG uptake throughout the bone marrow is noted. Nonspecific and may be physiologic. 4.  Aortic Atherosclerosis (ICD10-I70.0). 5. Bilateral maxillary sinus disease.   Surgical pathology 02/03/18 revealed HYPERCELLULAR BONE MARROW FOR AGE WITH TRILINEAGE HEMATOPOIESIS. - A MINOR PLASMA CELL COMPONENT WITH KAPPA LIGHT CHAIN EXCESS. - A FEW SMALL LYMPHOID AGGREGATES PRESENT.   Most recent  lab results (02/03/18) of CBC w/ diff  is as follows: all values are WNL. UPEP Light chains 01/26/18 showed Total Protein ur/day at 156, Free kappa lt chains, ur at 93.20, Free lambda lt chains at 7.61, Free Kappa/Lambda ratio at 12.25.  MMP 01/24/18 showed all values WNL except for IgM at 244. M Protein not observed.   On review of systems, pt reports cough, ear infection, lower abdomen superficial bruising, and denies fevers, chills, night sweats, leg swelling, testicular pain or swelling, and any other symptoms.   Interval History:   Glen Bolton returns today for management and evaluation of his elevated serum free light chains. The patient's last visit with [place] was on 12/15/2018. The pt reports that he is doing well overall.  The pt reports that he had a fall, in part, due to his neuropathy and positional vertigo. He hurt his nose but did not need stiches or other medical intervention. Pt denies any changes in his neuropathy but notes that his extremities often feel cold. Pt Is not taking a Vitamin B12 supplement but his levels were wnl last year. He does take a multivitamin. He denies any new symptoms related to his heart. Pt wears compression socks while at work.   Lab results (06/15/19) of CBC w/diff and CMP is as follows: all values are WNL except for PLTs at 148K, Glucose at 117, Calcium at 8.8. 06/15/2019 K/L light chains is as follows: Kappa free light chain at 157.0, Lamda free light chains at 16.8, K/L light chain ratio at  9.35 06/15/2019 MMP is as follows: all values are WNL except for IgM at 234, Total Protein at 5.9.   On review of systems, pt reports chronic neuropathy and denies SOB, chest pain and any other symptoms.   MEDICAL HISTORY:  Past Medical History:  Diagnosis Date  . Arthritis   . Atrial fibrillation ([place])   . Dysrhythmia   . GERD (gastroesophageal reflux disease)   . Glaucoma   . Hard of hearing   . History of gastritis   . History of kidney stones   . History  of stomach ulcers   . Hx of migraines    no migraines since he was in his 68's  . Hypertension   . Peripheral neuropathy 12/26/2017  . Sleep apnea    CPAP    SURGICAL HISTORY: Past Surgical History:  Procedure Laterality Date  . CATARACT EXTRACTION Bilateral 2014  . COLONOSCOPY    . ESOPHAGOGASTRODUODENOSCOPY    . EYE SURGERY    . INGUINAL HERNIA REPAIR Left    x 2  . Rotator cuff surgery Right 2013  . TOTAL HIP ARTHROPLASTY Left 10/27/2015   Procedure: TOTAL HIP ARTHROPLASTY ANTERIOR APPROACH;  Surgeon: Frederik Pear, MD;  Location: [facility];  Service: Orthopedics;  Laterality: Left;    SOCIAL HISTORY: Social History   Socioeconomic History  . Marital status: Married    Spouse name: Not on file  . Number of children: 2  . Years of education: Not on file  . Highest education level: Not on file  Occupational History  . Occupation: Golf course  Social Needs  . Financial resource strain: Not on file  . Food insecurity    Worry: Not on file    Inability: Not on file  . Transportation needs    Medical: Not on file    Non-medical: Not on file  Tobacco Use  . Smoking status: Never Smoker  . Smokeless tobacco: Never Used  Substance and Sexual Activity  . Alcohol use: Yes    Alcohol/week: 1.0 standard drinks    Types: 1 Glasses of wine per week    Comment: daily  . Drug use: No  . Sexual activity: Not on file  Lifestyle  . Physical activity    Days per week: Not on file    Minutes per session: Not on file  . Stress: Not on file  Relationships  . Social Herbalist on phone: Not on file    Gets together: Not on file    Attends religious service: Not on file    Active member of club or organization: Not on file    Attends meetings of clubs or organizations: Not on file    Relationship status: Not on file  . Intimate partner violence    Fear of current or ex partner: Not on file    Emotionally abused: Not on file    Physically abused: Not on file    Forced  sexual activity: Not on file  Other Topics Concern  . Not on file  Social History Narrative   Lives with wife   Caffeine use: 2 cups coffee daily   Gun smith   Right handed     FAMILY HISTORY: Family History  Problem Relation Age of Onset  . Hyperlipidemia Brother   . Stroke Sister 108    ALLERGIES:  has No Known Allergies.  MEDICATIONS:  Current Outpatient Medications  Medication Sig Dispense Refill  . bimatoprost (LUMIGAN) 0.01 % SOLN Place 1  drop into both eyes at bedtime.     . dorzolamide-timolol (COSOPT) 22.3-6.8 MG/ML ophthalmic solution Place 1 drop into both eyes 2 (two) times daily.   8  . hydrochlorothiazide (HYDRODIURIL) 12.5 MG tablet Take 12.5 mg by mouth daily.    . metoprolol succinate (TOPROL-XL) 50 MG 24 hr tablet TAKE 1 TABLET BY MOUTH DAILY WITH OR IMMEDIATELY AFTER A MEAL 90 tablet 3  . Multiple Vitamins-Minerals (MULTIVITAMIN & MINERAL PO) Take 1 tablet by mouth daily.    Marland Kitchen olmesartan (BENICAR) 40 MG tablet Take 40 mg by mouth daily.   4  . Probiotic Product (PROBIOTIC PO) Take 1 tablet by mouth daily.     Alveda Reasons 20 MG TABS tablet TAKE 1 TABLET BY MOUTH WITH SUPPER 90 tablet 1   No current facility-administered medications for this visit.     REVIEW OF SYSTEMS:    A 10+ POINT REVIEW OF SYSTEMS WAS OBTAINED including neurology, dermatology, psychiatry, cardiac, respiratory, lymph, extremities, GI, GU, Musculoskeletal, constitutional, breasts, reproductive, HEENT.  All pertinent positives are noted in the HPI.  All others are negative.   PHYSICAL EXAMINATION: . Vitals:   06/20/19 1444  BP: (!) 152/79  Pulse: 67  Resp: 17  Temp: 98.3 F (36.8 C)  SpO2: 95%   Filed Weights   06/20/19 1444  Weight: 266 lb 6.4 oz (120.8 kg)   .Body mass index is 38.22 kg/m.   GENERAL:alert, in no acute distress and comfortable SKIN: no acute rashes, no significant lesions EYES: conjunctiva are pink and non-injected, sclera anicteric OROPHARYNX: MMM, no  exudates, no oropharyngeal erythema or ulceration NECK: supple, no JVD LYMPH:  no palpable lymphadenopathy in the cervical, axillary or inguinal regions LUNGS: clear to auscultation b/l with normal respiratory effort HEART: regular rate & rhythm ABDOMEN:  normoactive bowel sounds , non tender, not distended. No palpable hepatosplenomegaly.  Extremity: no pedal edema PSYCH: alert & oriented x 3 with fluent speech NEURO: no focal motor/sensory deficits  LABORATORY DATA:  I have reviewed the data as listed  . CBC Latest Ref Rng & Units 06/15/2019 12/08/2018 06/09/2018  WBC 4.0 - 10.5 K/uL 5.0 5.5 5.4  Hemoglobin 13.0 - 17.0 g/dL 15.4 16.3 15.8  Hematocrit 39.0 - 52.0 % 45.9 49.6 47.9  Platelets 150 - 400 K/uL 148(L) 150 145    . CMP Latest Ref Rng & Units 06/15/2019 12/08/2018 06/09/2018  Glucose 70 - 99 mg/dL 117(H) 193(H) 116(H)  BUN 8 - 23 mg/dL 22 17 18   Creatinine 0.61 - 1.24 mg/dL 0.82 0.98 0.82  Sodium 135 - 145 mmol/L 143 140 141  Potassium 3.5 - 5.1 mmol/L 4.0 4.2 4.1  Chloride 98 - 111 mmol/L 107 105 107  CO2 22 - 32 mmol/L 27 24 24   Calcium 8.9 - 10.3 mg/dL 8.8(L) 8.9 9.2  Total Protein 6.5 - 8.1 g/dL 6.7 6.8 7.0  Total Bilirubin 0.3 - 1.2 mg/dL 0.9 0.9 0.9  Alkaline Phos 38 - 126 U/L 62 76 80  AST 15 - 41 U/L 28 36 34  ALT 0 - 44 U/L 29 37 34   02/03/18 BM Report:    02/03/18 Flow Cytometry:   02/03/18 Cytogenetics:    RADIOGRAPHIC STUDIES: I have personally reviewed the radiological images as listed and agreed with the findings in the report. No results found.  ASSESSMENT & PLAN:   78 y.o. male with  1. Light chain MGUS  PET/CT from 02/06/18 did not reveal any bone lesions and revealed Mild low level  FDG uptake throughout the bone marrow is noted.  Labs upon initial presentation from 02/03/18 and 01/24/18, Free kappa lt chains in the urine were elevated at 93.20. No anemia, no abnormal kidney functions. Nonspecific and may be physiologic.  IgM increase noted  without M-spike 02/03/18 BM bx pathology indicated 2% plasma cells 02/03/18 Cytogenetics showed normal genetics without specific mutation Slow moving lymphoma or plasma cell disorder vs reactive to chronic inflammation with frequent sinus and ear infections ?  PLAN: -Discussed pt labwork, 06/15/19; Blood counts are normal, blood chemistries look okay, -Discussed 06/15/2019 K/L light chains is as follows:light chains have improved  -Discussed 06/15/2019 MMP is as follows: MMP continues to be negative for M protein  -SFLC stable over the last 10 months without monoclonal protein, which is reassuring, despite abnormal K:L ratio -Discussed the CRAB criteria. No hypercalcemia, normal renal function, no anemia, no evidence or concern of bone tumors -Could be slow moving lymphoma, plasma cell disorder, or a reactive change in the population of plasma cells -Advised pt that labs do not suggest a plasma cell cancer  -Discussed that continuing watchful observation in light of the stability of his labs and the absence of constitutional symptoms or bone pains, would be quite reasonable- and pt prefers this -If labs or symptoms change, would consider re-evaluation with a repeat BM Bx -Pt will let me or his PCP know if he develops any constitutional symptoms or new, concerning symptoms in the interim -Follow up with PCP for age-appropriate cancer screening and symptom-directed work up -Continue follow up with neurology for management of neuropathy and Vitamin B12 deficiency  -No new heart, kidney, or neuropathy concerns -Continue follow up with cardiology -Will get repeat ECHO in 6 months to evaluate for concerns for AL Amyloidosis. -Will see the pt back in 6 months with labs 1 week prior  FOLLOW UP: ECHO in 24 weeks RTC with Dr Irene Limbo with labs in 6 months Labs 1 week prior to clinic visit  The total time spent in the appt was 15 minutes and more than 50% was on counseling and direct patient cares.  All  of the patient's questions were answered with apparent satisfaction. The patient knows to call the clinic with any problems, questions or concerns.    Sullivan Lone MD [place] AAHIVMS [facility] Hematology/Oncology Physician [facility]  (Office):       [phone] (Work cell):  [phone] (Fax):           [phone]  06/20/2019 4:16 PM  I, Yevette Edwards, am acting as a scribe for Dr. Sullivan Lone.   .I have reviewed the above documentation for accuracy and completeness, and I agree with the above. Brunetta Genera MD

## 2019-06-20 ENCOUNTER — Inpatient Hospital Stay: Payer: Medicare Other | Admitting: Hematology

## 2019-06-20 ENCOUNTER — Other Ambulatory Visit: Payer: Self-pay

## 2019-06-20 ENCOUNTER — Telehealth: Payer: Self-pay | Admitting: Hematology

## 2019-06-20 VITALS — BP 152/79 | HR 67 | Temp 98.3°F | Resp 17 | Ht 70.0 in | Wt 266.4 lb

## 2019-06-20 DIAGNOSIS — R768 Other specified abnormal immunological findings in serum: Secondary | ICD-10-CM

## 2019-06-20 DIAGNOSIS — D472 Monoclonal gammopathy: Secondary | ICD-10-CM | POA: Diagnosis not present

## 2019-06-20 NOTE — Telephone Encounter (Signed)
I talk with patient he will look in my chart for schedule

## 2019-06-22 ENCOUNTER — Ambulatory Visit: Payer: Medicare Other | Admitting: Hematology

## 2019-06-27 ENCOUNTER — Telehealth: Payer: Self-pay | Admitting: *Deleted

## 2019-06-27 NOTE — Telephone Encounter (Signed)
Patient with diagnosis of atrial fibrillation on Xarelto for anticoagulation.    Procedure: blepharoplasty, both upper lids Date of procedure: 07/09/2019  CHADS2-VASc score of  3 (HTN, AGE x 2)  CrCl 131 Platelet count 148  Per office protocol, patient can hold Xarelto for 2 days prior to procedure.    Patient will not need bridging with Lovenox (enoxaparin) around procedure.

## 2019-06-27 NOTE — Telephone Encounter (Signed)
   [facility] Pre-operative Risk Assessment    Request for surgical clearance:  1. What type of surgery is being performed? BLEPHAROPLASTY BOTH UPPER LIDS  2. When is this surgery scheduled? 07/09/2019  3. What type of clearance is required (medical clearance vs. Pharmacy clearance to hold med vs. Both)? BOTH  4. Are there any medications that need to be held prior to surgery and how long? XARELTO 2-3 DAYS PRIOR  5. Practice name and name of physician performing surgery? DR Harrell Gave [facility]  6. What is your office phone number [phone]    7.   What is your office fax number [phone]  8.   Anesthesia type (None, local, MAC, general) ? IV   Fredia Beets 06/27/2019, 4:00 PM  _________________________________________________________________   (provider comments below)

## 2019-06-27 NOTE — Telephone Encounter (Signed)
   Primary Cardiologist: Minus Breeding, MD  Chart reviewed as part of pre-operative protocol coverage. Given past medical history and time since last visit, based on ACC/AHA guidelines, Pressley Devon would be at acceptable risk for the planned procedure without further cardiovascular testing.   Per pharmacy, patient with diagnosis of atrial fibrillation on Xarelto for anticoagulation.    Procedure: blepharoplasty, both upper lids Date of procedure: 07/09/2019  CHADS2-VASc score of  3 (HTN, AGE x 2)  CrCl 131 Platelet count 148  Per office protocol, patient can hold Xarelto for 2 days prior to procedure.    Patient will not need bridging with Lovenox (enoxaparin) around procedure.  I will route this recommendation to the requesting party via Epic fax function and remove from pre-op pool.  Please call with questions.  Kathyrn Drown, NP 06/27/2019, 6:04 PM

## 2019-10-20 ENCOUNTER — Other Ambulatory Visit: Payer: Self-pay | Admitting: Cardiology

## 2019-10-25 ENCOUNTER — Encounter (INDEPENDENT_AMBULATORY_CARE_PROVIDER_SITE_OTHER): Payer: Medicare Other | Admitting: Ophthalmology

## 2019-10-30 ENCOUNTER — Telehealth: Payer: Self-pay

## 2019-10-30 NOTE — Telephone Encounter (Signed)
   Primary Cardiologist:James Hochrein, MD  Chart reviewed as part of pre-operative protocol coverage. Because of Glen Bolton's past medical history and time since last visit, he/she will require a follow-up visit in order to better assess preoperative cardiovascular risk.  Pre-op covering staff: - Please schedule appointment and call patient to inform them. - Please contact requesting surgeon's office via preferred method (i.e, phone, fax) to inform them of need for appointment prior to surgery.  If applicable, this message will also be routed to pharmacy pool and/or primary cardiologist for input on holding anticoagulant/antiplatelet agent as requested below so that this information is available at time of patient's appointment.   Cecilie Kicks, NP  10/30/2019, 2:44 PM

## 2019-10-30 NOTE — Telephone Encounter (Signed)
   [facility] Pre-operative Risk Assessment    Request for surgical clearance:  1. What type of surgery is being performed? RIGHT KNEE ARTHROPLASTY    2. When is this surgery scheduled? TBD   3. What type of clearance is required (medical clearance vs. Pharmacy clearance to hold med vs. Both)? BOTH  4. Are there any medications that need to be held prior to surgery and how long? PT TAKES XARELTO   5. Practice name and name of physician performing surgery? [facility] ATTN: [place]   6. What is your office phone number [phone]    7.   What is your office fax number [phone]  8.   Anesthesia type (None, local, MAC, general) ? SPINAL

## 2019-10-30 NOTE — Telephone Encounter (Signed)
Pt has been scheduled to see Doreene Adas, PA 11/02/19 @ 9:15. Pt aware to arrive by 9 am for registration. Pt aware appt is at [place] office. Pt thanked me for the call and the help. I will send call to PA for upcoming appt. I will send FYI to the surgeon pt has upcoming with cardiology. I will remove from  The pre op call back pool.

## 2019-10-30 NOTE — Telephone Encounter (Signed)
Pt takes Xarelto for afib with CHADS2VASc score of 3 (age x2, HTN). Renal function is normal.  Recommend holding Xarelto for 3 days prior to TKA per protocol.

## 2019-10-30 NOTE — Telephone Encounter (Signed)
Pt needs office appt for clearance - needs EKG  thanks

## 2019-10-30 NOTE — Telephone Encounter (Signed)
Pharm please address xarelto thanks 

## 2019-11-01 NOTE — Progress Notes (Signed)
Cardiology Office Note:    Date:  11/02/2019   ID:  Glade Nurse, DOB 05/23/1941, MRN [id number]  PCP:  Lujean Amel, MD  Cardiologist:  Minus Breeding, MD   Referring MD: Lujean Amel, MD   Chief Complaint  Patient presents with  . Pre-op Exam    History of Present Illness:    Glen Bolton is a 79 y.o. male with a hx of atrial fibrillation anticoagulated with Xarelto, hypertension, and peripheral neuropathy.  He does have sleep apnea but has not been compliant on CPAP due to poorly fitting mask.  He was last seen in clinic on 03/28/2019 by Dr. Percival Spanish and was doing well at that time.  Blood pressure was well controlled on HCTZ, olmesartan, and Toprol.    He presents today for preoperative clearance for right knee replacement. He continues to golf and walk as much as he can - 8000 steps yesterday. His activity is generally limited by his knee pain and neuropathy, but can still complete more than 4.0 METS by my calculation.   He has not been compliant on CPAP because he can't breathe with the nasal mask due to post-nasal drip. He will complete a new sleep study once recovered from knee surgery.    Past Medical History:  Diagnosis Date  . Arthritis   . Atrial fibrillation ([place])   . Dysrhythmia   . GERD (gastroesophageal reflux disease)   . Glaucoma   . Hard of hearing   . History of gastritis   . History of kidney stones   . History of stomach ulcers   . Hx of migraines    no migraines since he was in his 77's  . Hypertension   . Peripheral neuropathy 12/26/2017  . Sleep apnea    CPAP    Past Surgical History:  Procedure Laterality Date  . CATARACT EXTRACTION Bilateral 2014  . COLONOSCOPY    . ESOPHAGOGASTRODUODENOSCOPY    . EYE SURGERY    . INGUINAL HERNIA REPAIR Left    x 2  . Rotator cuff surgery Right 2013  . TOTAL HIP ARTHROPLASTY Left 10/27/2015   Procedure: TOTAL HIP ARTHROPLASTY ANTERIOR APPROACH;  Surgeon: Frederik Pear, MD;  Location: [facility];  Service:  Orthopedics;  Laterality: Left;    Current Medications: Current Meds  Medication Sig  . bimatoprost (LUMIGAN) 0.01 % SOLN Place 1 drop into both eyes at bedtime.   . dorzolamide-timolol (COSOPT) 22.3-6.8 MG/ML ophthalmic solution Place 1 drop into both eyes 2 (two) times daily.   . hydrochlorothiazide (HYDRODIURIL) 12.5 MG tablet Take 12.5 mg by mouth daily.  . metoprolol succinate (TOPROL-XL) 50 MG 24 hr tablet TAKE 1 TABLET BY MOUTH DAILY WITH OR IMMEDIATELY AFTER A MEAL  . Multiple Vitamins-Minerals (MULTIVITAMIN & MINERAL PO) Take 1 tablet by mouth daily.  Marland Kitchen olmesartan (BENICAR) 40 MG tablet Take 40 mg by mouth daily.   . Probiotic Product (PROBIOTIC PO) Take 1 tablet by mouth daily.   Alveda Reasons 20 MG TABS tablet TAKE 1 TABLET BY MOUTH DAILY WITH SUPPER     Allergies:   Patient has no known allergies.   Social History   Socioeconomic History  . Marital status: Married    Spouse name: Not on file  . Number of children: 2  . Years of education: Not on file  . Highest education level: Not on file  Occupational History  . Occupation: Golf course  Tobacco Use  . Smoking status: Never Smoker  . Smokeless tobacco: Never Used  Substance and Sexual Activity  . Alcohol use: Yes    Alcohol/week: 1.0 standard drinks    Types: 1 Glasses of wine per week    Comment: daily  . Drug use: No  . Sexual activity: Not on file  Other Topics Concern  . Not on file  Social History Narrative   Lives with wife   Caffeine use: 2 cups coffee daily   Gun sm[employer]   Right handed    Social Determinants of Health   Financial Resource Strain:   . Difficulty of Paying Living Expenses: Not on file  Food Insecurity:   . Worried About Charity fundraiser in the Last Year: Not on file  . Ran Out of Food in the Last Year: Not on file  Transportation Needs:   . Lack of Transportation (Medical): Not on file  . Lack of Transportation (Non-Medical): Not on file  Physical Activity:   . Days of Exercise  per Week: Not on file  . Minutes of Exercise per Session: Not on file  Stress:   . Feeling of Stress : Not on file  Social Connections:   . Frequency of Communication with Friends and Family: Not on file  . Frequency of Social Gatherings with Friends and Family: Not on file  . Attends Religious Services: Not on file  . Active Member of Clubs or Organizations: Not on file  . Attends Archivist Meetings: Not on file  . Marital Status: Not on file     Family History: The patient's family history includes Hyperlipidemia in his brother; Stroke (age of onset: 66) in his sister.  ROS:   Please see the history of present illness.     All other systems reviewed and are negative.  EKGs/Labs/Other Studies Reviewed:    The following studies were reviewed today:  Echo 09/16/15: - Left ventricle: The cavity size was normal. Wall thickness was  normal. Systolic function was normal. The estimated ejection  fraction was in the range of 55% to 60%. Wall motion was normal;  there were no regional wall motion abnormalities. The study is  not technically sufficient to allow evaluation of LV diastolic  function.  - Aortic valve: Trileaflet; mildly thickened, mildly calcified  leaflets.  - Left atrium: The atrium was moderately dilated.  Anterior-posterior dimension: 51 mm. Volume/bsa, ES, (1-plane  Simpson&'s, A2C): 46.3 ml/m^2.   EKG:  EKG is ordered today.  The ekg ordered today demonstrates atrial fibrillation with ventricular rate 75  Recent Labs: 06/15/2019: ALT 29; BUN 22; Creatinine 0.82; Hemoglobin 15.4; Platelets 148; Potassium 4.0; Sodium 143  Recent Lipid Panel No results found for: CHOL, TRIG, HDL, CHOLHDL, VLDL, LDLCALC, LDLDIRECT  Physical Exam:    VS:  BP 124/64   Pulse 75   Ht 5\' 10"  (1.778 m)   Wt 268 lb (121.6 kg)   BMI 38.45 kg/m     Wt Readings from Last 3 Encounters:  11/02/19 268 lb (121.6 kg)  06/20/19 266 lb 6.4 oz (120.8 kg)  06/06/19  264 lb (119.7 kg)     GEN: obese male in no acute distress HEENT: Normal NECK: No JVD; No carotid bruits LYMPHATICS: No lymphadenopathy CARDIAC: irregular rhythm, regular rate no murmurs, rubs, gallops RESPIRATORY:  Clear to auscultation without rales, wheezing or rhonchi  ABDOMEN: Soft, non-tender, non-distended MUSCULOSKELETAL:  No edema; No deformity  SKIN: Warm and dry NEUROLOGIC:  Alert and oriented x 3 PSYCHIATRIC:  Normal affect   ASSESSMENT:    1. Preoperative  clearance   2. Permanent atrial fibrillation ([place])   3. Chronic anticoagulation   4. Essential hypertension   5. Claudication ([place])   6. Decreased pedal pulses   7. Cold extremities   8. Obstructive sleep apnea syndrome    PLAN:    In order of problems listed above:  PAF Chronic anticoagulation - EKG with rate controlled atrial fibrillation today - no palpitations, no syncope  - doing well on xarelto This patients CHA2DS2-VASc Score and unadjusted Ischemic Stroke Rate (% per year) is equal to 4.8 % stroke rate/year from a score of 4 (age2, HTN, DM)   Essential hypertension - continue present medications   OSA not on CPAP - not compliance because he can't breathe through the nasal cannulae  - discussed with Mariann Laster, will need to restart with a new sleep study - he would like to defer this until after his knee surgery - put in a recall to call him in 3 months for sleep study   Leg and foot pain, possible claudication Cool extremities, skin discoloration Weak pedal pulse on right - will collect ABIs, - difficult to tell if this is neuropathy or PAD   Preoperative clearance for He has no history of CAD, MI, or stroke. He does not take insulin and has normal renal function. He can complete more than 4.0 METS without angina. According to the RCRI, he is low risk for major cardiac complication during the perioperative period.   Per our clinical pharmacist: Pt takes Xarelto for afib with CHADS2VASc  score of 3 (age x2, HTN). Renal function is normal.  Recommend holding Xarelto for 3 days prior to TKA per protocol  I will fax this note to [facility]. ABIs should not hold up his knee surgery.   Medication Adjustments/Labs and Tests Ordered: Current medicines are reviewed at length with the patient today.  Concerns regarding medicines are outlined above.  Orders Placed This Encounter  Procedures  . Split night study  . VAS [place] ABI WITH/WO TBI  . VAS [place] LOWER EXTREMITY ARTERIAL DUPLEX   No orders of the defined types were placed in this encounter.   Signed, Ledora Bottcher, [place]  11/02/2019 12:17 PM    [facility]

## 2019-11-02 ENCOUNTER — Encounter: Payer: Self-pay | Admitting: Physician Assistant

## 2019-11-02 ENCOUNTER — Ambulatory Visit (INDEPENDENT_AMBULATORY_CARE_PROVIDER_SITE_OTHER): Payer: Medicare Other | Admitting: Physician Assistant

## 2019-11-02 VITALS — BP 124/64 | HR 75 | Ht 70.0 in | Wt 268.0 lb

## 2019-11-02 DIAGNOSIS — R209 Unspecified disturbances of skin sensation: Secondary | ICD-10-CM

## 2019-11-02 DIAGNOSIS — Z01818 Encounter for other preprocedural examination: Secondary | ICD-10-CM

## 2019-11-02 DIAGNOSIS — I10 Essential (primary) hypertension: Secondary | ICD-10-CM

## 2019-11-02 DIAGNOSIS — R0989 Other specified symptoms and signs involving the circulatory and respiratory systems: Secondary | ICD-10-CM

## 2019-11-02 DIAGNOSIS — Z7901 Long term (current) use of anticoagulants: Secondary | ICD-10-CM | POA: Diagnosis not present

## 2019-11-02 DIAGNOSIS — I739 Peripheral vascular disease, unspecified: Secondary | ICD-10-CM

## 2019-11-02 DIAGNOSIS — R6889 Other general symptoms and signs: Secondary | ICD-10-CM

## 2019-11-02 DIAGNOSIS — I4821 Permanent atrial fibrillation: Secondary | ICD-10-CM | POA: Diagnosis not present

## 2019-11-02 DIAGNOSIS — G4733 Obstructive sleep apnea (adult) (pediatric): Secondary | ICD-10-CM

## 2019-11-02 NOTE — Patient Instructions (Signed)
Medication Instructions:  Your physician recommends that you continue on your current medications as directed. Please refer to the Current Medication list given to you today.  *If you need a refill on your cardiac medications before your next appointment, please call your pharmacy*    Testing/Procedures: Your physician has requested that you have a lower extremity arterial duplex. During this test, ultrasound is used to evaluate arterial blood flow in the legs. Allow one hour for this exam. There are no restrictions or special instructions.  Your physician has requested that you have an ankle brachial index (ABI). During this test an ultrasound and blood pressure cuff are used to evaluate the arteries that supply the arms and legs with blood. Allow thirty minutes for this exam. There are no restrictions or special instructions.   Your physician has recommended that you have a sleep study in 3 months (May 2021). Please call our office to schedule this. This test records several body functions during sleep, including: brain activity, eye movement, oxygen and carbon dioxide blood levels, heart rate and rhythm, breathing rate and rhythm, the flow of air through your mouth and nose, snoring, body muscle movements, and chest and belly movement.     Follow-Up: At [facility], you and your health needs are our priority.  As part of our continuing mission to provide you with exceptional heart care, we have created designated Provider Care Teams.  These Care Teams include your primary Cardiologist (physician) and Advanced Practice Providers (APPs -  Physician Assistants and Nurse Practitioners) who all work together to provide you with the care you need, when you need it.  We recommend signing up for the patient portal called "MyChart".  Sign up information is provided on this After Visit Summary.  MyChart is used to connect with patients for Virtual Visits (Telemedicine).  Patients are able to view  lab/test results, encounter notes, upcoming appointments, etc.  Non-urgent messages can be sent to your provider as well.   To learn more about what you can do with MyChart, go to [email or web address].    Your next appointment:   12 month(s)  The format for your next appointment:   In Person  Provider:   Minus Breeding, MD   Other Instructions Please call our office to schedule an appointment 2 months in advance.

## 2019-11-09 ENCOUNTER — Other Ambulatory Visit (INDEPENDENT_AMBULATORY_CARE_PROVIDER_SITE_OTHER): Payer: Medicare Other

## 2019-11-09 DIAGNOSIS — I10 Essential (primary) hypertension: Secondary | ICD-10-CM

## 2019-11-13 ENCOUNTER — Other Ambulatory Visit: Payer: Self-pay | Admitting: Family Medicine

## 2019-11-13 ENCOUNTER — Ambulatory Visit
Admission: RE | Admit: 2019-11-13 | Discharge: 2019-11-13 | Disposition: A | Discharge status: Home / Self Care | Payer: Medicare Other | Source: Ambulatory Visit | Attending: Family Medicine | Admitting: Family Medicine

## 2019-11-13 DIAGNOSIS — Z01818 Encounter for other preprocedural examination: Secondary | ICD-10-CM

## 2019-11-14 ENCOUNTER — Other Ambulatory Visit: Payer: Self-pay

## 2019-11-14 ENCOUNTER — Ambulatory Visit: Payer: Medicare Other | Attending: Family Medicine | Admitting: Physical Therapy

## 2019-11-14 DIAGNOSIS — R2681 Unsteadiness on feet: Secondary | ICD-10-CM

## 2019-11-14 DIAGNOSIS — R42 Dizziness and giddiness: Secondary | ICD-10-CM | POA: Diagnosis present

## 2019-11-14 DIAGNOSIS — H8111 Benign paroxysmal vertigo, right ear: Secondary | ICD-10-CM

## 2019-11-14 NOTE — Therapy (Signed)
[facility] [address] [place], [place], [address] Phone: [phone]   Fax:  [phone]  Physical Therapy Evaluation  Patient Details  Name: Glen Bolton MRN: [id number] Date of Birth: Jul 12, 1941 Referring Provider (PT): Lujean Amel, MD   Encounter Date: 11/14/2019  PT End of Session - 11/14/19 1111    Visit Number  1    Number of Visits  5    Date for PT Re-Evaluation  01/13/20   but anticipate D/C at 4 weeks   Authorization Type  [employer] Medicare - $35 copay; 10th visit PN    Progress Note Due on Visit  10    PT Start Time  1020    PT Stop Time  1058    PT Time Calculation (min)  38 min    Activity Tolerance  Patient tolerated treatment well    Behavior During Therapy  [facility] for tasks assessed/performed       Past Medical History:  Diagnosis Date  . Arthritis   . Atrial fibrillation ([place])   . Dysrhythmia   . GERD (gastroesophageal reflux disease)   . Glaucoma   . Hard of hearing   . History of gastritis   . History of kidney stones   . History of stomach ulcers   . Hx of migraines    no migraines since he was in his 43's  . Hypertension   . Peripheral neuropathy 12/26/2017  . Sleep apnea    CPAP    Past Surgical History:  Procedure Laterality Date  . CATARACT EXTRACTION Bilateral 2014  . COLONOSCOPY    . ESOPHAGOGASTRODUODENOSCOPY    . EYE SURGERY    . INGUINAL HERNIA REPAIR Left    x 2  . Rotator cuff surgery Right 2013  . TOTAL HIP ARTHROPLASTY Left 10/27/2015   Procedure: TOTAL HIP ARTHROPLASTY ANTERIOR APPROACH;  Surgeon: Frederik Pear, MD;  Location: [facility];  Service: Orthopedics;  Laterality: Left;    There were no vitals filed for this visit.   Subjective Assessment - 11/14/19 1024    Subjective  Has been dealing with vertigo for a while, was here a couple of years ago to work on it.  Is getting ready to have TKA and wants to take care of the vertigo so he has good balance when he has  the knee replacement.  May have to move the first of April to a 1st level handicap accessible apartment.    Pertinent History  h/o BPPV, OA, atrial fibrillation, dysrhythmia, GERD, glaucoma, hearing loss, h/o kidney stones, h/o stomach ulcers, h/o migraines, HTN, peripheral neuropathy, OSA, cataract extraction, R rotator cuff surgery, L THA    Patient Stated Goals  to get vertigo under control before undergoing TKA    Currently in Pain?  Yes   knee pain - not addressed by this PT encounter        [facility] PT Assessment - 11/14/19 1029      Assessment   Medical Diagnosis  vertigo    Referring Provider (PT)  Lujean Amel, MD    Onset Date/Surgical Date  11/09/19    Prior Therapy  yes, orthopedic and vestibular      Precautions   Precautions  Other (comment)    Precaution Comments  OA, atrial fibrillation, dysrhythmia, GERD, glaucoma, hearing loss, h/o kidney stones, h/o stomach ulcers, h/o migraines, HTN, peripheral neuropathy, OSA, cataract extraction, R rotator cuff surgery, L THA      Balance Screen   Has the  patient fallen in the past 6 months  Yes    How many times?  1   LOB, tipped forwards and hit [place] residence    Living Arrangements  Spouse/significant other    Additional Comments  Driving      Prior Function   Level of Independence  Independent    Leisure  golfing      Observation/Other Assessments   Focus on Therapeutic Outcomes (FOTO)   Not captured      Sensation   Light Touch  Impaired Detail    Hot/Cold  Impaired by gross assessment    Additional Comments  N&T, burning, pins and needles in feet due to neuropathy           Vestibular Assessment - 11/14/19 1032      Vestibular Assessment   General Observation  Came for vestibular rehab 2 years ago; has had vestibular rehab multiple times in the past with good results.        Symptom Behavior   Subjective history of current problem  denies nausea or  vomiting; denies tinnitus; has used hearing aides x 6 years.  No dizziness when walking.    Type of Dizziness   Spinning    Frequency of Dizziness  intermittent    Duration of Dizziness  seconds    Symptom Nature  Positional;Motion provoked    Aggravating Factors  Lying supine;Rolling to right;Rolling to left;Forward bending;Sit to stand    Relieving Factors  Slow movements;Closing eyes    Progression of Symptoms  No change since onset      Oculomotor Exam   Oculomotor Alignment  Normal    Ocular ROM  WNL    Spontaneous  Absent    Gaze-induced   Absent    Smooth Pursuits  Intact    Saccades  Intact      Oculomotor Exam-Fixation Suppressed    Left Head Impulse  + to left    Right Head Impulse  negative      Vestibulo-Ocular Reflex   VOR to Slow Head Movement  Normal    VOR Cancellation  Normal      Positional Testing   Dix-Hallpike  Dix-Hallpike Right;Dix-Hallpike Left    Horizontal Canal Testing  Horizontal Canal Right;Horizontal Canal Left      Dix-Hallpike Right   Dix-Hallpike Right Duration  3 seconds; 20 seconds second assessment    Dix-Hallpike Right Symptoms  Upbeat, right rotatory nystagmus      Dix-Hallpike Left   Dix-Hallpike Left Duration  0    Dix-Hallpike Left Symptoms  No nystagmus      Horizontal Canal Right   Horizontal Canal Right Duration  1 second    Horizontal Canal Right Symptoms  Nystagmus   R rotary, brief     Horizontal Canal Left   Horizontal Canal Left Duration  0    Horizontal Canal Left Symptoms  Normal          Objective measurements completed on examination: See above findings.       Vestibular Treatment/Exercise - 11/14/19 1048      Vestibular Treatment/Exercise   Vestibular Treatment Provided  Canalith Repositioning    Canalith Repositioning  Epley Manuever Right       EPLEY MANUEVER RIGHT   Number of Reps   2    Overall Response  Improved Symptoms            PT Education - 11/14/19  1111    Education Details   clinical findings, PT POC and goals    Person(s) Educated  Patient    Methods  Explanation    Comprehension  Verbalized understanding          PT Long Term Goals - 11/14/19 1137      PT LONG TERM GOAL #1   Title  Pt will demonstrate independence with HEP    Time  4    Period  Weeks    Status  New    Target Date  12/14/19      PT LONG TERM GOAL #2   Title  Pt will report no dizziness with rolling over in bed and supine <> sit    Time  4    Period  Weeks    Status  New    Target Date  12/14/19      PT LONG TERM GOAL #3   Title  Pt will demonstrate negative positional testing    Baseline  R posterior canal BPPV    Time  4    Period  Weeks    Status  New    Target Date  12/14/19      PT LONG TERM GOAL #4   Title  Pt will demonstrate ability to lean forwards to reach for objects without dizziness or LOB    Time  4    Period  Weeks    Status  New    Target Date  12/14/19             Plan - 11/14/19 1113    Clinical Impression Statement  Pt is a 79 year old male referred to Neuro OPPT for evaluation of acute on chronic vertigo.  Pt's PMH is significant for the following: BPPV, fall, OA, atrial fibrillation, dysrhythmia, GERD, glaucoma, hearing loss, h/o kidney stones, h/o stomach ulcers, h/o migraines, HTN, peripheral neuropathy, OSA, cataract extraction, R rotator cuff surgery, L THA. The following deficits were noted during pt's exam: impaired VOR, vertigo and R rotary, upbeating nystagmus of short duration during R hallpike-dix indicating R posterior canal canalithiasis and impaired balance placing patient at increased risk for falls.  Pt would benefit from skilled PT to address these impairments and functional limitations to maximize functional mobility independence and reduce falls risk prior to TKA surgery.    Personal Factors and Comorbidities  Comorbidity 3+;Fitness;Past/Current Experience    Comorbidities  BPPV, fall, OA, atrial fibrillation, dysrhythmia, GERD,  glaucoma, hearing loss, h/o kidney stones, h/o stomach ulcers, h/o migraines, HTN, peripheral neuropathy, OSA, cataract extraction, R rotator cuff surgery, L THA    Examination-Activity Limitations  Bed Mobility;Bend;Stand    Stability/Clinical Decision Making  Stable/Uncomplicated    Clinical Decision Making  Low    Rehab Potential  Good    PT Frequency  1x / week    PT Duration  8 weeks   POC 8 weeks but anticipate D/C at 4 weeks   PT Treatment/Interventions  ADLs/Self Care Home Management;Canalith Repostioning;Functional mobility training;Therapeutic activities;Therapeutic exercise;Balance training;Neuromuscular re-education;Patient/family education;Vestibular    PT Next Visit Plan  reassess and treat R BPPV as needed, initiate HEP for x1 viewing and balance, bending down to the floor    Consulted and Agree with Plan of Care  Patient       Patient will benefit from skilled therapeutic intervention in order to improve the following deficits and impairments:  Decreased balance, Dizziness  Visit Diagnosis: BPPV (benign paroxysmal positional vertigo), right  Unsteadiness on feet  Dizziness and giddiness     Problem List Patient Active Problem List   Diagnosis Date Noted  . Essential hypertension 12/04/2018  . Sleep apnea 12/04/2018  . Chronic anticoagulation 12/04/2018  . IgM lambda monoclonal gammopathy 01/25/2018  . Peripheral neuropathy 12/26/2017  . Primary osteoarthritis of left hip 10/25/2015  . Atrial fibrillation, chronic 08/26/2015    Rico Junker, PT, DPT 11/14/19    11:43 AM    [facility] [address] [place], [place], [address] Phone: [phone]   Fax:  [phone]  Name: Glen Bolton MRN: [id number] Date of Birth: 1940/12/23

## 2019-11-22 ENCOUNTER — Ambulatory Visit: Payer: Medicare Other | Admitting: Physical Therapy

## 2019-11-22 ENCOUNTER — Encounter: Payer: Self-pay | Admitting: Physical Therapy

## 2019-11-22 ENCOUNTER — Other Ambulatory Visit: Payer: Self-pay

## 2019-11-22 DIAGNOSIS — R42 Dizziness and giddiness: Secondary | ICD-10-CM

## 2019-11-22 DIAGNOSIS — R2681 Unsteadiness on feet: Secondary | ICD-10-CM

## 2019-11-22 DIAGNOSIS — H8111 Benign paroxysmal vertigo, right ear: Secondary | ICD-10-CM | POA: Diagnosis not present

## 2019-11-22 NOTE — Therapy (Signed)
[facility] [address] [place], [place], [address] Phone: [phone]   Fax:  [phone]  Physical Therapy Treatment  Patient Details  Name: Glen Bolton MRN: [id number] Date of Birth: 06/29/1941 Referring Provider (PT): Lujean Amel, MD   Encounter Date: 11/22/2019  PT End of Session - 11/22/19 1011    Visit Number  2    Number of Visits  5    Date for PT Re-Evaluation  01/13/20   but anticipate D/C at 4 weeks   Authorization Type  [employer] Medicare - $35 copay; 10th visit PN    Progress Note Due on Visit  10    PT Start Time  0936    PT Stop Time  1011    PT Time Calculation (min)  35 min    Activity Tolerance  Patient tolerated treatment well    Behavior During Therapy  [facility] for tasks assessed/performed       Past Medical History:  Diagnosis Date  . Arthritis   . Atrial fibrillation ([place])   . Dysrhythmia   . GERD (gastroesophageal reflux disease)   . Glaucoma   . Hard of hearing   . History of gastritis   . History of kidney stones   . History of stomach ulcers   . Hx of migraines    no migraines since he was in his 63's  . Hypertension   . Peripheral neuropathy 12/26/2017  . Sleep apnea    CPAP    Past Surgical History:  Procedure Laterality Date  . CATARACT EXTRACTION Bilateral 2014  . COLONOSCOPY    . ESOPHAGOGASTRODUODENOSCOPY    . EYE SURGERY    . INGUINAL HERNIA REPAIR Left    x 2  . Rotator cuff surgery Right 2013  . TOTAL HIP ARTHROPLASTY Left 10/27/2015   Procedure: TOTAL HIP ARTHROPLASTY ANTERIOR APPROACH;  Surgeon: Frederik Pear, MD;  Location: [facility];  Service: Orthopedics;  Laterality: Left;    There were no vitals filed for this visit.  Subjective Assessment - 11/22/19 0939    Subjective  The day after the eval pt woke up very dizzy and off balance.  Hasn't been that way since then.  Pt has a lot going on - is moving out by the end of the month.  Was only able to take today's  appointment.    Pertinent History  h/o BPPV, OA, atrial fibrillation, dysrhythmia, GERD, glaucoma, hearing loss, h/o kidney stones, h/o stomach ulcers, h/o migraines, HTN, peripheral neuropathy, OSA, cataract extraction, R rotator cuff surgery, L THA    Patient Stated Goals  to get vertigo under control before undergoing TKA    Currently in Pain?  No/denies             Vestibular Assessment - 11/22/19 0942      Positional Testing   Dix-Hallpike  Dix-Hallpike Right      Dix-Hallpike Right   Dix-Hallpike Right Duration  10 seconds, 10 seconds second assessment    Dix-Hallpike Right Symptoms  Upbeat, right rotatory nystagmus                Vestibular Treatment/Exercise - 11/22/19 0951      Vestibular Treatment/Exercise   Vestibular Treatment Provided  Canalith Repositioning;Gaze    Canalith Repositioning  Epley Manuever Right    Gaze Exercises  X1 Viewing Horizontal;X1 Viewing Vertical       EPLEY MANUEVER RIGHT   Number of Reps   2    Overall Response  Symptoms Resolved      X1 Viewing Horizontal   Foot Position  standing feet apart    Reps  2    Comments  30 > 60 seconds; no symptoms      X1 Viewing Vertical   Foot Position  standing feet apart    Reps  2    Comments  30 > 60 seconds, no symptoms            PT Education - 11/22/19 1011    Education Details  no HEP at this time, will leave episode open if pt has return of vertigo, will close after a couple of months if no return of vertigo    Person(s) Educated  Patient    Methods  Explanation    Comprehension  Verbalized understanding          PT Long Term Goals - 11/14/19 1137      PT LONG TERM GOAL #1   Title  Pt will demonstrate independence with HEP    Time  4    Period  Weeks    Status  New    Target Date  12/14/19      PT LONG TERM GOAL #2   Title  Pt will report no dizziness with rolling over in bed and supine <> sit    Time  4    Period  Weeks    Status  New    Target Date   12/14/19      PT LONG TERM GOAL #3   Title  Pt will demonstrate negative positional testing    Baseline  R posterior canal BPPV    Time  4    Period  Weeks    Status  New    Target Date  12/14/19      PT LONG TERM GOAL #4   Title  Pt will demonstrate ability to lean forwards to reach for objects without dizziness or LOB    Time  4    Period  Weeks    Status  New    Target Date  12/14/19            Plan - 11/22/19 1013    Clinical Impression Statement  Pt continued to present with R upbeating nystagmus with hallpike-dix testing - treated x 2 with CRM with full resolution of symptoms.  Initiated x1 viewing for gaze adaptation and VOR training but pt reported no symptoms in standing x 1 minute so not added to HEP right now.  Pt not scheduled to return to therapy right now due to resolution of symptoms and pt is very busy with moving and upcoming surgery.  PT to leave episode open for a couple of months in case of reoccurence of vertigo; after two months if pt continues to not have symptoms PT will proceed with D/C.  Pt agreeable.    Personal Factors and Comorbidities  Comorbidity 3+;Fitness;Past/Current Experience    Comorbidities  BPPV, fall, OA, atrial fibrillation, dysrhythmia, GERD, glaucoma, hearing loss, h/o kidney stones, h/o stomach ulcers, h/o migraines, HTN, peripheral neuropathy, OSA, cataract extraction, R rotator cuff surgery, L THA    Examination-Activity Limitations  Bed Mobility;Bend;Stand    Stability/Clinical Decision Making  Stable/Uncomplicated    Rehab Potential  Good    PT Frequency  1x / week    PT Duration  8 weeks   POC 8 weeks but anticipate D/C at 4 weeks   PT Treatment/Interventions  ADLs/Self Care Home Management;Canalith Repostioning;Functional mobility training;Therapeutic  activities;Therapeutic exercise;Balance training;Neuromuscular re-education;Patient/family education;Vestibular    PT Next Visit Plan  D/C if pt does not return, if returns, reassess R  BPPV    Consulted and Agree with Plan of Care  Patient       Patient will benefit from skilled therapeutic intervention in order to improve the following deficits and impairments:  Decreased balance, Dizziness  Visit Diagnosis: BPPV (benign paroxysmal positional vertigo), right  Dizziness and giddiness  Unsteadiness on feet     Problem List Patient Active Problem List   Diagnosis Date Noted  . Essential hypertension 12/04/2018  . Sleep apnea 12/04/2018  . Chronic anticoagulation 12/04/2018  . IgM lambda monoclonal gammopathy 01/25/2018  . Peripheral neuropathy 12/26/2017  . Primary osteoarthritis of left hip 10/25/2015  . Atrial fibrillation, chronic 08/26/2015    Rico Junker, PT, DPT 11/22/19    10:17 AM    [facility] [address] [place], [place], [address] Phone: [phone]   Fax:  [phone]  Name: Glen Bolton MRN: [id number] Date of Birth: May 29, 1941

## 2019-12-03 ENCOUNTER — Other Ambulatory Visit: Payer: Self-pay | Admitting: Orthopedic Surgery

## 2019-12-05 ENCOUNTER — Other Ambulatory Visit: Payer: Self-pay

## 2019-12-05 ENCOUNTER — Ambulatory Visit (HOSPITAL_COMMUNITY)
Admission: RE | Admit: 2019-12-05 | Discharge: 2019-12-05 | Disposition: A | Discharge status: Home / Self Care | Payer: Medicare Other | Source: Ambulatory Visit | Attending: Hematology | Admitting: Hematology

## 2019-12-05 DIAGNOSIS — R768 Other specified abnormal immunological findings in serum: Secondary | ICD-10-CM

## 2019-12-05 DIAGNOSIS — I482 Chronic atrial fibrillation, unspecified: Secondary | ICD-10-CM | POA: Insufficient documentation

## 2019-12-05 DIAGNOSIS — I10 Essential (primary) hypertension: Secondary | ICD-10-CM | POA: Diagnosis not present

## 2019-12-05 DIAGNOSIS — D472 Monoclonal gammopathy: Secondary | ICD-10-CM

## 2019-12-05 NOTE — Progress Notes (Signed)
  Echocardiogram 2D Echocardiogram has been performed.  Lorri Fukuhara A Alanson Hausmann 12/05/2019, 11:58 AM

## 2019-12-11 NOTE — Progress Notes (Signed)
PCP - [place] Cardiologist - [facility] clearance 11-02-19 epic   Chest x-ray - 11-13-19 epic EKG - 11-02-19 epic Stress Test -  ECHO - 12-05-19 epic Cardiac Cath -   Sleep Study -  CPAP - yes  Fasting Blood Sugar -  Checks Blood Sugar _____ times a day  Blood Thinner Instructions:Xarelto hold 3 days prior to surgery Aspirin Instructions: Last Dose:  Anesthesia review: osa  Does not tolerate cpap due to post nasal drip trying something different end of April or May per pt. , a fib, borderline DM, ptt 46, PT 19.2  Patient denies shortness of breath, fever, cough and chest pain at PAT appointment   NONE   Patient verbalized understanding of instructions that were given to them at the PAT appointment. Patient was also instructed that they will need to review over the PAT instructions again at home before surgery.

## 2019-12-11 NOTE — Patient Instructions (Addendum)
DUE TO COVID-19 ONLY ONE VISITOR IS ALLOWED TO COME WITH YOU AND STAY IN THE WAITING ROOM ONLY DURING PRE OP AND PROCEDURE DAY OF SURGERY. TWO VISITOR MAY VISIT WITH YOU AFTER SURGERY IN YOUR PRIVATE ROOM DURING VISITING HOURS ONLY!  10-a 8p  YOU NEED TO HAVE A COVID 19 TEST ON_4-8-21______ @__11 :25 am _____, THIS TEST MUST BE DONE BEFORE SURGERY, COME  [place], [place] , [address].  ([facility]) ONCE YOUR COVID TEST IS COMPLETED, PLEASE BEGIN THE QUARANTINE INSTRUCTIONS AS OUTLINED IN YOUR HANDOUT.                Dequane Gobeil  12/11/2019   Your procedure is scheduled on: 12-17-19   Report to [facility] Main  Entrance   Report to admitting at     0930  AM     Call this number if you have problems the morning of surgery [phone]    Remember: NO SOLID FOOD AFTER MIDNIGHT THE NIGHT PRIOR TO SURGERY. NOTHING BY MOUTH EXCEPT CLEAR LIQUIDS UNTIL   0900 am . PLEASE FINISH G2  DRINK PER SURGEON ORDER  WHICH NEEDS TO BE COMPLETED AT        0900 am then nothing by mouth .    CLEAR LIQUID DIET   Foods Allowed                                                                                Foods Excluded  Coffee and tea, regular and decaf  No creamer                                    liquids that you cannot  Plain Jell-O any favor except red or purple                                           see through such as: Fruit ices (not with fruit pulp)                                                                     milk, soups, orange juice  Iced Popsicles                                                                All solid food Carbonated beverages, regular and diet                                    Cranberry,  grape and apple juices Sports drinks like Gatorade Lightly seasoned clear broth or consume(fat free) Sugar, honey syrup  _____________________________________________________________________    BRUSH YOUR TEETH MORNING OF SURGERY AND RINSE  YOUR MOUTH OUT, NO CHEWING GUM CANDY OR MINTS.     Take these medicines the morning of surgery with A SIP OF WATER: metoprolol, eye drops as usual                                  You may not have any metal on your body including hair pins and              piercings  Do not wear jewelry,  lotions, powders or perfumes, deodorant              Men may shave face and neck.   Do not bring valuables to the hospital. [place].  Contacts, dentures or bridgework may not be worn into surgery.     _____________________________________________________________________           [facility] - Preparing for Surgery Before surgery, you can play an important role.  Because skin is not sterile, your skin needs to be as free of germs as possible.  You can reduce the number of germs on your skin by washing with CHG (chlorahexidine gluconate) soap before surgery.  CHG is an antiseptic cleaner which kills germs and bonds with the skin to continue killing germs even after washing. Please DO NOT use if you have an allergy to CHG or antibacterial soaps.  If your skin becomes reddened/irritated stop using the CHG and inform your nurse when you arrive at Short Stay. Do not shave (including legs and underarms) for at least 48 hours prior to the first CHG shower.  You may shave your face/neck. Please follow these instructions carefully:  1.  Shower with CHG Soap the night before surgery and the  morning of Surgery.  2.  If you choose to wash your hair, wash your hair first as usual with your  normal  shampoo.  3.  After you shampoo, rinse your hair and body thoroughly to remove the  shampoo.                           4.  Use CHG as you would any other liquid soap.  You can apply chg directly  to the skin and wash                       Gently with a scrungie or clean washcloth.  5.  Apply the CHG Soap to your body ONLY FROM THE NECK DOWN.   Do not use on face/ open                            Wound or open sores. Avoid contact with eyes, ears mouth and genitals (private parts).                       Wash face,  Genitals (private parts) with your normal soap.             6.  Wash thoroughly, paying special attention to the area where your surgery  will  be performed.  7.  Thoroughly rinse your body with warm water from the neck down.  8.  DO NOT shower/wash with your normal soap after using and rinsing off  the CHG Soap.                9.  Pat yourself dry with a clean towel.            10.  Wear clean pajamas.            11.  Place clean sheets on your bed the night of your first shower and do not  sleep with pets. Day of Surgery : Do not apply any lotions/deodorants the morning of surgery.  Please wear clean clothes to the hospital/surgery center.  FAILURE TO FOLLOW THESE INSTRUCTIONS MAY RESULT IN THE CANCELLATION OF YOUR SURGERY PATIENT SIGNATURE_________________________________  NURSE SIGNATURE__________________________________  ________________________________________________________________________   Adam Phenix  An incentive spirometer is a tool that can help keep your lungs clear and active. This tool measures how well you are filling your lungs with each breath. Taking long deep breaths may help reverse or decrease the chance of developing breathing (pulmonary) problems (especially infection) following:  A long period of time when you are unable to move or be active. BEFORE THE PROCEDURE   If the spirometer includes an indicator to show your best effort, your nurse or respiratory therapist will set it to a desired goal.  If possible, sit up straight or lean slightly forward. Try not to slouch.  Hold the incentive spirometer in an upright position. INSTRUCTIONS FOR USE  1. Sit on the edge of your bed if possible, or sit up as far as you can in bed or on a chair. 2. Hold the incentive spirometer in an upright position. 3. Breathe out  normally. 4. Place the mouthpiece in your mouth and seal your lips tightly around it. 5. Breathe in slowly and as deeply as possible, raising the piston or the ball toward the top of the column. 6. Hold your breath for 3-5 seconds or for as long as possible. Allow the piston or ball to fall to the bottom of the column. 7. Remove the mouthpiece from your mouth and breathe out normally. 8. Rest for a few seconds and repeat Steps 1 through 7 at least 10 times every 1-2 hours when you are awake. Take your time and take a few normal breaths between deep breaths. 9. The spirometer may include an indicator to show your best effort. Use the indicator as a goal to work toward during each repetition. 10. After each set of 10 deep breaths, practice coughing to be sure your lungs are clear. If you have an incision (the cut made at the time of surgery), support your incision when coughing by placing a pillow or rolled up towels firmly against it. Once you are able to get out of bed, walk around indoors and cough well. You may stop using the incentive spirometer when instructed by your caregiver.  RISKS AND COMPLICATIONS  Take your time so you do not get dizzy or light-headed.  If you are in pain, you may need to take or ask for pain medication before doing incentive spirometry. It is harder to take a deep breath if you are having pain. AFTER USE  Rest and breathe slowly and easily.  It can be helpful to keep track of a log of your progress. Your caregiver can provide you with a simple table to help with this. If  you are using the spirometer at home, follow these instructions: [place] IF:   You are having difficultly using the spirometer.  You have trouble using the spirometer as often as instructed.  Your pain medication is not giving enough relief while using the spirometer.  You develop fever of 100.5 F (38.1 C) or higher. SEEK IMMEDIATE MEDICAL CARE IF:   You cough up bloody sputum  that had not been present before.  You develop fever of 102 F (38.9 C) or greater.  You develop worsening pain at or near the incision site. MAKE SURE YOU:   Understand these instructions.  Will watch your condition.  Will get help right away if you are not doing well or get worse. Document Released: 01/03/2007 Document Revised: 11/15/2011 Document Reviewed: 03/06/2007 Exit[employer] Patient Information 2014 ExitCare, [place].   ________________________________________________________________________  WHAT IS A BLOOD TRANSFUSION? Blood Transfusion Information  A transfusion is the replacement of blood or some of its parts. Blood is made up of multiple cells which provide different functions.  Red blood cells carry oxygen and are used for blood loss replacement.  White blood cells fight against infection.  Platelets control bleeding.  Plasma helps clot blood.  Other blood products are available for specialized needs, such as hemophilia or other clotting disorders. BEFORE THE TRANSFUSION  Who gives blood for transfusions?   Healthy volunteers who are fully evaluated to make sure their blood is safe. This is blood bank blood. Transfusion therapy is the safest it has ever been in the practice of medicine. Before blood is taken from a donor, a complete history is taken to make sure that person has no history of diseases nor engages in risky social behavior (examples are intravenous drug use or sexual activity with multiple partners). The donor's travel history is screened to minimize risk of transmitting infections, such as malaria. The donated blood is tested for signs of infectious diseases, such as HIV and hepatitis. The blood is then tested to be sure it is compatible with you in order to minimize the chance of a transfusion reaction. If you or a relative donates blood, this is often done in anticipation of surgery and is not appropriate for emergency situations. It takes many days to  process the donated blood. RISKS AND COMPLICATIONS Although transfusion therapy is very safe and saves many lives, the main dangers of transfusion include:   Getting an infectious disease.  Developing a transfusion reaction. This is an allergic reaction to something in the blood you were given. Every precaution is taken to prevent this. The decision to have a blood transfusion has been considered carefully by your caregiver before blood is given. Blood is not given unless the benefits outweigh the risks. AFTER THE TRANSFUSION  Right after receiving a blood transfusion, you will usually feel much better and more energetic. This is especially true if your red blood cells have gotten low (anemic). The transfusion raises the level of the red blood cells which carry oxygen, and this usually causes an energy increase.  The nurse administering the transfusion will monitor you carefully for complications. HOME CARE INSTRUCTIONS  No special instructions are needed after a transfusion. You may find your energy is better. Speak with your caregiver about any limitations on activity for underlying diseases you may have. SEEK MEDICAL CARE IF:   Your condition is not improving after your transfusion.  You develop redness or irritation at the intravenous (IV) site. SEEK IMMEDIATE MEDICAL CARE IF:  Any of the  following symptoms occur over the next 12 hours:  Shaking chills.  You have a temperature by mouth above 102 F (38.9 C), not controlled by medicine.  Chest, back, or muscle pain.  People around you feel you are not acting correctly or are confused.  Shortness of breath or difficulty breathing.  Dizziness and fainting.  You get a rash or develop hives.  You have a decrease in urine output.  Your urine turns a dark color or changes to pink, red, or brown. Any of the following symptoms occur over the next 10 days:  You have a temperature by mouth above 102 F (38.9 C), not controlled by  medicine.  Shortness of breath.  Weakness after normal activity.  The white part of the eye turns yellow (jaundice).  You have a decrease in the amount of urine or are urinating less often.  Your urine turns a dark color or changes to pink, red, or brown. Document Released: 08/20/2000 Document Revised: 11/15/2011 Document Reviewed: 04/08/2008 [facility] Patient Information 2014 [place], [place].  _______________________________________________________________________

## 2019-12-12 ENCOUNTER — Other Ambulatory Visit: Payer: Self-pay

## 2019-12-12 ENCOUNTER — Encounter (HOSPITAL_COMMUNITY)
Admission: RE | Admit: 2019-12-12 | Discharge: 2019-12-12 | Disposition: A | Discharge status: Home / Self Care | Payer: Medicare Other | Source: Ambulatory Visit | Attending: Orthopedic Surgery | Admitting: Orthopedic Surgery

## 2019-12-12 ENCOUNTER — Inpatient Hospital Stay: Payer: Medicare Other | Attending: Hematology

## 2019-12-12 ENCOUNTER — Encounter (HOSPITAL_COMMUNITY): Payer: Self-pay

## 2019-12-12 DIAGNOSIS — Z96642 Presence of left artificial hip joint: Secondary | ICD-10-CM | POA: Insufficient documentation

## 2019-12-12 DIAGNOSIS — Z8711 Personal history of peptic ulcer disease: Secondary | ICD-10-CM | POA: Diagnosis not present

## 2019-12-12 DIAGNOSIS — I10 Essential (primary) hypertension: Secondary | ICD-10-CM | POA: Diagnosis not present

## 2019-12-12 DIAGNOSIS — Z01818 Encounter for other preprocedural examination: Secondary | ICD-10-CM | POA: Diagnosis not present

## 2019-12-12 DIAGNOSIS — Z7901 Long term (current) use of anticoagulants: Secondary | ICD-10-CM | POA: Diagnosis not present

## 2019-12-12 DIAGNOSIS — R768 Other specified abnormal immunological findings in serum: Secondary | ICD-10-CM

## 2019-12-12 DIAGNOSIS — G4733 Obstructive sleep apnea (adult) (pediatric): Secondary | ICD-10-CM | POA: Diagnosis not present

## 2019-12-12 DIAGNOSIS — I4891 Unspecified atrial fibrillation: Secondary | ICD-10-CM | POA: Insufficient documentation

## 2019-12-12 DIAGNOSIS — M1711 Unilateral primary osteoarthritis, right knee: Secondary | ICD-10-CM | POA: Diagnosis not present

## 2019-12-12 DIAGNOSIS — Z79899 Other long term (current) drug therapy: Secondary | ICD-10-CM | POA: Diagnosis not present

## 2019-12-12 DIAGNOSIS — Z87442 Personal history of urinary calculi: Secondary | ICD-10-CM | POA: Insufficient documentation

## 2019-12-12 DIAGNOSIS — D472 Monoclonal gammopathy: Secondary | ICD-10-CM | POA: Diagnosis not present

## 2019-12-12 HISTORY — DX: Anxiety disorder, unspecified: F41.9

## 2019-12-12 HISTORY — DX: Prediabetes: R73.03

## 2019-12-12 HISTORY — DX: Pneumonia, unspecified organism: J18.9

## 2019-12-12 LAB — CBC WITH DIFFERENTIAL/PLATELET
Abs Immature Granulocytes: 0.02 10*3/uL (ref 0.00–0.07)
Basophils Absolute: 0 10*3/uL (ref 0.0–0.1)
Basophils Relative: 0 %
Eosinophils Absolute: 0.3 10*3/uL (ref 0.0–0.5)
Eosinophils Relative: 4 %
HCT: 48.2 % (ref 39.0–52.0)
Hemoglobin: 15.5 g/dL (ref 13.0–17.0)
Immature Granulocytes: 0 %
Lymphocytes Relative: 21 %
Lymphs Abs: 1.5 10*3/uL (ref 0.7–4.0)
MCH: 31.1 pg (ref 26.0–34.0)
MCHC: 32.2 g/dL (ref 30.0–36.0)
MCV: 96.8 fL (ref 80.0–100.0)
Monocytes Absolute: 1 10*3/uL (ref 0.1–1.0)
Monocytes Relative: 15 %
Neutro Abs: 4.1 10*3/uL (ref 1.7–7.7)
Neutrophils Relative %: 60 %
Platelets: 161 10*3/uL (ref 150–400)
RBC: 4.98 MIL/uL (ref 4.22–5.81)
RDW: 13.2 % (ref 11.5–15.5)
WBC: 6.8 10*3/uL (ref 4.0–10.5)
nRBC: 0 % (ref 0.0–0.2)

## 2019-12-12 LAB — BASIC METABOLIC PANEL
Anion gap: 8 (ref 5–15)
BUN: 22 mg/dL (ref 8–23)
CO2: 27 mmol/L (ref 22–32)
Calcium: 9.5 mg/dL (ref 8.9–10.3)
Chloride: 105 mmol/L (ref 98–111)
Creatinine, Ser: 0.8 mg/dL (ref 0.61–1.24)
GFR calc Af Amer: >60 mL/min (ref >60)
GFR calc non Af Amer: >60 mL/min (ref >60)
Glucose, Bld: 114 mg/dL — ABNORMAL HIGH (ref 70–99)
Potassium: 4.2 mmol/L (ref 3.5–5.1)
Sodium: 140 mmol/L (ref 135–145)

## 2019-12-12 LAB — SURGICAL PCR SCREEN
MRSA, PCR: NEGATIVE
Staphylococcus aureus: POSITIVE — AB

## 2019-12-12 LAB — APTT: aPTT: 46 seconds — ABNORMAL HIGH (ref 24–36)

## 2019-12-12 LAB — URINALYSIS, ROUTINE W REFLEX MICROSCOPIC
Bilirubin Urine: NEGATIVE
Glucose, UA: NEGATIVE mg/dL
Hgb urine dipstick: NEGATIVE
Ketones, ur: NEGATIVE mg/dL
Leukocytes,Ua: NEGATIVE
Nitrite: NEGATIVE
Protein, ur: NEGATIVE mg/dL
Specific Gravity, Urine: 1.02 (ref 1.005–1.030)
pH: 6 (ref 5.0–8.0)

## 2019-12-12 LAB — ABO/RH: ABO/RH(D): A POS

## 2019-12-12 LAB — PROTIME-INR
INR: 1.6 — ABNORMAL HIGH (ref 0.8–1.2)
Prothrombin Time: 19.2 seconds — ABNORMAL HIGH (ref 11.4–15.2)

## 2019-12-13 ENCOUNTER — Other Ambulatory Visit (HOSPITAL_COMMUNITY)
Admission: RE | Admit: 2019-12-13 | Discharge: 2019-12-13 | Disposition: A | Discharge status: Home / Self Care | Payer: Medicare Other | Source: Ambulatory Visit | Attending: Orthopedic Surgery | Admitting: Orthopedic Surgery

## 2019-12-13 DIAGNOSIS — M1711 Unilateral primary osteoarthritis, right knee: Secondary | ICD-10-CM | POA: Diagnosis present

## 2019-12-13 DIAGNOSIS — Z20822 Contact with and (suspected) exposure to covid-19: Secondary | ICD-10-CM | POA: Insufficient documentation

## 2019-12-13 DIAGNOSIS — Z01812 Encounter for preprocedural laboratory examination: Secondary | ICD-10-CM | POA: Insufficient documentation

## 2019-12-13 HISTORY — DX: Unilateral primary osteoarthritis, right knee: M17.11

## 2019-12-13 LAB — HEMOGLOBIN A1C
Hgb A1c MFr Bld: 6.1 % — ABNORMAL HIGH (ref 4.8–5.6)
Mean Plasma Glucose: 128 mg/dL

## 2019-12-13 LAB — MULTIPLE MYELOMA PANEL, SERUM
Albumin SerPl Elph-Mcnc: 3.7 g/dL (ref 2.9–4.4)
Albumin/Glob SerPl: 1.4 (ref 0.7–1.7)
Alpha 1: 0.2 g/dL (ref 0.0–0.4)
Alpha2 Glob SerPl Elph-Mcnc: 0.7 g/dL (ref 0.4–1.0)
B-Globulin SerPl Elph-Mcnc: 0.8 g/dL (ref 0.7–1.3)
Gamma Glob SerPl Elph-Mcnc: 0.9 g/dL (ref 0.4–1.8)
Globulin, Total: 2.7 g/dL (ref 2.2–3.9)
IgA: 119 mg/dL (ref 61–437)
IgG (Immunoglobin G), Serum: 738 mg/dL (ref 603–1613)
IgM (Immunoglobulin M), Srm: 230 mg/dL — ABNORMAL HIGH (ref 15–143)
Total Protein ELP: 6.4 g/dL (ref 6.0–8.5)

## 2019-12-13 LAB — KAPPA/LAMBDA LIGHT CHAINS
Kappa free light chain: 163 mg/L — ABNORMAL HIGH (ref 3.3–19.4)
Kappa, lambda light chain ratio: 9.21 — ABNORMAL HIGH (ref 0.26–1.65)
Lambda free light chains: 17.7 mg/L (ref 5.7–26.3)

## 2019-12-13 LAB — SARS CORONAVIRUS 2 (TAT 6-24 HRS): SARS Coronavirus 2: NEGATIVE

## 2019-12-13 NOTE — H&P (Signed)
TOTAL KNEE ADMISSION H&P  Patient is being admitted for right total knee arthroplasty.  Subjective:  Chief Complaint:right knee pain.  HPI: Glen Bolton, 79 y.o. male, has a history of pain and functional disability in the right knee due to arthritis and has failed non-surgical conservative treatments for greater than 12 weeks to includeNSAID's and/or analgesics, corticosteriod injections, flexibility and strengthening excercises, use of assistive devices, weight reduction as appropriate and activity modification.  Onset of symptoms was gradual, starting several years ago with gradually worsening course since that time. The patient noted no past surgery on the right knee(s).  Patient currently rates pain in the right knee(s) at 10 out of 10 with activity. Patient has night pain, worsening of pain with activity and weight bearing, pain that interferes with activities of daily living, pain with passive range of motion, crepitus and joint swelling.  Patient has evidence of subchondral sclerosis, periarticular osteophytes, joint subluxation and joint space narrowing by imaging studies.   There is no active infection.  Patient Active Problem List   Diagnosis Date Noted  . Essential hypertension 12/04/2018  . Sleep apnea 12/04/2018  . Chronic anticoagulation 12/04/2018  . IgM lambda monoclonal gammopathy 01/25/2018  . Peripheral neuropathy 12/26/2017  . Primary osteoarthritis of left hip 10/25/2015  . Atrial fibrillation, chronic 08/26/2015   Past Medical History:  Diagnosis Date  . Anxiety   . Arthritis   . Atrial fibrillation ([place])   . Borderline diabetic   . Dysrhythmia   . GERD (gastroesophageal reflux disease)   . Glaucoma   . Hard of hearing   . History of gastritis   . History of kidney stones   . History of stomach ulcers   . Hx of migraines    no migraines since he was in his 94's  . Hypertension   . Peripheral neuropathy 12/26/2017  . Pneumonia   . Sleep apnea    CPAP  not  currently working  couldnt breathe with it laying down trying something different end of  April or May 2021    Past Surgical History:  Procedure Laterality Date  . CATARACT EXTRACTION Bilateral 2014  . COLONOSCOPY    . ESOPHAGOGASTRODUODENOSCOPY    . EYE SURGERY    . INGUINAL HERNIA REPAIR Left    x 2  . Rotator cuff surgery Right 2013  . TOTAL HIP ARTHROPLASTY Left 10/27/2015   Procedure: TOTAL HIP ARTHROPLASTY ANTERIOR APPROACH;  Surgeon: Frederik Pear, MD;  Location: [place];  Service: Orthopedics;  Laterality: Left;    No current facility-administered medications for this encounter.   Current Outpatient Medications  Medication Sig Dispense Refill Last Dose  . dorzolamide-timolol (COSOPT) 22.3-6.8 MG/ML ophthalmic solution Place 1 drop into both eyes 2 (two) times daily.   8   . hydrochlorothiazide (HYDRODIURIL) 12.5 MG tablet Take 12.5 mg by mouth every evening.      . metoprolol succinate (TOPROL-XL) 50 MG 24 hr tablet TAKE 1 TABLET BY MOUTH DAILY WITH OR IMMEDIATELY AFTER A MEAL (Patient taking differently: Take 50 mg by mouth daily. ) 90 tablet 3   . Multiple Vitamin (MULTIVITAMIN WITH MINERALS) TABS tablet Take 1 tablet by mouth daily.     Marland Kitchen olmesartan (BENICAR) 40 MG tablet Take 40 mg by mouth daily.   4   . Probiotic Product (PROBIOTIC DAILY PO) Take 1 capsule by mouth every evening.     Alveda Reasons 20 MG TABS tablet TAKE 1 TABLET BY MOUTH DAILY WITH SUPPER (Patient taking differently: Take  20 mg by mouth every evening. ) 90 tablet 1    No Known Allergies  Social History   Tobacco Use  . Smoking status: Never Smoker  . Smokeless tobacco: Never Used  Substance Use Topics  . Alcohol use: Yes    Alcohol/week: 1.0 standard drinks    Types: 1 Glasses of wine per week    Comment: daily    Family History  Problem Relation Age of Onset  . Hyperlipidemia Brother   . Stroke Sister 27     Review of Systems  Constitutional: Negative.   HENT: Positive for sinus pain.   Eyes:  Negative.   Respiratory: Negative.   Cardiovascular:       HTN and irregular heart beat  Gastrointestinal: Positive for abdominal pain.  Endocrine: Negative.   Genitourinary:       ED  Musculoskeletal: Positive for arthralgias.  Allergic/Immunologic: Negative.   Neurological: Negative.   Hematological: Negative.   Psychiatric/Behavioral: Negative.     Objective:  Physical Exam  Constitutional: He is oriented to person, place, and time. He appears well-developed and well-nourished.  HENT:  Head: Normocephalic and atraumatic.  Eyes: Pupils are equal, round, and reactive to light.  Cardiovascular: Intact distal pulses.  Respiratory: Effort normal.  Musculoskeletal:        General: Tenderness present.     Cervical back: Normal range of motion and neck supple.     Comments: Range of motion right knee 5/115 tender along the medial joint line 5 varus deformity collateral ligaments are stable quadriceps and hamstring power crepitus as you take her through range of motion on the medial side.  Toes are pink and well perfused normal sensation of the feet.  Neurological: He is alert and oriented to person, place, and time.  Skin: Skin is warm and dry.  Psychiatric: He has a normal mood and affect. His behavior is normal. Judgment and thought content normal.    Vital signs in last 24 hours:    Labs:   Estimated body mass index is 38.51 kg/m as calculated from the following:   Height as of 12/12/19: 5\' 10"  (1.778 m).   Weight as of 12/12/19: 121.7 kg.   Imaging Review Plain radiographs demonstrate  AP lateral Rosenberg and sunrise x-rays show bone-on-bone needle compartment arthritis of the right knee peripheral osteophytes early subluxation of tibia lateral to femur.      Assessment/Plan:  End stage arthritis, right knee   The patient history, physical examination, clinical judgment of the provider and imaging studies are consistent with end stage degenerative joint disease  of the right knee(s) and total knee arthroplasty is deemed medically necessary. The treatment options including medical management, injection therapy arthroscopy and arthroplasty were discussed at length. The risks and benefits of total knee arthroplasty were presented and reviewed. The risks due to aseptic loosening, infection, stiffness, patella tracking problems, thromboembolic complications and other imponderables were discussed. The patient acknowledged the explanation, agreed to proceed with the plan and consent was signed. Patient is being admitted for inpatient treatment for surgery, pain control, PT, OT, prophylactic antibiotics, VTE prophylaxis, progressive ambulation and ADL's and discharge planning. The patient is planning to be discharged home with home health services     Patient's anticipated LOS is less than 2 midnights, meeting these requirements: - Younger than 63 - Lives within 1 hour of care - Has a competent adult at home to recover with post-op recover - NO history of  - Chronic pain requiring opiods  -  Diabetes  - Coronary Artery Disease  - Heart failure  - Heart attack  - Stroke  - DVT/VTE  - Cardiac arrhythmia  - Respiratory Failure/COPD  - Renal failure  - Anemia  - Advanced Liver disease

## 2019-12-14 NOTE — Anesthesia Preprocedure Evaluation (Addendum)
Anesthesia Evaluation  Patient identified by MRN, date of birth, ID band Patient awake    Reviewed: Allergy & Precautions, NPO status , Patient's Chart, lab work & pertinent test results, reviewed documented beta blocker date and time   History of Anesthesia Complications Negative for: history of anesthetic complications  Airway Mallampati: II  TM Distance: >3 FB Neck ROM: Full    Dental  (+) Chipped,    Pulmonary sleep apnea (untreated) ,    Pulmonary exam normal        Cardiovascular hypertension, Pt. on home beta blockers and Pt. on medications Normal cardiovascular exam+ dysrhythmias (on Xarelto, last dose 4 days ago) Atrial Fibrillation      Neuro/Psych Anxiety negative neurological ROS     GI/Hepatic Neg liver ROS, GERD  ,  Endo/Other  negative endocrine ROS  Renal/GU negative Renal ROS  negative genitourinary   Musculoskeletal  (+) Arthritis ,   Abdominal   Peds  Hematology negative hematology ROS (+)   Anesthesia Other Findings Glaucoma  Reproductive/Obstetrics negative OB ROS                            Anesthesia Physical Anesthesia Plan  ASA: III  Anesthesia Plan: Spinal   Post-op Pain Management:    Induction:   PONV Risk Score and Plan: 2 and Treatment may vary due to age or medical condition, Ondansetron and Dexamethasone  Airway Management Planned: Natural Airway and Simple Face Mask  Additional Equipment: None  Intra-op Plan:   Post-operative Plan:   Informed Consent: I have reviewed the patients History and Physical, chart, labs and discussed the procedure including the risks, benefits and alternatives for the proposed anesthesia with the patient or authorized representative who has indicated his/her understanding and acceptance.       Plan Discussed with: CRNA  Anesthesia Plan Comments:        Anesthesia Quick Evaluation

## 2019-12-14 NOTE — Progress Notes (Signed)
Anesthesia Chart Review   Case: [id number] Date/Time: 12/17/19 1145   Procedure: RIGHT TOTAL KNEE ARTHROPLASTY (Right Knee)   Anesthesia type: Spinal   Pre-op diagnosis: RIGHT KNEE OSETOARTHRITIS   Location: [place] / [facility]   Surgeons: Frederik Pear, MD      DISCUSSION:78 y.o. never smoker with h/o HTN, GERD, sleep apnea, A-fib (on Xarelto), right knee OA scheduled for above procedure 12/17/19 with Dr. Frederik Pear.   Pt last seen by cardiology 11/02/2019.  Per OV note, "He has no history of CAD, MI, or stroke. He does not take insulin and has normal renal function. He can complete more than 4.0 METS without angina. According to the RCRI, he is low risk for major cardiac complication during the perioperative period. Recommend holding Xarelto for 3 days prior to TKA per protocol."  Anticipate pt can proceed with planned procedure barring acute status change.   VS: BP (!) 165/84   Pulse 71   Temp 36.9 C (Oral)   Resp 18   Ht 5\' 10"  (1.778 m)   Wt 121.7 kg   SpO2 99%   BMI 38.51 kg/m   PROVIDERS: Koirala, Dibas, MD is PCP   Minus Breeding, MD is Cardiologist  LABS: Labs reviewed: Acceptable for surgery. (all labs ordered are listed, but only abnormal results are displayed)  Labs Reviewed  SURGICAL PCR SCREEN - Abnormal; Notable for the following components:      Result Value   Staphylococcus aureus POSITIVE (*)    All other components within normal limits  HEMOGLOBIN A1C - Abnormal; Notable for the following components:   Hgb A1c MFr Bld 6.1 (*)    All other components within normal limits  APTT - Abnormal; Notable for the following components:   aPTT 46 (*)    All other components within normal limits  BASIC METABOLIC PANEL - Abnormal; Notable for the following components:   Glucose, Bld 114 (*)    All other components within normal limits  PROTIME-INR - Abnormal; Notable for the following components:   Prothrombin Time 19.2 (*)    INR 1.6 (*)    All other components  within normal limits  CBC WITH DIFFERENTIAL/PLATELET  URINALYSIS, ROUTINE W REFLEX MICROSCOPIC  TYPE AND SCREEN  ABO/RH     IMAGES:   EKG: 12/12/2019 Rate 68 bpm Atrial fibrillation  Abnormal ECG   CV: Echo 12/05/2019 IMPRESSIONS    1. Left ventricular ejection fraction, by estimation, is 60 to 65%. The  left ventricle has normal function. The left ventricle has no regional  wall motion abnormalities. Left ventricular diastolic function could not  be evaluated.  2. Right ventricular systolic function is mildly reduced. The right  ventricular size is normal. There is moderately elevated pulmonary artery  systolic pressure.  3. Left atrial size was moderately dilated.  4. Right atrial size was mildly dilated.  5. The mitral valve is normal in structure. Trivial mitral valve  regurgitation. No evidence of mitral stenosis.  6. The aortic valve is tricuspid. Aortic valve regurgitation is not  visualized. No aortic stenosis is present.  7. The inferior vena cava is normal in size with <50% respiratory  variability, suggesting right atrial pressure of 8 mmHg.   Comparison(s): No significant change from prior study. Past Medical History:  Diagnosis Date  . Anxiety   . Arthritis   . Atrial fibrillation ([place])   . Borderline diabetic   . Dysrhythmia   . GERD (gastroesophageal reflux disease)   . Glaucoma   .  Hard of hearing   . History of gastritis   . History of kidney stones   . History of stomach ulcers   . Hx of migraines    no migraines since he was in his 53's  . Hypertension   . Peripheral neuropathy 12/26/2017  . Pneumonia   . Sleep apnea    CPAP  not currently working  couldnt breathe with it laying down trying something different end of  April or May 2021    Past Surgical History:  Procedure Laterality Date  . CATARACT EXTRACTION Bilateral 2014  . COLONOSCOPY    . ESOPHAGOGASTRODUODENOSCOPY    . EYE SURGERY    . INGUINAL HERNIA REPAIR Left    x 2   . Rotator cuff surgery Right 2013  . TOTAL HIP ARTHROPLASTY Left 10/27/2015   Procedure: TOTAL HIP ARTHROPLASTY ANTERIOR APPROACH;  Surgeon: Frederik Pear, MD;  Location: [place];  Service: Orthopedics;  Laterality: Left;    MEDICATIONS: . dorzolamide-timolol (COSOPT) 22.3-6.8 MG/ML ophthalmic solution  . hydrochlorothiazide (HYDRODIURIL) 12.5 MG tablet  . metoprolol succinate (TOPROL-XL) 50 MG 24 hr tablet  . Multiple Vitamin (MULTIVITAMIN WITH MINERALS) TABS tablet  . olmesartan (BENICAR) 40 MG tablet  . Probiotic Product (PROBIOTIC DAILY PO)  . XARELTO 20 MG TABS tablet   No current facility-administered medications for this encounter.   Maia Plan [facility] Pre-Surgical Testing [phone] 12/14/19 10:51 AM

## 2019-12-16 MED ORDER — BUPIVACAINE LIPOSOME 1.3 % IJ SUSP
20.0000 mL | INTRAMUSCULAR | Status: DC
  Filled 2019-12-16: qty 20

## 2019-12-16 MED ORDER — DEXTROSE 5 % IV SOLN
3.0000 g | INTRAVENOUS | Status: AC
  Administered 2019-12-17: 3 g via INTRAVENOUS
  Filled 2019-12-16: qty 3

## 2019-12-16 MED ORDER — TRANEXAMIC ACID 1000 MG/10ML IV SOLN
2000.0000 mg | INTRAVENOUS | Status: DC
  Filled 2019-12-16: qty 20

## 2019-12-17 ENCOUNTER — Encounter (HOSPITAL_COMMUNITY)
Admission: RE | Disposition: A | Discharge status: Home / Self Care | Payer: Self-pay | Source: Home / Self Care | Attending: Orthopedic Surgery

## 2019-12-17 ENCOUNTER — Ambulatory Visit (HOSPITAL_COMMUNITY): Payer: Medicare Other | Admitting: Registered Nurse

## 2019-12-17 ENCOUNTER — Encounter (HOSPITAL_COMMUNITY): Payer: Self-pay | Admitting: Orthopedic Surgery

## 2019-12-17 ENCOUNTER — Observation Stay (HOSPITAL_COMMUNITY)
Admission: RE | Admit: 2019-12-17 | Discharge: 2019-12-18 | Disposition: A | Discharge status: Home / Self Care | Payer: Medicare Other | Attending: Orthopedic Surgery | Admitting: Orthopedic Surgery

## 2019-12-17 ENCOUNTER — Ambulatory Visit (HOSPITAL_COMMUNITY): Payer: Medicare Other | Admitting: Physician Assistant

## 2019-12-17 ENCOUNTER — Other Ambulatory Visit: Payer: Self-pay

## 2019-12-17 DIAGNOSIS — Z8719 Personal history of other diseases of the digestive system: Secondary | ICD-10-CM | POA: Diagnosis not present

## 2019-12-17 DIAGNOSIS — F419 Anxiety disorder, unspecified: Secondary | ICD-10-CM | POA: Insufficient documentation

## 2019-12-17 DIAGNOSIS — Z8711 Personal history of peptic ulcer disease: Secondary | ICD-10-CM | POA: Insufficient documentation

## 2019-12-17 DIAGNOSIS — Z96651 Presence of right artificial knee joint: Secondary | ICD-10-CM

## 2019-12-17 DIAGNOSIS — K219 Gastro-esophageal reflux disease without esophagitis: Secondary | ICD-10-CM | POA: Insufficient documentation

## 2019-12-17 DIAGNOSIS — I10 Essential (primary) hypertension: Secondary | ICD-10-CM | POA: Diagnosis not present

## 2019-12-17 DIAGNOSIS — E1142 Type 2 diabetes mellitus with diabetic polyneuropathy: Secondary | ICD-10-CM | POA: Insufficient documentation

## 2019-12-17 DIAGNOSIS — Z7901 Long term (current) use of anticoagulants: Secondary | ICD-10-CM | POA: Diagnosis not present

## 2019-12-17 DIAGNOSIS — M1711 Unilateral primary osteoarthritis, right knee: Principal | ICD-10-CM | POA: Insufficient documentation

## 2019-12-17 DIAGNOSIS — Z8349 Family history of other endocrine, nutritional and metabolic diseases: Secondary | ICD-10-CM | POA: Diagnosis not present

## 2019-12-17 DIAGNOSIS — D472 Monoclonal gammopathy: Secondary | ICD-10-CM | POA: Insufficient documentation

## 2019-12-17 DIAGNOSIS — E1139 Type 2 diabetes mellitus with other diabetic ophthalmic complication: Secondary | ICD-10-CM | POA: Insufficient documentation

## 2019-12-17 DIAGNOSIS — Z79899 Other long term (current) drug therapy: Secondary | ICD-10-CM | POA: Insufficient documentation

## 2019-12-17 DIAGNOSIS — G4730 Sleep apnea, unspecified: Secondary | ICD-10-CM | POA: Insufficient documentation

## 2019-12-17 DIAGNOSIS — I482 Chronic atrial fibrillation, unspecified: Secondary | ICD-10-CM | POA: Diagnosis not present

## 2019-12-17 DIAGNOSIS — Z87442 Personal history of urinary calculi: Secondary | ICD-10-CM | POA: Insufficient documentation

## 2019-12-17 DIAGNOSIS — Z01818 Encounter for other preprocedural examination: Secondary | ICD-10-CM

## 2019-12-17 DIAGNOSIS — Z9841 Cataract extraction status, right eye: Secondary | ICD-10-CM | POA: Insufficient documentation

## 2019-12-17 DIAGNOSIS — Z9842 Cataract extraction status, left eye: Secondary | ICD-10-CM | POA: Insufficient documentation

## 2019-12-17 DIAGNOSIS — H42 Glaucoma in diseases classified elsewhere: Secondary | ICD-10-CM | POA: Diagnosis not present

## 2019-12-17 DIAGNOSIS — Z96642 Presence of left artificial hip joint: Secondary | ICD-10-CM | POA: Insufficient documentation

## 2019-12-17 DIAGNOSIS — G8929 Other chronic pain: Secondary | ICD-10-CM | POA: Insufficient documentation

## 2019-12-17 DIAGNOSIS — Z823 Family history of stroke: Secondary | ICD-10-CM | POA: Diagnosis not present

## 2019-12-17 HISTORY — PX: TOTAL KNEE ARTHROPLASTY: SHX125

## 2019-12-17 LAB — TYPE AND SCREEN
ABO/RH(D): A POS
Antibody Screen: NEGATIVE

## 2019-12-17 SURGERY — ARTHROPLASTY, KNEE, TOTAL
Anesthesia: Spinal | Site: Knee | Laterality: Right

## 2019-12-17 MED ORDER — HYDROCHLOROTHIAZIDE 25 MG PO TABS
12.5000 mg | ORAL_TABLET | Freq: Every evening | ORAL | Status: DC
  Administered 2019-12-17: 12.5 mg via ORAL
  Filled 2019-12-17: qty 1

## 2019-12-17 MED ORDER — FENTANYL CITRATE (PF) 100 MCG/2ML IJ SOLN
INTRAMUSCULAR | Status: AC
  Filled 2019-12-17: qty 2

## 2019-12-17 MED ORDER — FENTANYL CITRATE (PF) 100 MCG/2ML IJ SOLN
INTRAMUSCULAR | Status: DC | PRN
  Administered 2019-12-17: 100 ug via INTRAVENOUS

## 2019-12-17 MED ORDER — PHENYLEPHRINE 40 MCG/ML (10ML) SYRINGE FOR IV PUSH (FOR BLOOD PRESSURE SUPPORT)
PREFILLED_SYRINGE | INTRAVENOUS | Status: AC
  Filled 2019-12-17: qty 10

## 2019-12-17 MED ORDER — DIPHENHYDRAMINE HCL 12.5 MG/5ML PO ELIX: 12.5000 mg | ORAL_SOLUTION | ORAL | Status: DC | PRN

## 2019-12-17 MED ORDER — TRANEXAMIC ACID 1000 MG/10ML IV SOLN
INTRAVENOUS | Status: DC | PRN
  Administered 2019-12-17: 2000 mg via TOPICAL

## 2019-12-17 MED ORDER — PHENOL 1.4 % MT LIQD: 1.0000 | OROMUCOSAL | Status: DC | PRN

## 2019-12-17 MED ORDER — HYDROMORPHONE HCL 1 MG/ML IJ SOLN: 0.5000 mg | INTRAMUSCULAR | Status: DC | PRN

## 2019-12-17 MED ORDER — METOCLOPRAMIDE HCL 5 MG/ML IJ SOLN: 5.0000 mg | Freq: Three times a day (TID) | INTRAMUSCULAR | Status: DC | PRN

## 2019-12-17 MED ORDER — TRANEXAMIC ACID-NACL 1000-0.7 MG/100ML-% IV SOLN
1000.0000 mg | INTRAVENOUS | Status: AC
  Administered 2019-12-17: 13:00:00 1000 mg via INTRAVENOUS
  Filled 2019-12-17: qty 100

## 2019-12-17 MED ORDER — METHOCARBAMOL 500 MG IVPB - SIMPLE MED
500.0000 mg | Freq: Four times a day (QID) | INTRAVENOUS | Status: DC | PRN
  Filled 2019-12-17: qty 50

## 2019-12-17 MED ORDER — CELECOXIB 200 MG PO CAPS
200.0000 mg | ORAL_CAPSULE | Freq: Two times a day (BID) | ORAL | Status: DC
  Administered 2019-12-17 – 2019-12-18 (×2): 200 mg via ORAL
  Filled 2019-12-17 (×2): qty 1

## 2019-12-17 MED ORDER — RISAQUAD PO CAPS
1.0000 | ORAL_CAPSULE | Freq: Every day | ORAL | Status: DC
  Administered 2019-12-17 – 2019-12-18 (×2): 1 via ORAL
  Filled 2019-12-17 (×2): qty 1

## 2019-12-17 MED ORDER — FLEET ENEMA 7-19 GM/118ML RE ENEM: 1.0000 | ENEMA | Freq: Once | RECTAL | Status: DC | PRN

## 2019-12-17 MED ORDER — MIDAZOLAM HCL 2 MG/2ML IJ SOLN
1.0000 mg | INTRAMUSCULAR | Status: DC
  Filled 2019-12-17: qty 2

## 2019-12-17 MED ORDER — TIZANIDINE HCL 2 MG PO TABS: 2.0000 mg | ORAL_TABLET | Freq: Four times a day (QID) | ORAL | 0 refills | Status: DC | PRN

## 2019-12-17 MED ORDER — PHENYLEPHRINE HCL-NACL 10-0.9 MG/250ML-% IV SOLN
INTRAVENOUS | Status: DC | PRN
  Administered 2019-12-17: 50 ug/min via INTRAVENOUS

## 2019-12-17 MED ORDER — ADULT MULTIVITAMIN W/MINERALS CH
1.0000 | ORAL_TABLET | Freq: Every day | ORAL | Status: DC
  Administered 2019-12-17 – 2019-12-18 (×2): 1 via ORAL
  Filled 2019-12-17 (×2): qty 1

## 2019-12-17 MED ORDER — ACETAMINOPHEN 500 MG PO TABS
1000.0000 mg | ORAL_TABLET | Freq: Once | ORAL | Status: AC
  Administered 2019-12-17: 10:00:00 1000 mg via ORAL
  Filled 2019-12-17: qty 2

## 2019-12-17 MED ORDER — OXYCODONE HCL 5 MG/5ML PO SOLN: 5.0000 mg | Freq: Once | ORAL | Status: DC | PRN

## 2019-12-17 MED ORDER — DEXAMETHASONE SODIUM PHOSPHATE 10 MG/ML IJ SOLN
INTRAMUSCULAR | Status: DC | PRN
  Administered 2019-12-17: 10 mg via INTRAVENOUS

## 2019-12-17 MED ORDER — LACTATED RINGERS IV SOLN
INTRAVENOUS | Status: DC
  Administered 2019-12-17: 1000 mL via INTRAVENOUS

## 2019-12-17 MED ORDER — ONDANSETRON HCL 4 MG PO TABS: 4.0000 mg | ORAL_TABLET | Freq: Four times a day (QID) | ORAL | Status: DC | PRN

## 2019-12-17 MED ORDER — POLYETHYLENE GLYCOL 3350 17 G PO PACK: 17.0000 g | PACK | Freq: Every day | ORAL | Status: DC | PRN

## 2019-12-17 MED ORDER — RIVAROXABAN 10 MG PO TABS
10.0000 mg | ORAL_TABLET | Freq: Every day | ORAL | Status: DC
  Administered 2019-12-18: 10 mg via ORAL
  Filled 2019-12-17: qty 1

## 2019-12-17 MED ORDER — ALUM & MAG HYDROXIDE-SIMETH 200-200-20 MG/5ML PO SUSP: 30.0000 mL | ORAL | Status: DC | PRN

## 2019-12-17 MED ORDER — FENTANYL CITRATE (PF) 100 MCG/2ML IJ SOLN
50.0000 ug | INTRAMUSCULAR | Status: DC
  Administered 2019-12-17: 50 ug via INTRAVENOUS
  Filled 2019-12-17: qty 2

## 2019-12-17 MED ORDER — ONDANSETRON HCL 4 MG/2ML IJ SOLN
INTRAMUSCULAR | Status: DC | PRN
  Administered 2019-12-17: 4 mg via INTRAVENOUS

## 2019-12-17 MED ORDER — PROPOFOL 1000 MG/100ML IV EMUL
INTRAVENOUS | Status: AC
  Filled 2019-12-17: qty 100

## 2019-12-17 MED ORDER — DORZOLAMIDE HCL-TIMOLOL MAL 2-0.5 % OP SOLN
1.0000 [drp] | Freq: Two times a day (BID) | OPHTHALMIC | Status: DC
  Administered 2019-12-17 – 2019-12-18 (×2): 1 [drp] via OPHTHALMIC
  Filled 2019-12-17: qty 10

## 2019-12-17 MED ORDER — FENTANYL CITRATE (PF) 100 MCG/2ML IJ SOLN: 25.0000 ug | INTRAMUSCULAR | Status: DC | PRN

## 2019-12-17 MED ORDER — PANTOPRAZOLE SODIUM 40 MG PO TBEC
40.0000 mg | DELAYED_RELEASE_TABLET | Freq: Every day | ORAL | Status: DC
  Administered 2019-12-17 – 2019-12-18 (×2): 40 mg via ORAL
  Filled 2019-12-17 (×2): qty 1

## 2019-12-17 MED ORDER — SODIUM CHLORIDE 0.9 % IR SOLN
Status: DC | PRN
  Administered 2019-12-17: 1000 mL

## 2019-12-17 MED ORDER — POVIDONE-IODINE 10 % EX SWAB
2.0000 "application " | Freq: Once | CUTANEOUS | Status: AC
  Administered 2019-12-17: 2 via TOPICAL

## 2019-12-17 MED ORDER — SODIUM CHLORIDE (PF) 0.9 % IJ SOLN
INTRAMUSCULAR | Status: DC | PRN
  Administered 2019-12-17: 50 mL via INTRAVENOUS

## 2019-12-17 MED ORDER — KCL IN DEXTROSE-NACL 20-5-0.45 MEQ/L-%-% IV SOLN
INTRAVENOUS | Status: DC
  Filled 2019-12-17 (×2): qty 1000

## 2019-12-17 MED ORDER — ONDANSETRON HCL 4 MG/2ML IJ SOLN: 4.0000 mg | Freq: Four times a day (QID) | INTRAMUSCULAR | Status: DC | PRN

## 2019-12-17 MED ORDER — RIVAROXABAN 10 MG PO TABS: 10.0000 mg | ORAL_TABLET | Freq: Every day | ORAL | 0 refills | Status: DC

## 2019-12-17 MED ORDER — METOPROLOL SUCCINATE ER 50 MG PO TB24
50.0000 mg | ORAL_TABLET | Freq: Every day | ORAL | Status: DC
  Administered 2019-12-18: 11:00:00 50 mg via ORAL
  Filled 2019-12-17: qty 1

## 2019-12-17 MED ORDER — BUPIVACAINE HCL (PF) 0.25 % IJ SOLN
INTRAMUSCULAR | Status: AC
  Filled 2019-12-17: qty 30

## 2019-12-17 MED ORDER — DOCUSATE SODIUM 100 MG PO CAPS
100.0000 mg | ORAL_CAPSULE | Freq: Two times a day (BID) | ORAL | Status: DC
  Administered 2019-12-17 – 2019-12-18 (×2): 100 mg via ORAL
  Filled 2019-12-17 (×2): qty 1

## 2019-12-17 MED ORDER — BUPIVACAINE LIPOSOME 1.3 % IJ SUSP
INTRAMUSCULAR | Status: DC | PRN
  Administered 2019-12-17: 20 mL

## 2019-12-17 MED ORDER — METOCLOPRAMIDE HCL 5 MG PO TABS: 5.0000 mg | ORAL_TABLET | Freq: Three times a day (TID) | ORAL | Status: DC | PRN

## 2019-12-17 MED ORDER — OXYCODONE HCL 5 MG PO TABS
5.0000 mg | ORAL_TABLET | ORAL | Status: DC | PRN
  Administered 2019-12-17 (×2): 10 mg via ORAL
  Administered 2019-12-18: 5 mg via ORAL
  Administered 2019-12-18: 10 mg via ORAL
  Filled 2019-12-17 (×4): qty 2

## 2019-12-17 MED ORDER — ONDANSETRON HCL 4 MG/2ML IJ SOLN
INTRAMUSCULAR | Status: AC
  Filled 2019-12-17: qty 2

## 2019-12-17 MED ORDER — BISACODYL 5 MG PO TBEC: 5.0000 mg | DELAYED_RELEASE_TABLET | Freq: Every day | ORAL | Status: DC | PRN

## 2019-12-17 MED ORDER — DEXAMETHASONE SODIUM PHOSPHATE 10 MG/ML IJ SOLN
INTRAMUSCULAR | Status: AC
  Filled 2019-12-17: qty 1

## 2019-12-17 MED ORDER — TRANEXAMIC ACID-NACL 1000-0.7 MG/100ML-% IV SOLN
1000.0000 mg | Freq: Once | INTRAVENOUS | Status: AC
  Administered 2019-12-17: 1000 mg via INTRAVENOUS
  Filled 2019-12-17: qty 100

## 2019-12-17 MED ORDER — IRBESARTAN 150 MG PO TABS
300.0000 mg | ORAL_TABLET | Freq: Every day | ORAL | Status: DC
  Administered 2019-12-17 – 2019-12-18 (×2): 300 mg via ORAL
  Filled 2019-12-17 (×2): qty 2

## 2019-12-17 MED ORDER — PHENYLEPHRINE 40 MCG/ML (10ML) SYRINGE FOR IV PUSH (FOR BLOOD PRESSURE SUPPORT)
PREFILLED_SYRINGE | INTRAVENOUS | Status: DC | PRN
  Administered 2019-12-17 (×2): 80 ug via INTRAVENOUS
  Administered 2019-12-17: 120 ug via INTRAVENOUS

## 2019-12-17 MED ORDER — METHOCARBAMOL 500 MG PO TABS
500.0000 mg | ORAL_TABLET | Freq: Four times a day (QID) | ORAL | Status: DC | PRN
  Administered 2019-12-17: 21:00:00 500 mg via ORAL
  Filled 2019-12-17: qty 1

## 2019-12-17 MED ORDER — ACETAMINOPHEN 325 MG PO TABS
325.0000 mg | ORAL_TABLET | Freq: Four times a day (QID) | ORAL | Status: DC | PRN
  Administered 2019-12-18: 650 mg via ORAL
  Filled 2019-12-17 (×2): qty 2

## 2019-12-17 MED ORDER — LIDOCAINE 2% (20 MG/ML) 5 ML SYRINGE
INTRAMUSCULAR | Status: DC | PRN
  Administered 2019-12-17: 40 mg via INTRAVENOUS

## 2019-12-17 MED ORDER — MENTHOL 3 MG MT LOZG: 1.0000 | LOZENGE | OROMUCOSAL | Status: DC | PRN

## 2019-12-17 MED ORDER — PROPOFOL 500 MG/50ML IV EMUL
INTRAVENOUS | Status: DC | PRN
  Administered 2019-12-17: 30 ug/kg/min via INTRAVENOUS

## 2019-12-17 MED ORDER — PROBIOTIC DAILY PO CAPS: ORAL_CAPSULE | Freq: Every evening | ORAL | Status: DC

## 2019-12-17 MED ORDER — OXYCODONE-ACETAMINOPHEN 5-325 MG PO TABS: 1.0000 | ORAL_TABLET | ORAL | 0 refills | Status: DC | PRN

## 2019-12-17 MED ORDER — OXYCODONE HCL 5 MG PO TABS: 5.0000 mg | ORAL_TABLET | Freq: Once | ORAL | Status: DC | PRN

## 2019-12-17 MED ORDER — PHENYLEPHRINE HCL (PRESSORS) 10 MG/ML IV SOLN
INTRAVENOUS | Status: AC
  Filled 2019-12-17: qty 1

## 2019-12-17 MED ORDER — GABAPENTIN 100 MG PO CAPS
100.0000 mg | ORAL_CAPSULE | Freq: Three times a day (TID) | ORAL | Status: DC
  Administered 2019-12-17 – 2019-12-18 (×3): 100 mg via ORAL
  Filled 2019-12-17 (×3): qty 1

## 2019-12-17 MED ORDER — SODIUM CHLORIDE (PF) 0.9 % IJ SOLN
INTRAMUSCULAR | Status: AC
  Filled 2019-12-17: qty 50

## 2019-12-17 MED ORDER — BUPIVACAINE IN DEXTROSE 0.75-8.25 % IT SOLN
INTRATHECAL | Status: DC | PRN
  Administered 2019-12-17: 1.8 mL via INTRATHECAL

## 2019-12-17 MED ORDER — LIDOCAINE 2% (20 MG/ML) 5 ML SYRINGE
INTRAMUSCULAR | Status: AC
  Filled 2019-12-17: qty 5

## 2019-12-17 MED ORDER — WATER FOR IRRIGATION, STERILE IR SOLN
Status: DC | PRN
  Administered 2019-12-17: 2000 mL

## 2019-12-17 MED ORDER — PROMETHAZINE HCL 25 MG/ML IJ SOLN: 6.2500 mg | INTRAMUSCULAR | Status: DC | PRN

## 2019-12-17 MED ORDER — BUPIVACAINE HCL 0.25 % IJ SOLN
INTRAMUSCULAR | Status: DC | PRN
  Administered 2019-12-17: 30 mL

## 2019-12-17 SURGICAL SUPPLY — 50 items
ATTUNE MED DOME PAT 41 KNEE (Knees) ×1 IMPLANT
ATTUNE PS FEM RT SZ 8 CEM KNEE (Femur) ×1 IMPLANT
ATTUNE PSRP INSR SZ8 5 KNEE (Insert) ×1 IMPLANT
BAG DECANTER FOR FLEXI CONT (MISCELLANEOUS) ×2 IMPLANT
BAG ZIPLOCK 12X15 (MISCELLANEOUS) ×2 IMPLANT
BASE TIBIAL ROT PLAT SZ 8 KNEE (Knees) IMPLANT
BLADE SAG 18X100X1.27 (BLADE) ×2 IMPLANT
BLADE SAW SGTL 11.0X1.19X90.0M (BLADE) ×2 IMPLANT
BLADE SURG SZ10 CARB STEEL (BLADE) ×4 IMPLANT
BNDG ELASTIC 6X10 VLCR STRL LF (GAUZE/BANDAGES/DRESSINGS) ×2 IMPLANT
BOWL SMART MIX CTS (DISPOSABLE) ×2 IMPLANT
CEMENT HV SMART SET (Cement) ×4 IMPLANT
COVER SURGICAL LIGHT HANDLE (MISCELLANEOUS) ×2 IMPLANT
COVER WAND RF STERILE (DRAPES) IMPLANT
CUFF TOURN SGL QUICK 34 (TOURNIQUET CUFF) ×1
CUFF TRNQT CYL 34X4.125X (TOURNIQUET CUFF) ×1 IMPLANT
DECANTER SPIKE VIAL GLASS SM (MISCELLANEOUS) ×6 IMPLANT
DRAPE U-SHAPE 47X51 STRL (DRAPES) ×2 IMPLANT
DRSG AQUACEL AG ADV 3.5X10 (GAUZE/BANDAGES/DRESSINGS) ×2 IMPLANT
DURAPREP 26ML APPLICATOR (WOUND CARE) ×2 IMPLANT
ELECT REM PT RETURN 15FT ADLT (MISCELLANEOUS) ×2 IMPLANT
GLOVE BIO SURGEON STRL SZ7.5 (GLOVE) ×2 IMPLANT
GLOVE BIO SURGEON STRL SZ8.5 (GLOVE) ×2 IMPLANT
GLOVE BIOGEL PI IND STRL 8 (GLOVE) ×1 IMPLANT
GLOVE BIOGEL PI IND STRL 9 (GLOVE) ×1 IMPLANT
GLOVE BIOGEL PI INDICATOR 8 (GLOVE) ×1
GLOVE BIOGEL PI INDICATOR 9 (GLOVE) ×1
GOWN STRL REUS W/TWL XL LVL3 (GOWN DISPOSABLE) ×4 IMPLANT
HANDPIECE INTERPULSE COAX TIP (DISPOSABLE) ×1
HOOD PEEL AWAY FLYTE STAYCOOL (MISCELLANEOUS) ×6 IMPLANT
KIT TURNOVER KIT A (KITS) IMPLANT
NDL HYPO 21X1.5 SAFETY (NEEDLE) ×2 IMPLANT
NEEDLE HYPO 21X1.5 SAFETY (NEEDLE) ×4 IMPLANT
NS IRRIG 1000ML POUR BTL (IV SOLUTION) ×2 IMPLANT
PACK ICE MAXI GEL EZY WRAP (MISCELLANEOUS) ×2 IMPLANT
PACK TOTAL KNEE CUSTOM (KITS) ×2 IMPLANT
PENCIL SMOKE EVACUATOR (MISCELLANEOUS) IMPLANT
PIN DRILL FIX HALF THREAD (BIT) ×1 IMPLANT
PIN STEINMAN FIXATION KNEE (PIN) ×1 IMPLANT
PROTECTOR NERVE ULNAR (MISCELLANEOUS) ×2 IMPLANT
SET HNDPC FAN SPRY TIP SCT (DISPOSABLE) ×1 IMPLANT
SUT VIC AB 1 CTX 36 (SUTURE) ×1
SUT VIC AB 1 CTX36XBRD ANBCTR (SUTURE) ×1 IMPLANT
SUT VIC AB 3-0 CT1 27 (SUTURE) ×3
SUT VIC AB 3-0 CT1 TAPERPNT 27 (SUTURE) ×3 IMPLANT
SYR CONTROL 10ML LL (SYRINGE) ×4 IMPLANT
TIBIAL BASE ROT PLAT SZ 8 KNEE (Knees) ×2 IMPLANT
TRAY FOLEY MTR SLVR 16FR STAT (SET/KITS/TRAYS/PACK) ×2 IMPLANT
WATER STERILE IRR 1000ML POUR (IV SOLUTION) ×4 IMPLANT
YANKAUER SUCT BULB TIP 10FT TU (MISCELLANEOUS) ×2 IMPLANT

## 2019-12-17 NOTE — Anesthesia Procedure Notes (Signed)
Anesthesia Regional Block: Adductor canal block   Pre-Anesthetic Checklist: ,, timeout performed, Correct Patient, Correct Site, Correct Laterality, Correct Procedure, Correct Position, site marked, Risks and benefits discussed, pre-op evaluation,  At surgeon's request and post-op pain management  Laterality: Right  Prep: Maximum Sterile Barrier Precautions used, chloraprep       Needles:  Injection technique: Single-shot  Needle Type: Echogenic Stimulator Needle     Needle Length: 9cm  Needle Gauge: 22     Additional Needles:   Procedures:,,,, ultrasound used (permanent image in chart),,,,  Narrative:  Start time: 12/17/2019 12:24 PM End time: 12/17/2019 12:27 PM Injection made incrementally with aspirations every 5 mL.  Performed by: Personally  Anesthesiologist: Brennan Bailey, MD  Additional Notes: Risks, benefits, and alternative discussed. Patient gave consent for procedure. Patient prepped and draped in sterile fashion. Sedation administered, patient remains easily responsive to voice. Relevant anatomy identified with ultrasound guidance. Local anesthetic given in 5cc increments with no signs or symptoms of intravascular injection. No pain or paraesthesias with injection. Patient monitored throughout procedure with signs of LAST or immediate complications. Tolerated well. Ultrasound image placed in chart.  Tawny Asal, MD

## 2019-12-17 NOTE — Op Note (Signed)
PATIENT ID:      Glen Bolton  MRN:     [id number] DOB/AGE:    1941-06-19 / 79 y.o.       OPERATIVE REPORT   DATE OF PROCEDURE:  12/17/2019      PREOPERATIVE DIAGNOSIS:   RIGHT KNEE OSETOARTHRITIS      Estimated body mass index is 38.51 kg/m as calculated from the following:   Height as of 12/12/19: 5\' 10"  (1.778 m).   Weight as of 12/12/19: 121.7 kg.                                                       POSTOPERATIVE DIAGNOSIS: Same                                                                PROCEDURE:  Procedure(s): RIGHT TOTAL KNEE ARTHROPLASTY Using DepuyAttune RP implants #8R Femur, #8Tibia, 5 mm Attune RP bearing, 41 Patella    SURGEON: Kerin Salen  ASSISTANT:   Kerry Hough. [employer]   (Present and scrubbed throughout the case, critical for assistance with exposure, retraction, instrumentation, and closure.)        ANESTHESIA: Spinal, 20cc Exparel, 50cc 0.25% Marcaine EBL: 400 cc FLUID REPLACEMENT: 1600 cc crystaloid TOURNIQUET: DRAINS: None TRANEXAMIC ACID: 1gm IV, 2gm topical COMPLICATIONS:  None         INDICATIONS FOR PROCEDURE: The patient has  RIGHT KNEE OSETOARTHRITIS, Varus deformities, XR shows bone on bone arthritis, lateral subluxation of tibia. Patient has failed all conservative measures including anti-inflammatory medicines, narcotics, attempts at exercise and weight loss, cortisone injections and viscosupplementation.  Risks and benefits of surgery have been discussed, questions answered.   DESCRIPTION OF PROCEDURE: The patient identified by armband, received  IV antibiotics, in the holding area at [facility]. Patient taken to the operating room, appropriate anesthetic monitors were attached, and Spinal anesthesia was  induced. IV Tranexamic acid was given.Tourniquet applied high to the operative thigh. Lateral post and foot positioner applied to the table, the lower extremity was then prepped and draped in usual sterile fashion from the toes to the  tourniquet. Time-out procedure was performed. Kerry Hough. [facility] PAC, was present and scrubbed throughout the case, critical for assistance with, positioning, exposure, retraction, instrumentation, and closure.The skin and subcutaneous tissue along the incision was injected with 20 cc of a mixture of Exparel and Marcaine solution, using a 20-gauge by 1-1/2 inch needle. We began the operation, with the knee flexed 130 degrees, by making the anterior midline incision starting at handbreadth above the patella going over the patella 1 cm medial to and 4 cm distal to the tibial tubercle. Small bleeders in the skin and the subcutaneous tissue identified and cauterized. Transverse retinaculum was incised and reflected medially and a medial parapatellar arthrotomy was accomplished. the patella was everted and theprepatellar fat pad resected. The superficial medial collateral ligament was then elevated from anterior to posterior along the proximal flare of the tibia and anterior half of the menisci resected. The knee was hyperflexed exposing bone on bone arthritis. Peripheral and notch osteophytes as well as the  cruciate ligaments were then resected. We continued to work our way around posteriorly along the proximal tibia, and externally rotated the tibia subluxing it out from underneath the femur. A McHale PCL retractor was placed through the notch and a lateral Hohmann retractor placed, and we then entered the proximal tibia in line with the Depuy starter drill in line with the axis of the tibia followed by an intramedullary guide rod and 0-degree posterior slope cutting guide. The tibial cutting guide, 4 degree posterior sloped, was pinned into place allowing resection of 6 mm of bone medially and 11 mm of bone laterally. Satisfied with the tibial resection, we then entered the distal femur 2 mm anterior to the PCL origin with the intramedullary guide rod and applied the distal femoral cutting guide set at 9 mm, with 5  degrees of valgus. This was pinned along the epicondylar axis. At this point, the distal femoral cut was accomplished without difficulty. We then sized for a #8R femoral component and pinned the guide in 3 degrees of external rotation. The chamfer cutting guide was pinned into place. The anterior, posterior, and chamfer cuts were accomplished without difficulty followed by the Attune RP box cutting guide and the box cut. We also removed posterior osteophytes from the posterior femoral condyles. The posterior capsule was injected with Exparel solution. The knee was brought into full extension. We checked our extension gap and fit a 5 mm bearing. Distracting in extension with a lamina spreader,  bleeders in the posterior capsule, Posterior medial and posterior lateral gutter were cauterized.  The transexamic acid-soaked sponge was then placed in the gap of the knee in extension. The knee was flexed 30. The posterior patella cut was accomplished with the 9.5 mm Attune cutting guide, sized for a 2mm dome, and the fixation pegs drilled.The knee was then once again hyperflexed exposing the proximal tibia. We sized for a # 8 tibial base plate, applied the smokestack and the conical reamer followed by the the Delta fin keel punch. We then hammered into place the Attune RP trial femoral component, drilled the lugs, inserted a  5 mm trial bearing, trial patellar button, and took the knee through range of motion from 0-130 degrees. Medial and lateral ligamentous stability was checked. No thumb pressure was required for patellar Tracking. The tourniquet was not used. All trial components were removed, mating surfaces irrigated with pulse lavage, and dried with suction and sponges. 10 cc of the Exparel solution was applied to the cancellus bone of the patella distal femur and proximal tibia.  After waiting 30 seconds, the bony surfaces were again, dried with sponges. A double batch of DePuy HV cement was mixed and applied to  all bony metallic mating surfaces except for the posterior condyles of the femur itself. In order, we hammered into place the tibial tray and removed excess cement, the femoral component and removed excess cement. The final Attune RP bearing was inserted, and the knee brought to full extension with compression. The patellar button was clamped into place, and excess cement removed. The knee was held at 30 flexion with compression, while the cement cured. The wound was irrigated out with normal saline solution pulse lavage. The rest of the Exparel was injected into the parapatellar arthrotomy, subcutaneous tissues, and periosteal tissues. The parapatellar arthrotomy was closed with running #1 Vicryl suture. The subcutaneous tissue with 0 and 2-0 undyed Vicryl suture, and the skin with running 3-0 SQ vicryl. An Aquacil and Ace wrap were applied. The  patient was taken to recovery room without difficulty.   Kerin Salen 12/17/2019, 6:07 AM

## 2019-12-17 NOTE — Transfer of Care (Signed)
Immediate Anesthesia Transfer of Care Note  Patient: Glen Bolton  Procedure(s) Performed: RIGHT TOTAL KNEE ARTHROPLASTY (Right Knee)  Patient Location: PACU  Anesthesia Type:Spinal  Level of Consciousness: awake, alert  and oriented  Airway & Oxygen Therapy: Patient Spontanous Breathing and Patient connected to face mask oxygen  Post-op Assessment: Report given to RN and Post -op Vital signs reviewed and stable  Post vital signs: Reviewed and stable  Last Vitals:  Vitals Value Taken Time  BP    Temp    Pulse    Resp    SpO2      Last Pain:  Vitals:   12/17/19 1007  TempSrc:   PainSc: 0-No pain         Complications: No apparent anesthesia complications

## 2019-12-17 NOTE — Anesthesia Procedure Notes (Signed)
Spinal  Patient location during procedure: OR Start time: 12/17/2019 12:57 PM End time: 12/17/2019 1:02 PM Staffing Performed: resident/CRNA  Anesthesiologist: Brennan Bailey, MD Resident/CRNA: Talbot Grumbling, CRNA Preanesthetic Checklist Completed: patient identified, IV checked, site marked, risks and benefits discussed, surgical consent, monitors and equipment checked, pre-op evaluation and timeout performed Spinal Block Patient position: sitting Prep: DuraPrep Patient monitoring: heart rate, cardiac monitor, continuous pulse ox and blood pressure Approach: midline Location: L3-4 Injection technique: single-shot Needle Needle type: Pencan  Needle gauge: 24 G Needle length: 9 cm Assessment Sensory level: T4 Additional Notes Clear CSF, no paresthesia, patient tolerated well.

## 2019-12-17 NOTE — Progress Notes (Signed)
Orthopedic Tech Progress Note Patient Details:  Glen Bolton October 25, 1940 [id number]  Ortho Devices Ortho Device/Splint Location: foot roll Ortho Device/Splint Interventions: Application   Post Interventions Patient Tolerated: Well Instructions Provided: Care of device   Glen Bolton 12/17/2019, 4:13 PM

## 2019-12-17 NOTE — Discharge Instructions (Addendum)

## 2019-12-17 NOTE — Evaluation (Signed)
Physical Therapy Evaluation Patient Details Name: Glen Bolton MRN: [id number] DOB: 08-21-1941 Today's Date: 12/17/2019   History of Present Illness  s/p R TKA; PMH: afib, L THA, R-RCR  Clinical Impression  Pt is s/p TKA resulting in the deficits listed below (see PT Problem List).  Pt motivated to mobilize however residual effects of anesthesia limited mobility. Sat EOB and performed  TKA exercises in seated and supine. Anticipate steady progress  Pt will benefit from skilled PT to increase their independence and safety with mobility to allow discharge to the venue listed below.      Follow Up Recommendations Follow surgeon's recommendation for DC plan and follow-up therapies    Equipment Recommendations  Rolling walker with 5" wheels;3in1 (PT)    Recommendations for Other Services       Precautions / Restrictions Precautions Precautions: Knee Restrictions Weight Bearing Restrictions: No RLE Weight Bearing: Weight bearing as tolerated      Mobility  Bed Mobility Overal bed mobility: Needs Assistance Bed Mobility: Supine to Sit;Sit to Supine     Supine to sit: Min guard Sit to supine: Min assist   General bed mobility comments: light assist to bring R  LE on to bed, incr time  Transfers                 General transfer comment: deferred d/t residual effects of anesthesia  Ambulation/Gait                Stairs            Wheelchair Mobility    Modified Rankin (Stroke Patients Only)       Balance                                             Pertinent Vitals/Pain Pain Assessment: 0-10 Pain Score: 3  Pain Location: right knee Pain Descriptors / Indicators: Discomfort;Sore Pain Intervention(s): Limited activity within patient's tolerance;Monitored during session    [name] expects to be discharged to:: Private residence Living Arrangements: Spouse/significant other;Children Available Help at  Discharge: Available 24 hours/day Type of Home: House Home Access: Stairs to enter   Technical brewer of Steps: 1 Home Layout: One level Home Equipment: [place] - single point      Prior Function Level of Independence: Independent               Hand Dominance        Extremity/Trunk Assessment   Upper Extremity Assessment Upper Extremity Assessment: Overall WFL for tasks assessed    Lower Extremity Assessment Lower Extremity Assessment: LLE deficits/detail LLE Deficits / Details: ankle pt 0/5 (likely d/t effects of anesthesia),  knee 2+ to 3/5, hip flexion 3/5, extension 2/5       Communication      Cognition Arousal/Alertness: Awake/alert Behavior During Therapy: WFL for tasks assessed/performed Overall Cognitive Status: Within Functional Limits for tasks assessed                                        General Comments      Exercises Total Joint Exercises Ankle Circles/Pumps: AROM;Right;10 reps;Limitations Ankle Circles/Pumps Limitations: right foot unable to pf Quad Sets: AROM;Both;10 reps Heel Slides: AAROM;Right;5 reps Long Arc Quad: AROM;5 reps;Right;Seated   Assessment/Plan    PT  Assessment Patient needs continued PT services  PT Problem List Decreased strength;Decreased mobility;Decreased range of motion;Decreased activity tolerance;Decreased knowledge of use of DME;Pain       PT Treatment Interventions DME instruction;Gait training;Functional mobility training;Therapeutic activities;Patient/family education;Therapeutic exercise;Stair training    PT Goals (Current goals can be found in the Care Plan section)  Acute Rehab PT Goals Patient Stated Goal: home tomorrow PT Goal Formulation: With patient Time For Goal Achievement: 12/24/19 Potential to Achieve Goals: Good    Frequency 7X/week   Barriers to discharge        Co-evaluation               AM-PAC PT "6 Clicks" Mobility  Outcome Measure Help needed  turning from your back to your side while in a flat bed without using bedrails?: A Little Help needed moving from lying on your back to sitting on the side of a flat bed without using bedrails?: A Little Help needed moving to and from a bed to a chair (including a wheelchair)?: A Lot Help needed standing up from a chair using your arms (e.g., wheelchair or bedside chair)?: Total Help needed to walk in hospital room?: Total Help needed climbing 3-5 steps with a railing? : Total 6 Click Score: 11    End of Session   Activity Tolerance: Patient tolerated treatment well;Other (comment)(limited d/t right foot and glut numbness) Patient left: in bed;with call bell/phone within reach;with bed alarm set Nurse Communication: Mobility status PT Visit Diagnosis: Difficulty in walking, not elsewhere classified (R26.2)    Time: [id number] PT Time Calculation (min) (ACUTE ONLY): 17 min   Charges:   PT Evaluation $PT Eval Low Complexity: [place], PT   Acute Rehab Dept [facility]): [id number]   12/17/2019   [facility] 12/17/2019, 7:25 PM

## 2019-12-17 NOTE — Progress Notes (Signed)
AssistedDr. Howse with right, ultrasound guided, adductor canal block. Side rails up, monitors on throughout procedure. See vital signs in flow sheet. Tolerated Procedure well.  

## 2019-12-17 NOTE — Interval H&P Note (Signed)
History and Physical Interval Note:  12/17/2019 9:43 AM  [facility] Nurse  has presented today for surgery, with the diagnosis of [place].  The various methods of treatment have been discussed with the patient and family. After consideration of risks, benefits and other options for treatment, the patient has consented to  Procedure(s): RIGHT TOTAL KNEE ARTHROPLASTY (Right) as a surgical intervention.  The patient's history has been reviewed, patient examined, no change in status, stable for surgery.  I have reviewed the patient's chart and labs.  Questions were answered to the patient's satisfaction.     Kerin Salen

## 2019-12-17 NOTE — Anesthesia Procedure Notes (Signed)
Date/Time: 12/17/2019 12:56 PM Performed by: Talbot Grumbling, CRNA Oxygen Delivery Method: Simple face mask

## 2019-12-18 DIAGNOSIS — M1711 Unilateral primary osteoarthritis, right knee: Secondary | ICD-10-CM | POA: Diagnosis not present

## 2019-12-18 LAB — BASIC METABOLIC PANEL
Anion gap: 10 (ref 5–15)
BUN: 18 mg/dL (ref 8–23)
CO2: 24 mmol/L (ref 22–32)
Calcium: 8.6 mg/dL — ABNORMAL LOW (ref 8.9–10.3)
Chloride: 102 mmol/L (ref 98–111)
Creatinine, Ser: 0.86 mg/dL (ref 0.61–1.24)
GFR calc Af Amer: >60 mL/min (ref >60)
GFR calc non Af Amer: >60 mL/min (ref >60)
Glucose, Bld: 265 mg/dL — ABNORMAL HIGH (ref 70–99)
Potassium: 4.3 mmol/L (ref 3.5–5.1)
Sodium: 136 mmol/L (ref 135–145)

## 2019-12-18 LAB — CBC
HCT: 43.6 % (ref 39.0–52.0)
Hemoglobin: 14.3 g/dL (ref 13.0–17.0)
MCH: 31.4 pg (ref 26.0–34.0)
MCHC: 32.8 g/dL (ref 30.0–36.0)
MCV: 95.6 fL (ref 80.0–100.0)
Platelets: 154 10*3/uL (ref 150–400)
RBC: 4.56 MIL/uL (ref 4.22–5.81)
RDW: 13 % (ref 11.5–15.5)
WBC: 9.5 10*3/uL (ref 4.0–10.5)
nRBC: 0 % (ref 0.0–0.2)

## 2019-12-18 NOTE — Discharge Summary (Signed)
Patient ID: Blakely Romack MRN: [id number] DOB/AGE: Jan 09, 1941 79 y.o.  Admit date: 12/17/2019 Discharge date: 12/18/2019  Admission Diagnoses:  Principal Problem:   Osteoarthritis of right knee Active Problems:   Total knee replacement status, right   Discharge Diagnoses:  Same  Past Medical History:  Diagnosis Date  . Anxiety   . Arthritis   . Atrial fibrillation ([place])   . Borderline diabetic   . Dysrhythmia   . GERD (gastroesophageal reflux disease)   . Glaucoma   . Hard of hearing   . History of gastritis   . History of kidney stones   . History of stomach ulcers   . Hx of migraines    no migraines since he was in his 44's  . Hypertension   . Peripheral neuropathy 12/26/2017  . Pneumonia   . Sleep apnea    CPAP  not currently working  couldnt breathe with it laying down trying something different end of  April or May 2021    Surgeries: Procedure(s): RIGHT TOTAL KNEE ARTHROPLASTY on 12/17/2019   Consultants:   Discharged Condition: Improved  Hospital Course: Amear Franta is an 79 y.o. male who was admitted 12/17/2019 for operative treatment ofOsteoarthritis of right knee. Patient has severe unremitting pain that affects sleep, daily activities, and work/hobbies. After pre-op clearance the patient was taken to the operating room on 12/17/2019 and underwent  Procedure(s): RIGHT TOTAL KNEE ARTHROPLASTY.    Patient was given perioperative antibiotics:  Anti-infectives (From admission, onward)   Start     Dose/Rate Route Frequency Ordered Stop   12/17/19 0600  ceFAZolin (ANCEF) 3 g in dextrose 5 % 50 mL IVPB     3 g 100 mL/hr over 30 Minutes Intravenous On call to O.R. 12/16/19 1135 12/17/19 1310       Patient was given sequential compression devices, early ambulation, and chemoprophylaxis to prevent DVT.  Patient benefited maximally from hospital stay and there were no complications.    Recent vital signs:  Patient Vitals for the past 24 hrs:  BP Temp Temp src  Pulse Resp SpO2 Height Weight  12/18/19 0611 118/74 97.7 F (36.5 C) Oral (Abnormal) 59 16 97 % no documentation no documentation  12/18/19 0143 114/69 97.8 F (36.6 C) Oral 60 16 95 % no documentation no documentation  12/17/19 2220 140/80 98.2 F (36.8 C) Oral 61 16 96 % no documentation no documentation  12/17/19 1941 (Abnormal) 149/70 97.6 F (36.4 C) Oral 60 16 96 % no documentation no documentation  12/17/19 1910 no documentation no documentation no documentation no documentation no documentation no documentation 5\' 10"  (1.778 m) 124.9 kg  12/17/19 1841 (Abnormal) 147/89 98 F (36.7 C) Oral 72 16 100 % no documentation no documentation  12/17/19 1740 (Abnormal) 154/96 (Abnormal) 97.5 F (36.4 C) Oral 67 16 97 % no documentation no documentation  12/17/19 1646 (Abnormal) 154/99 97.8 F (36.6 C) Oral 73 16 98 % no documentation no documentation  12/17/19 1630 no documentation 98.2 F (36.8 C) no documentation no documentation no documentation no documentation no documentation no documentation  12/17/19 1600 (Abnormal) 158/109 no documentation no documentation (Abnormal) 57 16 99 % no documentation no documentation  12/17/19 1545 (Abnormal) 155/85 no documentation no documentation (Abnormal) 59 14 100 % no documentation no documentation  12/17/19 1530 (Abnormal) 146/93 no documentation no documentation (Abnormal) 56 15 100 % no documentation no documentation  12/17/19 1515 (Abnormal) 141/89 no documentation no documentation 67 18 100 % no documentation no documentation  12/17/19 1506 (  Abnormal) 125/59 97.8 F (36.6 C) no documentation 80 16 100 % no documentation no documentation  12/17/19 1243 no documentation no documentation no documentation 89 18 98 % no documentation no documentation  12/17/19 1242 no documentation no documentation no documentation 78 19 100 % no documentation no documentation  12/17/19 1241 no documentation no documentation no documentation 82 19 99 % no  documentation no documentation  12/17/19 1240 no documentation no documentation no documentation 67 18 99 % no documentation no documentation  12/17/19 1239 no documentation no documentation no documentation 76 20 100 % no documentation no documentation  12/17/19 1238 no documentation no documentation no documentation 77 (Abnormal) 22 99 % no documentation no documentation  12/17/19 1237 no documentation no documentation no documentation 78 13 99 % no documentation no documentation  12/17/19 1236 no documentation no documentation no documentation 80 17 99 % no documentation no documentation  12/17/19 1235 no documentation no documentation no documentation 85 20 99 % no documentation no documentation  12/17/19 1234 no documentation no documentation no documentation 82 18 99 % no documentation no documentation  12/17/19 1233 (Abnormal) 157/94 no documentation no documentation (Abnormal) 57 14 99 % no documentation no documentation  12/17/19 1232 (Abnormal) 157/94 no documentation no documentation 80 18 98 % no documentation no documentation  12/17/19 1231 no documentation no documentation no documentation 69 19 99 % no documentation no documentation  12/17/19 1230 no documentation no documentation no documentation 72 17 99 % no documentation no documentation  12/17/19 1229 no documentation no documentation no documentation 73 18 100 % no documentation no documentation  12/17/19 1228 no documentation no documentation no documentation 84 16 99 % no documentation no documentation  12/17/19 1227 no documentation no documentation no documentation 68 14 99 % no documentation no documentation  12/17/19 1226 no documentation no documentation no documentation 89 20 100 % no documentation no documentation  12/17/19 1225 no documentation no documentation no documentation 86 18 99 % no documentation no documentation  12/17/19 1224 no documentation no documentation no documentation 85 (Abnormal) 21 100 % no  documentation no documentation  12/17/19 1223 no documentation no documentation no documentation 84 20 99 % no documentation no documentation  12/17/19 1222 126/88 no documentation no documentation 80 (Abnormal) 21 97 % no documentation no documentation  12/17/19 1000 (Abnormal) 167/103 98.3 F (36.8 C) Oral 90 18 98 % no documentation no documentation     Recent laboratory studies:  Recent Labs    12/18/19 0329  WBC 9.5  HGB 14.3  HCT 43.6  PLT 154  NA 136  K 4.3  CL 102  CO2 24  BUN 18  CREATININE 0.86  GLUCOSE 265*  CALCIUM 8.6*     Discharge Medications:   Allergies as of 12/18/2019   No Known Allergies     Medication List    Take these medications   dorzolamide-timolol 22.3-6.8 MG/ML ophthalmic solution Commonly known as: COSOPT Place 1 drop into both eyes 2 (two) times daily.   hydrochlorothiazide 12.5 MG tablet Commonly known as: HYDRODIURIL Take 12.5 mg by mouth every evening.   metoprolol succinate 50 MG 24 hr tablet Commonly known as: TOPROL-XL TAKE 1 TABLET BY MOUTH DAILY WITH OR IMMEDIATELY AFTER A MEAL What changed: See the new instructions.   multivitamin with minerals Tabs tablet Take 1 tablet by mouth daily.   olmesartan 40 MG tablet Commonly known as: BENICAR Take 40 mg by mouth daily.   oxyCODONE-acetaminophen 5-325 MG tablet Commonly known as:  PERCOCET/ROXICET Take 1 tablet by mouth every 4 (four) hours as needed for severe pain.   PROBIOTIC DAILY PO Take 1 capsule by mouth every evening.   rivaroxaban 10 MG Tabs tablet Commonly known as: XARELTO Take 1 tablet (10 mg total) by mouth daily. What changed:   medication strength  See the new instructions.   tiZANidine 2 MG tablet Commonly known as: ZANAFLEX Take 1 tablet (2 mg total) by mouth every 6 (six) hours as needed.        Durable Medical Equipment  (From admission, onward)         Start     Ordered   12/17/19 1647  DME Walker rolling  Once    Question:  Patient  needs a walker to treat with the following condition  Answer:  Status post right knee replacement   12/17/19 1646   12/17/19 1647  DME 3 n 1  Once     12/17/19 1646          Diagnostic Studies: ECHOCARDIOGRAM COMPLETE  Result Date: 12/06/2019    ECHOCARDIOGRAM REPORT   Patient Name:   JERMARIUS KOSA Date of Exam: 12/05/2019 Medical Rec #:  [id number]   Height:       70.0 in Accession #:    [id number]  Weight:       268.0 lb Date of Birth:  1941/04/13  BSA:          2.364 m Patient Age:    75 years    BP:           124/64 mmHg Patient Gender: M           HR:           82 bpm. Exam Location:  Outpatient Procedure: 2D Echo, Cardiac Doppler and Color Doppler Indications:    I48.2 Chronic atrial fibrillation  History:        Patient has prior history of Echocardiogram examinations, most                 recent 09/10/2015. Arrythmias:Atrial Fibrillation; Risk                 Factors:Hypertension.  Sonographer:    Vikki Ports Turrentine Referring Phys: [id number] [place]  1. Left ventricular ejection fraction, by estimation, is 60 to 65%. The left ventricle has normal function. The left ventricle has no regional wall motion abnormalities. Left ventricular diastolic function could not be evaluated.  2. Right ventricular systolic function is mildly reduced. The right ventricular size is normal. There is moderately elevated pulmonary artery systolic pressure.  3. Left atrial size was moderately dilated.  4. Right atrial size was mildly dilated.  5. The mitral valve is normal in structure. Trivial mitral valve regurgitation. No evidence of mitral stenosis.  6. The aortic valve is tricuspid. Aortic valve regurgitation is not visualized. No aortic stenosis is present.  7. The inferior vena cava is normal in size with <50% respiratory variability, suggesting right atrial pressure of 8 mmHg. Comparison(s): No significant change from prior study. FINDINGS  Left Ventricle: Left ventricular ejection fraction, by  estimation, is 60 to 65%. The left ventricle has normal function. The left ventricle has no regional wall motion abnormalities. The left ventricular internal cavity size was normal in size. There is  no left ventricular hypertrophy. Left ventricular diastolic function could not be evaluated due to atrial fibrillation. Left ventricular diastolic function could not be evaluated. Right Ventricle: The right ventricular size is normal.  Right vetricular wall thickness was not assessed. Right ventricular systolic function is mildly reduced. There is moderately elevated pulmonary artery systolic pressure. The tricuspid regurgitant velocity is 2.96 m/s, and with an assumed right atrial pressure of 8 mmHg, the estimated right ventricular systolic pressure is 0000000 mmHg. Left Atrium: Left atrial size was moderately dilated. Right Atrium: Right atrial size was mildly dilated. Pericardium: There is no evidence of pericardial effusion. Mitral Valve: The mitral valve is normal in structure. Trivial mitral valve regurgitation. No evidence of mitral valve stenosis. Tricuspid Valve: The tricuspid valve is normal in structure. Tricuspid valve regurgitation is trivial. No evidence of tricuspid stenosis. Aortic Valve: The aortic valve is tricuspid. . There is mild thickening and mild calcification of the aortic valve. Aortic valve regurgitation is not visualized. No aortic stenosis is present. There is mild thickening of the aortic valve. There is mild calcification of the aortic valve. Pulmonic Valve: The pulmonic valve was not well visualized. Pulmonic valve regurgitation is not visualized. No evidence of pulmonic stenosis. Aorta: The aortic root, ascending aorta and aortic arch are all structurally normal, with no evidence of dilitation or obstruction. Venous: The inferior vena cava is normal in size with less than 50% respiratory variability, suggesting right atrial pressure of 8 mmHg. IAS/Shunts: No atrial level shunt detected by  color flow Doppler.  LEFT VENTRICLE PLAX 2D LVIDd:         5.30 cm LVIDs:         3.20 cm LV PW:         1.10 cm LV IVS:        1.10 cm LVOT diam:     1.90 cm LV SV:         57 LV SV Index:   24 LVOT Area:     2.84 cm  RIGHT VENTRICLE RV S prime:     12.70 cm/s TAPSE (M-mode): 1.5 cm LEFT ATRIUM              Index       RIGHT ATRIUM           Index LA diam:        5.70 cm  2.41 cm/m  RA Area:     22.00 cm LA Vol (A2C):   94.8 ml  40.11 ml/m RA Volume:   61.70 ml  26.10 ml/m LA Vol (A4C):   113.0 ml 47.80 ml/m LA Biplane Vol: 103.0 ml 43.57 ml/m  AORTIC VALVE LVOT Vmax:   93.77 cm/s LVOT Vmean:  66.500 cm/s LVOT VTI:    0.200 m  AORTA Ao Root diam: 2.60 cm MITRAL VALVE                TRICUSPID VALVE MV Area (PHT): 5.27 cm     TR Peak grad:   35.0 mmHg MV Decel Time: 144 msec     TR Vmax:        296.00 cm/s MV E velocity: 101.00 cm/s                             SHUNTS                             Systemic VTI:  0.20 m                             Systemic  Diam: 1.90 cm Buford Dresser MD Electronically signed by Buford Dresser MD Signature Date/Time: 12/06/2019/10:43:47 AM    Final     Disposition: Discharge disposition: 01-Home or Self Care       Discharge Instructions    Call MD / Call 911   Complete by: As directed    If you experience chest pain or shortness of breath, CALL 911 and be transported to the hospital emergency room.  If you develope a fever above 101 F, pus (white drainage) or increased drainage or redness at the wound, or calf pain, call your surgeon's office.   Constipation Prevention   Complete by: As directed    Drink plenty of fluids.  Prune juice may be helpful.  You may use a stool softener, such as Colace (over the counter) 100 mg twice a day.  Use MiraLax (over the counter) for constipation as needed.   Diet - low sodium heart healthy   Complete by: As directed    Increase activity slowly as tolerated   Complete by: As directed       Follow-up  Information    Frederik Pear, MD In 2 weeks.   Specialty: Orthopedic Surgery Contact information: [place] [id number] [phone]            Signed: Kerin Salen 12/18/2019, 7:59 AM

## 2019-12-18 NOTE — Progress Notes (Signed)
Physical Therapy Treatment Patient Details Name: Glen Bolton MRN: [id number] DOB: 1940/12/11 Today's Date: 12/18/2019    History of Present Illness s/p R TKA; PMH: afib, L THA, R-RCR    PT Comments    Pt progressing well. Reviewed TKA HEP, gait/safety with mobility. Pain well controlled. Will see for a secodn session for stair practice and pt should be ready for d/c later today.    Follow Up Recommendations  Follow surgeon's recommendation for DC plan and follow-up therapies     Equipment Recommendations  Rolling walker with 5" wheels;3in1 (PT)    Recommendations for Other Services       Precautions / Restrictions Precautions Precautions: Knee Restrictions RLE Weight Bearing: Weight bearing as tolerated    Mobility  Bed Mobility Overal bed mobility: Needs Assistance Bed Mobility: Supine to Sit     Supine to sit: Supervision     General bed mobility comments: for safety  Transfers Overall transfer level: Needs assistance   Transfers: Sit to/from Stand Sit to Stand: Min guard         General transfer comment: cues for safety and hand placement   Ambulation/Gait Ambulation/Gait assistance: Min guard Gait Distance (Feet): 120 Feet Assistive device: Rolling walker (2 wheeled) Gait Pattern/deviations: Step-to pattern;Step-through pattern;Decreased stance time - right     General Gait Details: cues for sequence and progression   Stairs             Wheelchair Mobility    Modified Rankin (Stroke Patients Only)       Balance                                            Cognition Arousal/Alertness: Awake/alert Behavior During Therapy: WFL for tasks assessed/performed Overall Cognitive Status: Within Functional Limits for tasks assessed                                        Exercises Total Joint Exercises Ankle Circles/Pumps: AROM;Both;10 reps Quad Sets: AROM;Both;10 reps Short Arc Quad: AROM;Right;10  reps Heel Slides: AAROM;AROM;Right;10 reps Hip ABduction/ADduction: AROM;Right;10 reps Straight Leg Raises: AROM;Right;10 reps Goniometric ROM: ~ -6 to 75 degrees right knee flexion     General Comments        Pertinent Vitals/Pain Pain Assessment: 0-10 Pain Score: 2  Pain Location: right knee Pain Descriptors / Indicators: Discomfort;Sore Pain Intervention(s): Limited activity within patient's tolerance;Monitored during session;Premedicated before session;Repositioned    Home Living                      Prior Function            PT Goals (current goals can now be found in the care plan section) Acute Rehab PT Goals Patient Stated Goal: home today PT Goal Formulation: With patient Time For Goal Achievement: 12/24/19 Potential to Achieve Goals: Good Progress towards PT goals: Progressing toward goals    Frequency    7X/week      PT Plan Current plan remains appropriate    Co-evaluation              AM-PAC PT "6 Clicks" Mobility   Outcome Measure  Help needed turning from your back to your side while in a flat bed without using bedrails?: A Little Help needed moving  from lying on your back to sitting on the side of a flat bed without using bedrails?: A Little Help needed moving to and from a bed to a chair (including a wheelchair)?: A Little Help needed standing up from a chair using your arms (e.g., wheelchair or bedside chair)?: A Little Help needed to walk in hospital room?: A Little Help needed climbing 3-5 steps with a railing? : A Little 6 Click Score: 18    End of Session Equipment Utilized During Treatment: Gait belt Activity Tolerance: Patient tolerated treatment well Patient left: in chair;with call bell/phone within reach;with chair alarm set;with family/visitor present Nurse Communication: Mobility status PT Visit Diagnosis: Difficulty in walking, not elsewhere classified (R26.2)     Time: [id number] PT Time Calculation (min)  (ACUTE ONLY): 20 min  Charges:  $Therapeutic Exercise: 8-22 mins                     Baxter Flattery, PT   Acute Rehab Dept [facility]): [id number]   12/18/2019    [facility] 12/18/2019, 1:33 PM

## 2019-12-18 NOTE — TOC Progression Note (Signed)
Transition of Care [facility]) - Progression Note    Patient Details  Name: Glen Bolton MRN: [id number] Date of Birth: 06/20/1941  Transition of Care [facility]) CM/SW [place], LCSW Phone Number: 12/18/2019, 10:36 AM  Clinical Narrative:    Therapy Plan: Prearranged through [facility] DME-RW and 3 in 1 provided through [place]   Expected Discharge Plan: [place] Barriers to Discharge: Barriers Resolved  Expected Discharge Plan and Services Expected Discharge Plan: [place]         Expected Discharge Date: 12/18/19                 DME Agency: [facility] (formerly [facility] [place] Date DME Agency Contacted: 12/18/19 Time DME Agency Contacted: 0900 Representative spoke with at DME Agency: Ovid Curd HH Arranged: PT [place]: [facility] (formerly [employer]) Date [place]: 12/18/19 Time [place]: 1036 Representative spoke with at [place]: [place] ([place]) Interventions    Readmission Risk Interventions No flowsheet data found.

## 2019-12-18 NOTE — Anesthesia Postprocedure Evaluation (Signed)
Anesthesia Post Note  Patient: Glen Bolton  Procedure(s) Performed: RIGHT TOTAL KNEE ARTHROPLASTY (Right Knee)     Patient location during evaluation: PACU Anesthesia Type: Spinal Level of consciousness: oriented and awake and alert Pain management: pain level controlled Vital Signs Assessment: post-procedure vital signs reviewed and stable Respiratory status: spontaneous breathing, respiratory function stable and patient connected to nasal cannula oxygen Cardiovascular status: blood pressure returned to baseline and stable Postop Assessment: no headache, no backache and no apparent nausea or vomiting Anesthetic complications: no    Last Vitals:  Vitals:   12/18/19 0611 12/18/19 1001  BP: 118/74 (!) 107/48  Pulse: (!) 59 68  Resp: 16 16  Temp: 36.5 C 36.6 C  SpO2: 97% 96%    Last Pain:  Vitals:   12/18/19 1144  TempSrc:   PainSc: 0-No pain                 Tiajuana Amass

## 2019-12-18 NOTE — Progress Notes (Signed)
Patient discharged to home w/ wife. Given all belongings, instructions, equipment. Verbalized understanding of all instructions. Escorted to pov via w/c. 

## 2019-12-18 NOTE — Progress Notes (Signed)
PATIENT ID: Glen Bolton  MRN: [id number]  DOB/AGE:  [birth year] / 79 y.o.  1 Day Post-Op Procedure(s) (LRB): RIGHT TOTAL KNEE ARTHROPLASTY (Right)    PROGRESS NOTE Subjective: Patient is alert, oriented, no Nausea, no Vomiting, yes passing gas. Taking PO well. Denies SOB, Chest or Calf Pain. Using Incentive Spirometer, PAS in place. Ambulate in room, Patient reports pain as 2/10 .    Objective: Vital signs in last 24 hours: Vitals:   12/17/19 1941 12/17/19 2220 12/18/19 0143 12/18/19 0611  BP: (Abnormal) 149/70 140/80 114/69 118/74  Pulse: 60 61 60 (Abnormal) 59  Resp: 16 16 16 16   Temp: 97.6 F (36.4 C) 98.2 F (36.8 C) 97.8 F (36.6 C) 97.7 F (36.5 C)  TempSrc: Oral Oral Oral Oral  SpO2: 96% 96% 95% 97%  Weight:      Height:          Intake/Output from previous day: I/O last 3 completed shifts: In: 4165.6 [P.O.:1080; I.V.:2835.6; IV Piggyback:250] Out: [id number] [Urine:2750; Blood:300]   Intake/Output this shift: No intake/output data recorded.   LABORATORY DATA: Recent Labs    12/18/19 0329  WBC 9.5  HGB 14.3  HCT 43.6  PLT 154  NA 136  K 4.3  CL 102  CO2 24  BUN 18  CREATININE 0.86  GLUCOSE 265*  CALCIUM 8.6*    Examination: Neurologically intact ABD soft Neurovascular intact Sensation intact distally Intact pulses distally Dorsiflexion/Plantar flexion intact Incision: dressing C/D/I No cellulitis present Compartment soft}  Assessment:   1 Day Post-Op Procedure(s) (LRB): RIGHT TOTAL KNEE ARTHROPLASTY (Right) ADDITIONAL DIAGNOSIS: Expected Acute Blood Loss Anemia, a fib, HTN  Patient's anticipated LOS is less than 2 midnights, meeting these requirements: - Younger than 66 - Lives within 1 hour of care - Has a competent adult at home to recover with post-op recover - NO history of  - Chronic pain requiring opiods  - Diabetes  - Coronary Artery Disease  - Heart failure  - Heart attack  - Stroke  - DVT/VTE  - Cardiac arrhythmia  -  Respiratory Failure/COPD  - Renal failure  - Anemia  - Advanced Liver disease       Plan: PT/OT WBAT, AROM and PROM  DVT Prophylaxis:  SCDx72hrs, ASA 81 mg BID x 2 weeks DISCHARGE PLAN: Home today when cleared by PT DISCHARGE NEEDS: HHPT, Walker and 3-in-1 comode seat     Kerin Salen 12/18/2019, 7:56 AM Patient ID: Glen Bolton, male   DOB: February 17, 1941, 79 y.o.   MRN: [id number]

## 2019-12-18 NOTE — Progress Notes (Signed)
   12/18/19 1335  PT Visit Information  Last PT Received On 12/18/19  Pt continues to progress very well. Pain controlled, reviewed up/down one step and use of ice. Pt is ready for d/c from PT standpoint   Assistance Needed +1  History of Present Illness s/p R TKA; PMH: afib, L THA, R-RCR  Subjective Data  Patient Stated Goal home today  Precautions  Precautions Knee  Restrictions  Weight Bearing Restrictions No  RLE Weight Bearing WBAT  Pain Assessment  Pain Assessment 0-10  Pain Score 3  Pain Location right knee  Pain Descriptors / Indicators Discomfort;Sore  Pain Intervention(s) Limited activity within patient's tolerance;Monitored during session;Repositioned;Other (comment) (pt to get pain meds before d/c)  Cognition  Arousal/Alertness Awake/alert  Behavior During Therapy [facility] for tasks assessed/performed  Overall Cognitive Status Within Functional Limits for tasks assessed  Bed Mobility  General bed mobility comments in chair   Transfers  Overall transfer level Needs assistance  Transfers Sit to/from Stand  Sit to Stand Supervision  General transfer comment cues for safety and hand placement   Ambulation/Gait  Ambulation/Gait assistance Supervision  Gait Distance (Feet) 60 Feet  Assistive device Rolling walker (2 wheeled)  Gait Pattern/deviations Step-to pattern;Step-through pattern;Decreased stance time - right  General Gait Details cues for RW position, posture   Stairs Yes  Stairs assistance Min guard  Stair Management No rails;Step to pattern;Forwards;With walker  Number of Stairs 1  General stair comments cues for sequence and safe technique. wife present   PT - End of Session  Equipment Utilized During Treatment Gait belt  Activity Tolerance Patient tolerated treatment well  Patient left in chair;with call bell/phone within reach;with chair alarm set;with family/visitor present  Nurse Communication Mobility status   PT - Assessment/Plan  PT Plan Current plan  remains appropriate  PT Visit Diagnosis Difficulty in walking, not elsewhere classified (R26.2)  PT Frequency (ACUTE ONLY) 7X/week  Follow Up Recommendations Follow surgeon's recommendation for DC plan and follow-up therapies  PT equipment Rolling walker with 5" wheels;3in1 (PT)  AM-PAC PT "6 Clicks" Mobility Outcome Measure (Version 2)  Help needed turning from your back to your side while in a flat bed without using bedrails? 3  Help needed moving from lying on your back to sitting on the side of a flat bed without using bedrails? 3  Help needed moving to and from a bed to a chair (including a wheelchair)? 4  Help needed standing up from a chair using your arms (e.g., wheelchair or bedside chair)? 4  Help needed to walk in hospital room? 4  Help needed climbing 3-5 steps with a railing?  3  6 Click Score 21  Consider Recommendation of Discharge To: Home with no services  PT Goal Progression  Progress towards PT goals Progressing toward goals  Acute Rehab PT Goals  PT Goal Formulation With patient  Time For Goal Achievement 12/24/19  Potential to Achieve Goals Good  PT Time Calculation  PT Start Time (ACUTE ONLY) 1305  PT Stop Time (ACUTE ONLY) 1317  PT Time Calculation (min) (ACUTE ONLY) 12 min  PT General Charges  $$ ACUTE PT VISIT 1 Visit  PT Treatments  $Gait Training 8-22 mins

## 2019-12-19 ENCOUNTER — Inpatient Hospital Stay (HOSPITAL_BASED_OUTPATIENT_CLINIC_OR_DEPARTMENT_OTHER): Payer: Medicare Other | Admitting: Hematology

## 2019-12-19 ENCOUNTER — Encounter: Payer: Self-pay | Admitting: *Deleted

## 2019-12-19 DIAGNOSIS — R768 Other specified abnormal immunological findings in serum: Secondary | ICD-10-CM

## 2019-12-19 DIAGNOSIS — D472 Monoclonal gammopathy: Secondary | ICD-10-CM

## 2019-12-19 NOTE — Progress Notes (Signed)
HEMATOLOGY/ONCOLOGY CLINIC NOTE  Date of Service: 12/19/2019  Patient Care Team: Lujean Amel, MD as PCP - General (Family Medicine) Minus Breeding, MD as PCP - Cardiology (Cardiology)  CHIEF COMPLAINTS/PURPOSE OF CONSULTATION:  Elevated serum kappa light chains  HISTORY OF PRESENTING ILLNESS:   Glen Bolton is a wonderful 79 y.o. male who has been referred to [place] by my colleague Dr Grace Isaac for evaluation and management of Elevated serum kappa light chains. He is accompanied today by his wife. The pt reports that he is doing well overall.   The pt reports that he had no symptoms before his abnormal lab results prompting his initial visit with Dr Lebron Conners. He denies any concerns for inflammation besides an ear infection for the last 3 weeks and a viral infection as well. He is being followed by his PCP and has had a 20dB drop in his right ear threshold. He has had upper respiratory symptoms including a bad cough that hurt his abdomen which is coupled by a bruise at his lower abdomen. He denies any trauma to the area but does take Xarelto for his Afib. He has been treated with a z-pack and other antibiotics. He has had tubes put in his ear 3 times in the past but denies any ear pain.   He notes that he has arthritis in his knees but denies concern for rheumatoid arthritis.   Of note prior to the patient's visit today, pt has had PET/CT completed on 02/06/18 with results revealing 1. No hypermetabolic mass or adenopathy identified. 2. No evidence for hepatomegaly or splenomegaly. 3. Mild low level FDG uptake throughout the bone marrow is noted. Nonspecific and may be physiologic. 4.  Aortic Atherosclerosis (ICD10-I70.0). 5. Bilateral maxillary sinus disease.   Surgical pathology 02/03/18 revealed HYPERCELLULAR BONE MARROW FOR AGE WITH TRILINEAGE HEMATOPOIESIS. - A MINOR PLASMA CELL COMPONENT WITH KAPPA LIGHT CHAIN EXCESS. - A FEW SMALL LYMPHOID AGGREGATES PRESENT.   Most recent lab  results (02/03/18) of CBC w/ diff  is as follows: all values are WNL. UPEP Light chains 01/26/18 showed Total Protein ur/day at 156, Free kappa lt chains, ur at 93.20, Free lambda lt chains at 7.61, Free Kappa/Lambda ratio at 12.25.  MMP 01/24/18 showed all values WNL except for IgM at 244. M Protein not observed.   On review of systems, pt reports cough, ear infection, lower abdomen superficial bruising, and denies fevers, chills, night sweats, leg swelling, testicular pain or swelling, and any other symptoms.   Interval History:  I connected with  [facility] Nurse on 12/19/19 by telephone and verified that I am speaking with the correct person using two identifiers.   I discussed the limitations of evaluation and management by telemedicine. The patient expressed understanding and agreed to proceed.  Other persons participating in the visit and their role in the encounter:        -Yevette Edwards, Medical Scribe  Patient's location: Home Provider's location: [place] at [facility] returns today for management and evaluation of his elevated serum free light chains. The patient's last visit with [place] was on 06/20/2019. The pt reports that he is doing well overall.  The pt reports that he has been well. Pt had a knee replacement yesterday and has been able to get around with a walker today. His neuropathy appears to have improved some and is not limiting at this time. His Diabetes has been stable.  Of note since the patient's last visit, pt has had ECHO  completed on 12/05/2019 with results revealing "1. Left ventricular ejection fraction, by estimation, is 60 to 65%. The left ventricle has normal function. The left ventricle has no regional wall motion abnormalities. Left ventricular diastolic function could not be evaluated. 2. Right ventricular systolic function is mildly reduced. The right ventricular size is normal. There is moderately elevated pulmonary artery systolic pressure. 3. Left  atrial size was moderately dilated. 4. Right atrial size was mildly dilated. 5. The mitral valve is normal in structure. Trivial mitral valve regurgitation. No evidence of mitral stenosis. 6. The aortic valve is tricuspid. Aortic valve regurgitation is not visualized. No aortic stenosis is present. 7. The inferior vena cava is normal in size with <50% respiratory variability, suggesting right atrial pressure of 8 mmHg."  Lab results (12/12/19) of CBC w/diff is as follows: all values are WNL. 12/12/2019 MMP is as follows: all values are WNL except for IgM at 230 12/12/2019 K/L light chain ratio is as follows: Kappa free light chain at 163.0, Lamda free light chains at 17.7, K/L light chain ratio at 9.21  On review of systems, pt reports improving neuropathy and denies any other symptoms.   MEDICAL HISTORY:  Past Medical History:  Diagnosis Date  . Anxiety   . Arthritis   . Atrial fibrillation ([place])   . Borderline diabetic   . Dysrhythmia   . GERD (gastroesophageal reflux disease)   . Glaucoma   . Hard of hearing   . History of gastritis   . History of kidney stones   . History of stomach ulcers   . Hx of migraines    no migraines since he was in his 89's  . Hypertension   . Peripheral neuropathy 12/26/2017  . Pneumonia   . Sleep apnea    CPAP  not currently working  couldnt breathe with it laying down trying something different end of  April or May 2021    SURGICAL HISTORY: Past Surgical History:  Procedure Laterality Date  . CATARACT EXTRACTION Bilateral 2014  . COLONOSCOPY    . ESOPHAGOGASTRODUODENOSCOPY    . EYE SURGERY    . INGUINAL HERNIA REPAIR Left    x 2  . Rotator cuff surgery Right 2013  . TOTAL HIP ARTHROPLASTY Left 10/27/2015   Procedure: TOTAL HIP ARTHROPLASTY ANTERIOR APPROACH;  Surgeon: Frederik Pear, MD;  Location: [place];  Service: Orthopedics;  Laterality: Left;  . TOTAL KNEE ARTHROPLASTY Right 12/17/2019   Procedure: RIGHT TOTAL KNEE ARTHROPLASTY;  Surgeon:  Frederik Pear, MD;  Location: [facility];  Service: Orthopedics;  Laterality: Right;    SOCIAL HISTORY: Social History   Socioeconomic History  . Marital status: Married    Spouse name: Not on file  . Number of children: 2  . Years of education: Not on file  . Highest education level: Not on file  Occupational History  . Occupation: Golf course  Tobacco Use  . Smoking status: Never Smoker  . Smokeless tobacco: Never Used  Substance and Sexual Activity  . Alcohol use: Yes    Alcohol/week: 1.0 standard drinks    Types: 1 Glasses of wine per week    Comment: daily  . Drug use: No  . Sexual activity: Not Currently  Other Topics Concern  . Not on file  Social History Narrative   Lives with wife   Caffeine use: 2 cups coffee daily   Gun smith   Right handed    Social Determinants of Health   Financial Resource Strain:   .  Difficulty of Paying Living Expenses:   Food Insecurity:   . Worried About Charity fundraiser in the Last Year:   . Arboriculturist in the Last Year:   Transportation Needs:   . Film/video editor (Medical):   Marland Kitchen Lack of Transportation (Non-Medical):   Physical Activity:   . Days of Exercise per Week:   . Minutes of Exercise per Session:   Stress:   . Feeling of Stress :   Social Connections:   . Frequency of Communication with Friends and Family:   . Frequency of Social Gatherings with Friends and Family:   . Attends Religious Services:   . Active Member of Clubs or Organizations:   . Attends Archivist Meetings:   Marland Kitchen Marital Status:   Intimate Partner Violence:   . Fear of Current or Ex-Partner:   . Emotionally Abused:   Marland Kitchen Physically Abused:   . Sexually Abused:     FAMILY HISTORY: Family History  Problem Relation Age of Onset  . Hyperlipidemia Brother   . Stroke Sister 47    ALLERGIES:  has No Known Allergies.  MEDICATIONS:  Current Outpatient Medications  Medication Sig Dispense Refill  . dorzolamide-timolol (COSOPT)  22.3-6.8 MG/ML ophthalmic solution Place 1 drop into both eyes 2 (two) times daily.   8  . hydrochlorothiazide (HYDRODIURIL) 12.5 MG tablet Take 12.5 mg by mouth every evening.     . metoprolol succinate (TOPROL-XL) 50 MG 24 hr tablet TAKE 1 TABLET BY MOUTH DAILY WITH OR IMMEDIATELY AFTER A MEAL (Patient taking differently: Take 50 mg by mouth daily. ) 90 tablet 3  . Multiple Vitamin (MULTIVITAMIN WITH MINERALS) TABS tablet Take 1 tablet by mouth daily.    Marland Kitchen olmesartan (BENICAR) 40 MG tablet Take 40 mg by mouth daily.   4  . oxyCODONE-acetaminophen (PERCOCET/ROXICET) 5-325 MG tablet Take 1 tablet by mouth every 4 (four) hours as needed for severe pain. 30 tablet 0  . Probiotic Product (PROBIOTIC DAILY PO) Take 1 capsule by mouth every evening.    . rivaroxaban (XARELTO) 10 MG TABS tablet Take 1 tablet (10 mg total) by mouth daily. 30 tablet 0  . tiZANidine (ZANAFLEX) 2 MG tablet Take 1 tablet (2 mg total) by mouth every 6 (six) hours as needed. 60 tablet 0   No current facility-administered medications for this visit.    REVIEW OF SYSTEMS:   A 10+ POINT REVIEW OF SYSTEMS WAS OBTAINED including neurology, dermatology, psychiatry, cardiac, respiratory, lymph, extremities, GI, GU, Musculoskeletal, constitutional, breasts, reproductive, HEENT.  All pertinent positives are noted in the HPI.  All others are negative.   PHYSICAL EXAMINATION:  Telehealth visit   LABORATORY DATA:  I have reviewed the data as listed  . CBC Latest Ref Rng & Units 12/18/2019 12/12/2019 06/15/2019  WBC 4.0 - 10.5 K/uL 9.5 6.8 5.0  Hemoglobin 13.0 - 17.0 g/dL 14.3 15.5 15.4  Hematocrit 39.0 - 52.0 % 43.6 48.2 45.9  Platelets 150 - 400 K/uL 154 161 148(L)    . CMP Latest Ref Rng & Units 12/18/2019 12/12/2019 06/15/2019  Glucose 70 - 99 mg/dL 265(H) 114(H) 117(H)  BUN 8 - 23 mg/dL 18 22 22   Creatinine 0.61 - 1.24 mg/dL 0.86 0.80 0.82  Sodium 135 - 145 mmol/L 136 140 143  Potassium 3.5 - 5.1 mmol/L 4.3 4.2 4.0  Chloride  98 - 111 mmol/L 102 105 107  CO2 22 - 32 mmol/L 24 27 27   Calcium 8.9 - 10.3 mg/dL  8.6(L) 9.5 8.8(L)  Total Protein 6.5 - 8.1 g/dL - - 6.7  Total Bilirubin 0.3 - 1.2 mg/dL - - 0.9  Alkaline Phos 38 - 126 U/L - - 62  AST 15 - 41 U/L - - 28  ALT 0 - 44 U/L - - 29   02/03/18 BM Report:    02/03/18 Flow Cytometry:   02/03/18 Cytogenetics:    RADIOGRAPHIC STUDIES: I have personally reviewed the radiological images as listed and agreed with the findings in the report. ECHOCARDIOGRAM COMPLETE  Result Date: 12/06/2019    ECHOCARDIOGRAM REPORT   Patient Name:   LINKYN HENDLER Date of Exam: 12/05/2019 Medical Rec #:  [id number]   Height:       70.0 in Accession #:    [id number]  Weight:       268.0 lb Date of Birth:  October 08, 1940  BSA:          2.364 m Patient Age:    37 years    BP:           124/64 mmHg Patient Gender: M           HR:           82 bpm. Exam Location:  Outpatient Procedure: 2D Echo, Cardiac Doppler and Color Doppler Indications:    I48.2 Chronic atrial fibrillation  History:        Patient has prior history of Echocardiogram examinations, most                 recent 09/10/2015. Arrythmias:Atrial Fibrillation; Risk                 Factors:Hypertension.  Sonographer:    Vikki Ports Turrentine Referring Phys: [id number] [place]  1. Left ventricular ejection fraction, by estimation, is 60 to 65%. The left ventricle has normal function. The left ventricle has no regional wall motion abnormalities. Left ventricular diastolic function could not be evaluated.  2. Right ventricular systolic function is mildly reduced. The right ventricular size is normal. There is moderately elevated pulmonary artery systolic pressure.  3. Left atrial size was moderately dilated.  4. Right atrial size was mildly dilated.  5. The mitral valve is normal in structure. Trivial mitral valve regurgitation. No evidence of mitral stenosis.  6. The aortic valve is tricuspid. Aortic valve regurgitation is not  visualized. No aortic stenosis is present.  7. The inferior vena cava is normal in size with <50% respiratory variability, suggesting right atrial pressure of 8 mmHg. Comparison(s): No significant change from prior study. FINDINGS  Left Ventricle: Left ventricular ejection fraction, by estimation, is 60 to 65%. The left ventricle has normal function. The left ventricle has no regional wall motion abnormalities. The left ventricular internal cavity size was normal in size. There is  no left ventricular hypertrophy. Left ventricular diastolic function could not be evaluated due to atrial fibrillation. Left ventricular diastolic function could not be evaluated. Right Ventricle: The right ventricular size is normal. Right vetricular wall thickness was not assessed. Right ventricular systolic function is mildly reduced. There is moderately elevated pulmonary artery systolic pressure. The tricuspid regurgitant velocity is 2.96 m/s, and with an assumed right atrial pressure of 8 mmHg, the estimated right ventricular systolic pressure is 0000000 mmHg. Left Atrium: Left atrial size was moderately dilated. Right Atrium: Right atrial size was mildly dilated. Pericardium: There is no evidence of pericardial effusion. Mitral Valve: The mitral valve is normal in structure. Trivial mitral valve regurgitation. No evidence of  mitral valve stenosis. Tricuspid Valve: The tricuspid valve is normal in structure. Tricuspid valve regurgitation is trivial. No evidence of tricuspid stenosis. Aortic Valve: The aortic valve is tricuspid. . There is mild thickening and mild calcification of the aortic valve. Aortic valve regurgitation is not visualized. No aortic stenosis is present. There is mild thickening of the aortic valve. There is mild calcification of the aortic valve. Pulmonic Valve: The pulmonic valve was not well visualized. Pulmonic valve regurgitation is not visualized. No evidence of pulmonic stenosis. Aorta: The aortic root,  ascending aorta and aortic arch are all structurally normal, with no evidence of dilitation or obstruction. Venous: The inferior vena cava is normal in size with less than 50% respiratory variability, suggesting right atrial pressure of 8 mmHg. IAS/Shunts: No atrial level shunt detected by color flow Doppler.  LEFT VENTRICLE PLAX 2D LVIDd:         5.30 cm LVIDs:         3.20 cm LV PW:         1.10 cm LV IVS:        1.10 cm LVOT diam:     1.90 cm LV SV:         57 LV SV Index:   24 LVOT Area:     2.84 cm  RIGHT VENTRICLE RV S prime:     12.70 cm/s TAPSE (M-mode): 1.5 cm LEFT ATRIUM              Index       RIGHT ATRIUM           Index LA diam:        5.70 cm  2.41 cm/m  RA Area:     22.00 cm LA Vol (A2C):   94.8 ml  40.11 ml/m RA Volume:   61.70 ml  26.10 ml/m LA Vol (A4C):   113.0 ml 47.80 ml/m LA Biplane Vol: 103.0 ml 43.57 ml/m  AORTIC VALVE LVOT Vmax:   93.77 cm/s LVOT Vmean:  66.500 cm/s LVOT VTI:    0.200 m  AORTA Ao Root diam: 2.60 cm MITRAL VALVE                TRICUSPID VALVE MV Area (PHT): 5.27 cm     TR Peak grad:   35.0 mmHg MV Decel Time: 144 msec     TR Vmax:        296.00 cm/s MV E velocity: 101.00 cm/s                             SHUNTS                             Systemic VTI:  0.20 m                             Systemic Diam: 1.90 cm Buford Dresser MD Electronically signed by Buford Dresser MD Signature Date/Time: 12/06/2019/10:43:47 AM    Final     ASSESSMENT & PLAN:   79 y.o. male with  1. Light chain MGUS  PET/CT from 02/06/18 did not reveal any bone lesions and revealed Mild low level FDG uptake throughout the bone marrow is noted.  Labs upon initial presentation from 02/03/18 and 01/24/18, Free kappa lt chains in the urine were elevated at 93.20. No anemia, no abnormal kidney functions. Nonspecific  and may be physiologic.  IgM increase noted without M-spike 02/03/18 BM bx pathology indicated 2% plasma cells 02/03/18 Cytogenetics showed normal genetics without  specific mutation Slow moving lymphoma or plasma cell disorder vs reactive to chronic inflammation with frequent sinus and ear infections ?  2. Significant Pulmonary Arterial HTN - likely due to suboptimally treated sleep apnea  -Will defer further management (and referral to Pulmonary/Cardiology) to PCP -No overt findings suggestive of cardiac amyloidosis  PLAN: -Discussed pt labwork, 12/12/19; all values are WNL. -Discussed 12/12/2019 MMP shows no M Protein -Discussed 12/12/2019 K/L light chain ratios are stable  -Discussed 12/05/2019 ECHO which revealed "1. Left ventricular ejection fraction, by estimation, is 60 to 65%. The left ventricle has normal function. The left ventricle has no regional wall motion abnormalities. Left ventricular diastolic function could not be evaluated. 2. Right ventricular systolic function is mildly reduced. The right ventricular size is normal. There is moderately elevated pulmonary artery systolic pressure. 3. Left atrial size was moderately dilated. 4. Right atrial size was mildly dilated. 5. The mitral valve is normal in structure. Trivial mitral valve regurgitation. No evidence of mitral stenosis. 6. The aortic valve is tricuspid. Aortic valve regurgitation is not visualized. No aortic stenosis is present. 7. The inferior vena cava is normal in size with <50% respiratory variability, suggesting right atrial pressure of 8 mmHg." -Will continue watchful observation at this time - pt prefers this  -Will move follow-ups to every 12 months due to stability of SFLC/no evidence of pathology -If labs or symptoms change, would consider re-evaluation with a repeat BM Bx -Follow up with PCP for age-appropriate cancer screening and symptom-directed work up -Continue follow up with Neurology for management of neuropathy -Recommend pt f/u with PCP regarding Pulmonary HTN -Will see back in 12 months, with labs 1 week prior   FOLLOW UP: RTC with Dr Irene Limbo in 12 months, PLz  schedule labs 1 week prior to clinic visit   The total time spent in the appt was 20 minutes and more than 50% was on counseling and direct patient cares.  All of the patient's questions were answered with apparent satisfaction. The patient knows to call the clinic with any problems, questions or concerns.   Sullivan Lone MD [place] AAHIVMS [facility] Hematology/Oncology Physician [facility]  (Office):       [phone] (Work cell):  [phone] (Fax):           [phone]  12/19/2019 3:58 PM  I, Yevette Edwards, am acting as a scribe for Dr. Sullivan Lone.   .I have reviewed the above documentation for accuracy and completeness, and I agree with the above. Brunetta Genera MD

## 2019-12-20 ENCOUNTER — Telehealth: Payer: Self-pay | Admitting: Hematology

## 2019-12-20 NOTE — Telephone Encounter (Signed)
Scheduled per 04/14 los, patient has been called and notified. ?

## 2019-12-31 ENCOUNTER — Ambulatory Visit (HOSPITAL_COMMUNITY): Payer: Medicare Other

## 2020-01-13 ENCOUNTER — Other Ambulatory Visit: Payer: Self-pay | Admitting: Cardiology

## 2020-01-14 ENCOUNTER — Ambulatory Visit (HOSPITAL_COMMUNITY)
Admission: RE | Admit: 2020-01-14 | Discharge: 2020-01-14 | Disposition: A | Discharge status: Home / Self Care | Payer: Medicare Other | Source: Ambulatory Visit | Attending: Cardiovascular Disease | Admitting: Cardiovascular Disease

## 2020-01-14 ENCOUNTER — Other Ambulatory Visit: Payer: Self-pay

## 2020-01-14 DIAGNOSIS — I739 Peripheral vascular disease, unspecified: Secondary | ICD-10-CM | POA: Insufficient documentation

## 2020-01-14 DIAGNOSIS — R0989 Other specified symptoms and signs involving the circulatory and respiratory systems: Secondary | ICD-10-CM | POA: Insufficient documentation

## 2020-01-14 DIAGNOSIS — R209 Unspecified disturbances of skin sensation: Secondary | ICD-10-CM

## 2020-01-14 DIAGNOSIS — R6889 Other general symptoms and signs: Secondary | ICD-10-CM | POA: Diagnosis not present

## 2020-03-03 ENCOUNTER — Ambulatory Visit: Payer: Medicare Other | Admitting: Cardiovascular Disease

## 2020-03-03 ENCOUNTER — Encounter: Payer: Self-pay | Admitting: Cardiovascular Disease

## 2020-03-03 ENCOUNTER — Other Ambulatory Visit: Payer: Self-pay

## 2020-03-03 VITALS — BP 128/82 | HR 67 | Ht 70.0 in | Wt 260.8 lb

## 2020-03-03 DIAGNOSIS — I482 Chronic atrial fibrillation, unspecified: Secondary | ICD-10-CM | POA: Diagnosis not present

## 2020-03-03 DIAGNOSIS — G4733 Obstructive sleep apnea (adult) (pediatric): Secondary | ICD-10-CM | POA: Diagnosis not present

## 2020-03-03 DIAGNOSIS — R768 Other specified abnormal immunological findings in serum: Secondary | ICD-10-CM

## 2020-03-03 DIAGNOSIS — I10 Essential (primary) hypertension: Secondary | ICD-10-CM | POA: Diagnosis not present

## 2020-03-03 NOTE — Progress Notes (Signed)
ID:  Glen Bolton, DOB 1940-09-14, MRN [id number]  PCP:  Lujean Amel, MD                    Cardiologist:   Minus Breeding, MD   HPI: Glen Bolton is a 79 y.o. male who presents for one-year follow-up sleep evaluation  Glen Bolton followed by Dr. Percival Bolton for cardiology care and has a history of atrial fibrillation, hypertension, and GERD.  Due to concerns for obstructive sleep apnea.  He was referred for diagnostic polysomnogram which was done on 11/20/2015.  This confirmed moderate obstructive sleep apnea with an overall AHI of 18.2 per hour.  Events were worse with supine sleep with an HI of 21.6 per hour.  He had significant oxygen desaturation to 79%.  There was reduced sleep efficiency of only 64.4%.  He had loud snoring.  He was in atrial fibrillation for the duration of the study and there were occasional PVCs.  He ultimately underwent a complete CPAP titration study on 01/12/2016 and CPAP was titrated up to 13 cm water pressure.  He has been on CPAP therapy since June.  His DME [facility].  A download from June 29 through 04/02/2016 revealed 83% of days with usage and 70% of days with usage greater than 4 hours.  He was just meeting Medicare compliance standards.  His average usage per night was 5 hours and 26 minutes.  At this at pressure of 13 cm, AHI was excellent at 0.7 despite a significant mask leak at 45.1 L/m.  He has been using nasal pillows.  Typically goes to bed at 10 PM, but wakes up between 5 and 6 AM.  He has had difficulty with dry mouth as well as intermittent nasal congestion.  He feels that he has more energy since initiating CPAP therapy.  He is unaware of any breakthrough snoring.  An Epworth scale following CPAP initiation in 2017 and this endorsed at 4 and shown below arguing against residual daytime sleepiness.  Epworth Sleepiness Scale: Situation   Chance of Dozing/Sleeping (0 = never , 1 = slight chance , 2 = moderate chance , 3 = high chance )    sitting and reading 1   watching TV 1   sitting inactive in a public place 0   being a passenger in a motor vehicle for an hour or more 0   lying down in the afternoon 1   sitting and talking to someone 0   sitting quietly after lunch (no alcohol) 1   while stopped for a few minutes in traffic as the driver 0   Total Score  4    I last saw Glen Bolton in September 2018 and over the previous year he continued to use CPAP therapy.However, for the last several months he has had problems with nasal congestion as well as postnasal drip.  When his nose gets congested he has not been using CPAP and average use is approximately 4 days per week.  He feels improved since he has been using CPAP.  His prior 3 times per night nocturia is now down to 0-1 time per night.  His sleep is restorative.  He is unaware of breakthrough snoring.  He denies residual daytime sleepiness. He typically goes to bed between 9 and 10 PM and wakes up at 6 AM.  An Epworth Sleepiness Scale score was recalculated in the office today and this endorsed at 8, again arguing against excessive daytime  sleepiness.  Since I last saw him, he was evaluated by Micah Flesher in February 2021 at that time stated that he was not typically compliant with CPAP due to his postnasal drip and nasal mask.  As result, he has not been using CPAP.  We try to obtain a download today and there was no recent use.  An old download however did show at 13 cm water pressure his AHI was excellent at 0.7.  He cannot sleep on his back.  He has undergone knee replacement surgery in March.  As result he has had some pain while sleeping.  He has been on hydrochlorothiazide 12.5 mg, metoprolol succinate 50 mg, and olmesartan 40 mg daily for hypertension.  He is anticoagulated on Xarelto.  He is followed in hematology clinic with elevated serum kappa light chains.  He presents for evaluation.  Past Medical History:  Diagnosis Date  . Anxiety   . Arthritis   . Atrial  fibrillation (HCC)   . Atrial fibrillation, chronic 08/26/2015  . Borderline diabetic   . Chronic anticoagulation 12/04/2018   CHADS VASC-3-Xarelto  . Dysrhythmia   . GERD (gastroesophageal reflux disease)   . Glaucoma   . Hard of hearing   . History of gastritis   . History of kidney stones   . History of stomach ulcers   . Hx of migraines    no migraines since he was in his 30's  . Hypertension   . Osteoarthritis of right knee 12/13/2019  . Peripheral neuropathy 12/26/2017  . Pneumonia   . Primary osteoarthritis of left hip 10/25/2015  . Sleep apnea    CPAP  not currently working  couldnt breathe with it laying down trying something different end of  April or May 2021    Past Surgical History:  Procedure Laterality Date  . CATARACT EXTRACTION Bilateral 2014  . COLONOSCOPY    . ESOPHAGOGASTRODUODENOSCOPY    . EYE SURGERY    . INGUINAL HERNIA REPAIR Left    x 2  . Rotator cuff surgery Right 2013  . TOTAL HIP ARTHROPLASTY Left 10/27/2015   Procedure: TOTAL HIP ARTHROPLASTY ANTERIOR APPROACH;  Surgeon: Gean Birchwood, MD;  Location: [facility];  Service: Orthopedics;  Laterality: Left;  . TOTAL KNEE ARTHROPLASTY Right 12/17/2019   Procedure: RIGHT TOTAL KNEE ARTHROPLASTY;  Surgeon: Gean Birchwood, MD;  Location: [facility];  Service: Orthopedics;  Laterality: Right;    No Known Allergies  Current Outpatient Medications  Medication Sig Dispense Refill  . dorzolamide-timolol (COSOPT) 22.3-6.8 MG/ML ophthalmic solution Place 1 drop into both eyes 2 (two) times daily.   8  . hydrochlorothiazide (HYDRODIURIL) 12.5 MG tablet Take 12.5 mg by mouth every evening.     . metoprolol succinate (TOPROL-XL) 50 MG 24 hr tablet Take 1 tablet (50 mg total) by mouth daily. 90 tablet 2  . Multiple Vitamin (MULTIVITAMIN WITH MINERALS) TABS tablet Take 1 tablet by mouth daily.    Marland Kitchen olmesartan (BENICAR) 40 MG tablet Take 40 mg by mouth daily.   4  . Probiotic Product (PROBIOTIC DAILY PO) Take 1 capsule by mouth  every evening.    . rivaroxaban (XARELTO) 10 MG TABS tablet Take 1 tablet (10 mg total) by mouth daily. 30 tablet 0   No current facility-administered medications for this visit.    Social History   Socioeconomic History  . Marital status: Married    Spouse name: Not on file  . Number of children: 2  . Years of education: Not on  file  . Highest education level: Not on file  Occupational History  . Occupation: Golf course  Tobacco Use  . Smoking status: Never Smoker  . Smokeless tobacco: Never Used  Vaping Use  . Vaping Use: Never used  Substance and Sexual Activity  . Alcohol use: Yes    Alcohol/week: 1.0 standard drink    Types: 1 Glasses of wine per week    Comment: daily  . Drug use: No  . Sexual activity: Not Currently  Other Topics Concern  . Not on file  Social History Narrative   Lives with wife   Caffeine use: 2 cups coffee daily   Gun smith   Right handed    Social Determinants of Health   Financial Resource Strain:   . Difficulty of Paying Living Expenses:   Food Insecurity:   . Worried About Charity fundraiser in the Last Year:   . Arboriculturist in the Last Year:   Transportation Needs:   . Film/video editor (Medical):   Marland Kitchen Lack of Transportation (Non-Medical):   Physical Activity:   . Days of Exercise per Week:   . Minutes of Exercise per Session:   Stress:   . Feeling of Stress :   Social Connections:   . Frequency of Communication with Friends and Family:   . Frequency of Social Gatherings with Friends and Family:   . Attends Religious Services:   . Active Member of Clubs or Organizations:   . Attends Archivist Meetings:   Marland Kitchen Marital Status:   Intimate Partner Violence:   . Fear of Current or Ex-Partner:   . Emotionally Abused:   Marland Kitchen Physically Abused:   . Sexually Abused:    Additional social history is notable that is that he works at [employer] course.He is now working 5 days per week at this golf course and  1 day at [employer] [place] course.  Family History  Problem Relation Age of Onset  . Hyperlipidemia Brother   . Stroke Sister 61     ROS General: Negative; No fevers, chills, or night sweats HEENT: Negative; No changes in vision or hearing, sinus congestion, difficulty swallowing Pulmonary: Negative; No cough, wheezing, shortness of breath, hemoptysis Cardiovascular: Permanent atrial fibrillation GI: Negative; No nausea, vomiting, diarrhea, or abdominal pain GU: Negative; No dysuria, hematuria, or difficulty voiding Musculoskeletal: Negative; no myalgias, joint pain, or weakness Hematologic: Elevated serum kappa light chains Endocrine: Negative; no heat/cold intolerance Neuro: Negative; no changes in balance, headaches Skin: Negative; No rashes or skin lesions Psychiatric: Negative; No behavioral problems, depression Sleep: see above;    Physical Exam BP 128/82   Pulse 67   Ht 5' 10"  (1.778 m)   Wt 260 lb 12.8 oz (118.3 kg)   SpO2 97%   BMI 37.42 kg/m    Repeat by me 140/82  Wt Readings from Last 3 Encounters:  03/03/20 260 lb 12.8 oz (118.3 kg)  12/17/19 275 lb 5.7 oz (124.9 kg)  12/12/19 268 lb 6 oz (121.7 kg)   General: Alert, oriented, no distress.  Skin: normal turgor, no rashes, warm and dry HEENT: Normocephalic, atraumatic. Pupils equal round and reactive to light; sclera anicteric; extraocular muscles intact; Nose without nasal septal hypertrophy Mouth/Parynx benign; Mallinpatti scale 3/4 Neck: No JVD, no carotid bruits; normal carotid upstroke Lungs: clear to ausculatation and percussion; no wheezing or rales Chest wall: without tenderness to palpitation Heart: PMI not displaced, RRR, s1 s2 normal, 1/6 systolic murmur,  no diastolic murmur, no rubs, gallops, thrills, or heaves Abdomen: soft, nontender; no hepatosplenomehaly, BS+; abdominal aorta nontender and not dilated by palpation. Back: no CVA tenderness Pulses 2+ Musculoskeletal: full range of  motion, normal strength, no joint deformities Extremities: no clubbing cyanosis or edema, Homan's sign negative  Neurologic: grossly nonfocal; Cranial nerves grossly wnl Psychologic: Normal mood and affect  ECG (independently read by me): Atrial fibrillation at 75 QSV1-18 May 2017 ECG (independently read by me): Atrial fibrillation at 71 bpm.  PVC.  ECG from 08/26/2015 was reviewed which showed atrial fibrillation at 93 bpm.  LABS:  BMP Latest Ref Rng & Units 12/18/2019 12/12/2019 06/15/2019  Glucose 70 - 99 mg/dL 265(H) 114(H) 117(H)  BUN 8 - 23 mg/dL 18 22 22   Creatinine 0.61 - 1.24 mg/dL 0.86 0.80 0.82  Sodium 135 - 145 mmol/L 136 140 143  Potassium 3.5 - 5.1 mmol/L 4.3 4.2 4.0  Chloride 98 - 111 mmol/L 102 105 107  CO2 22 - 32 mmol/L 24 27 27   Calcium 8.9 - 10.3 mg/dL 8.6(L) 9.5 8.8(L)     Hepatic Function Latest Ref Rng & Units 06/15/2019 12/08/2018 06/09/2018  Total Protein 6.5 - 8.1 g/dL 6.7 6.8 7.0  Albumin 3.5 - 5.0 g/dL 4.0 3.9 4.0  AST 15 - 41 U/L 28 36 34  ALT 0 - 44 U/L 29 37 34  Alk Phosphatase 38 - 126 U/L 62 76 80  Total Bilirubin 0.3 - 1.2 mg/dL 0.9 0.9 0.9     CBC Latest Ref Rng & Units 12/18/2019 12/12/2019 06/15/2019  WBC 4.0 - 10.5 K/uL 9.5 6.8 5.0  Hemoglobin 13.0 - 17.0 g/dL 14.3 15.5 15.4  Hematocrit 39 - 52 % 43.6 48.2 45.9  Platelets 150 - 400 K/uL 154 161 148(L)     Lipid Panel  No results found for: CHOL, TRIG, HDL, CHOLHDL, VLDL, LDLCALC, LDLDIRECT   RADIOLOGY: No results found.  IMPRESSION:  No diagnosis found.  ASSESSMENT AND PLAN: Mr. Colbie Danner is a 79 year old Caucasian male who has a history of hypertension and permanent atrial fibrillation.  He is on Xarelto for chronic anticoagulation.  He was found to have moderate obstructive sleep apnea on his sleep study from Spring 2017, which was more pronounced with supine posture.  He had reduced sleep efficiency, and had abnormal sleep architecture with absence of slow-wave sleep and  prolonged latency to REM sleep.  He had significant oxygen desaturation to a nadir of 79% and there was evidence for loud snoring.  He has felt improved since initiating CPAP therapy. He has a ResMed AirSense 10 CPAP machine.  Initially treated, he had marked benefit with CPAP use and had a 13 cm of water pressure AHI was excellent at 0.7.  However he felt he with nasal congestion making it very difficult for him to his use his current mask.  He also cannot sleep on his back.  I discussed with him today the significant improved best technology over the years.  I believe he would benefit from the ResMed air fit F 30i mask which is under the nose for the nasal portion and provide him full facemask covered without having the mask extend to the bridge of his nose.  I believe this will be very comfortable.  With the tubing coming from the head he will also have more flexibility in turning while sleeping.  I am changing his CPAP set pressure to an auto mode since he is not use it in several years.  I will change him to a mode of 10 to 20 cm such that as he sleeps on his back he may require higher pressure estimate.  His blood pressure today is stable initially at 128/82 but on repeat by me was 140/82.  He continues to be on HCTZ 12.5 mg, Toprol-XL 50 mg, and olmesartan 40 mg for hypertension control.  He continues to be on Xarelto at low dose.  I have recommended follow-up evaluation in 6 months.   Troy Sine, MD, [facility]  03/03/2020 10:28 AM

## 2020-03-03 NOTE — Patient Instructions (Signed)
Medication Instructions:  CONTINUE WITH CURRENT MEDICATIONS. NO CHANGES.  *If you need a refill on your cardiac medications before your next appointment, please call your pharmacy*  Follow-Up: At [facility], you and your health needs are our priority.  As part of our continuing mission to provide you with exceptional heart care, we have created designated Provider Care Teams.  These Care Teams include your primary Cardiologist (physician) and Advanced Practice Providers (APPs -  Physician Assistants and Nurse Practitioners) who all work together to provide you with the care you need, when you need it.  We recommend signing up for the patient portal called "MyChart".  Sign up information is provided on this After Visit Summary.  MyChart is used to connect with patients for Virtual Visits (Telemedicine).  Patients are able to view lab/test results, encounter notes, upcoming appointments, etc.  Non-urgent messages can be sent to your provider as well.   To learn more about what you can do with MyChart, go to [email or web address].    Your next appointment:   6 month(s)  The format for your next appointment:   In Person  Provider:   Shelva Majestic, MD   Other Instructions: [email or web address]    --- order a Resmed AirFit F30i full face CPAP mask with headgear

## 2020-03-05 ENCOUNTER — Encounter: Payer: Self-pay | Admitting: Cardiovascular Disease

## 2020-03-06 ENCOUNTER — Telehealth: Payer: Self-pay | Admitting: Cardiovascular Disease

## 2020-03-06 NOTE — Telephone Encounter (Signed)
Responded to patient via Mychart with mask information  Per last ov note:  [email or web address]    --- order a Resmed AirFit F30i full face CPAP mask with headgear

## 2020-03-06 NOTE — Telephone Encounter (Signed)
Pt was told by Dr. Claiborne Billings to order a new mask for his CPAP machine but can not recall what the specifics were. Please call or contact him via MyChart with the specifics for the new mask

## 2020-03-07 NOTE — Telephone Encounter (Signed)
The size depends on his nose and mouth size. Would try medium

## 2020-08-20 ENCOUNTER — Other Ambulatory Visit: Payer: Self-pay | Admitting: Cardiology

## 2020-08-20 NOTE — Telephone Encounter (Signed)
Prescription refill request for Xarelto received.  Indication: atrial fibrillation Last office visit: 10/2019  Glen Bolton Weight:118.3 kg Age: 79 Scr: 0.86 12/2019 CrCl: 120.36 ml/min  Prescription refilled

## 2020-09-29 ENCOUNTER — Telehealth: Payer: Self-pay | Admitting: Cardiology

## 2020-09-29 NOTE — Telephone Encounter (Signed)
*  STAT* If patient is at the pharmacy, call can be transferred to refill team.   1. Which medications need to be refilled? (please list name of each medication and dose if known) XARELTO 20 MG TABS tablet  2. Which pharmacy/location (including street and city if local pharmacy) is medication to be sent to? [place],  - [address] AT [place]  3. Do they need a 30 day or 90 day supply? 30 day

## 2020-09-30 ENCOUNTER — Other Ambulatory Visit: Payer: Self-pay

## 2020-09-30 ENCOUNTER — Telehealth: Payer: Self-pay | Admitting: Cardiology

## 2020-09-30 MED ORDER — RIVAROXABAN 20 MG PO TABS: ORAL_TABLET | ORAL | 3 refills | Status: DC

## 2020-09-30 NOTE — Telephone Encounter (Signed)
*  STAT* If patient is at the pharmacy, call can be transferred to refill team.   1. Which medications need to be refilled? (please list name of each medication and dose if known)  XARELTO 20 MG TABS tablet   2. Which pharmacy/location (including street and city if local pharmacy) is medication to be sent to?   [place], [place] - [address] AT [place]   3. Do they need a 30 day or 90 day supply? 30   Patient states he's been out of this medication for two days. Please advise.

## 2020-09-30 NOTE — Telephone Encounter (Signed)
Rx has been sent to the pharmacy electronically. ° °

## 2020-09-30 NOTE — Telephone Encounter (Signed)
Prescription refill request for Xarelto received.  Indication:atrial fibrillation Last office visit:2/21   [facility] Weight:118.3 kg Age: 80 Scr:0.86  4/21 CrCl:118.45 ml/min  Prescription refilled

## 2020-10-03 DIAGNOSIS — I4891 Unspecified atrial fibrillation: Secondary | ICD-10-CM | POA: Diagnosis not present

## 2020-10-03 DIAGNOSIS — D6869 Other thrombophilia: Secondary | ICD-10-CM | POA: Diagnosis not present

## 2020-10-03 DIAGNOSIS — I10 Essential (primary) hypertension: Secondary | ICD-10-CM | POA: Diagnosis not present

## 2020-10-03 DIAGNOSIS — Z79899 Other long term (current) drug therapy: Secondary | ICD-10-CM | POA: Diagnosis not present

## 2020-10-03 DIAGNOSIS — E1169 Type 2 diabetes mellitus with other specified complication: Secondary | ICD-10-CM | POA: Diagnosis not present

## 2020-10-03 DIAGNOSIS — E7800 Pure hypercholesterolemia, unspecified: Secondary | ICD-10-CM | POA: Diagnosis not present

## 2020-11-02 NOTE — Progress Notes (Signed)
Cardiology Office Note   Date:  11/03/2020   ID:  Glen Bolton, DOB [birth year], MRN [id number]  PCP:  Lujean Amel, MD  Cardiologist:   Minus Breeding, MD   Chief Complaint  Patient presents with  . Shortness of Breath      History of Present Illness: Glen Bolton is a 80 y.o. male who presents for evaluation of atrial fibrillation.  Since I last saw him he has been he has had no acute cardiac complaints.  He has a wearable and his heart rates in the 60s and 70s.  He does not notice his atrial fibrillation.  He was working at a golf course.  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  He golfs about once a week.  He does get dyspneic with exertion but this is slowly progressive.  He is not describing any PND or orthopnea.  He has gained weight and again has decreased his activity.  He is not describing chest pressure, neck or arm discomfort.  He is not describing PND or orthopnea.  He uses CPAP but it only lasts for about an hour before it stops working.  I did go back and look at his sleep notes and is probably overdue for follow-up.  Of note he has some pulmonary hypertension on echo last year.  He is being followed by hematology for MGUS but there has been no suggestion that he has any amyloid involvement in his heart.  His EF is well-preserved.     Past Medical History:  Diagnosis Date  . Anxiety   . Arthritis   . Atrial fibrillation, chronic 08/26/2015  . Borderline diabetic   . Chronic anticoagulation 12/04/2018   CHADS VASC-3-Xarelto  . GERD (gastroesophageal reflux disease)   . Glaucoma   . Hard of hearing   . History of gastritis   . History of kidney stones   . History of stomach ulcers   . Hx of migraines    no migraines since he was in his 48's  . Hypertension   . Osteoarthritis of right knee 12/13/2019  . Peripheral neuropathy 12/26/2017  . Primary osteoarthritis of left hip 10/25/2015  . Sleep apnea    CPAP  not currently working  couldnt breathe  with it laying down trying something different end of  April or May 2021    Past Surgical History:  Procedure Laterality Date  . CATARACT EXTRACTION Bilateral 2014  . COLONOSCOPY    . ESOPHAGOGASTRODUODENOSCOPY    . EYE SURGERY    . INGUINAL HERNIA REPAIR Left    x 2  . Rotator cuff surgery Right 2013  . TOTAL HIP ARTHROPLASTY Left 10/27/2015   Procedure: TOTAL HIP ARTHROPLASTY ANTERIOR APPROACH;  Surgeon: Frederik Pear, MD;  Location: [facility];  Service: Orthopedics;  Laterality: Left;  . TOTAL KNEE ARTHROPLASTY Right 12/17/2019   Procedure: RIGHT TOTAL KNEE ARTHROPLASTY;  Surgeon: Frederik Pear, MD;  Location: [facility];  Service: Orthopedics;  Laterality: Right;     Current Outpatient Medications  Medication Sig Dispense Refill  . dorzolamide-timolol (COSOPT) 22.3-6.8 MG/ML ophthalmic solution Place 1 drop into both eyes 2 (two) times daily.   8  . hydrochlorothiazide (HYDRODIURIL) 12.5 MG tablet Take 12.5 mg by mouth every evening.     . metoprolol succinate (TOPROL-XL) 50 MG 24 hr tablet Take 1 tablet (50 mg total) by mouth daily. 90 tablet 2  . Multiple Vitamin (MULTIVITAMIN WITH MINERALS) TABS tablet Take 1 tablet by mouth daily.    Marland Kitchen  olmesartan (BENICAR) 40 MG tablet Take 40 mg by mouth daily.   4  . Probiotic Product (PROBIOTIC DAILY PO) Take 1 capsule by mouth every evening.    . rivaroxaban (XARELTO) 20 MG TABS tablet TAKE 1 TABLET BY MOUTH DAILY WITH SUPPER 30 tablet 3   No current facility-administered medications for this visit.    Allergies:   Patient has no known allergies.    ROS:  Please see the history of present illness.   Otherwise, review of systems are positive for none.   All other systems are reviewed and negative.    PHYSICAL EXAM: VS:  BP 132/82   Pulse 68   Ht 5\' 10"  (1.778 m)   Wt 273 lb (123.8 kg)   SpO2 97%   BMI 39.17 kg/m  , BMI Body mass index is 39.17 kg/m. GENERAL:  Well appearing NECK:  No jugular venous distention, waveform within normal  limits, carotid upstroke brisk and symmetric, no bruits, no thyromegaly LUNGS:  Clear to auscultation bilaterally CHEST:  Unremarkable HEART:  PMI not displaced or sustained,S1 and S2 within normal limits, no S3, no clicks, no rubs, no murmurs, irregular  ABD:  Flat, positive bowel sounds normal in frequency in pitch, no bruits, no rebound, no guarding, no midline pulsatile mass, no hepatomegaly, no splenomegaly EXT:  2 plus pulses throughout, no edema, no cyanosis no clubbing  EKG:  EKG is ordered today. The ekg ordered today demonstrates atrial fibrillation, rate 68, axis within normal limits, intervals within normal limits, no acute ST-T wave changes.   Recent Labs: 12/18/2019: BUN 18; Creatinine, Ser 0.86; Hemoglobin 14.3; Platelets 154; Potassium 4.3; Sodium 136    Lipid Panel No results found for: CHOL, TRIG, HDL, CHOLHDL, VLDL, LDLCALC, LDLDIRECT    Wt Readings from Last 3 Encounters:  11/03/20 273 lb (123.8 kg)  03/03/20 260 lb 12.8 oz (118.3 kg)  12/17/19 275 lb 5.7 oz (124.9 kg)      Other studies Reviewed: Additional studies/ records that were reviewed today include: Heme Onc, Sleep notes, labs. Review of the above records demonstrates:  Please see elsewhere in the note.     ASSESSMENT AND PLAN:  CAF- He tolerates anticoagulation.  His rate is well controlled.  No change in therapy.  Glen Bolton has a CHA2DS2 - VASc score of 3.    Essential HTN At target.  No change in therapy.   Sleep apnea He uses CPAP.  However, it does not sound like this is well treated.  I sent a message to our sleep nurse to get him back in the program.  Management of this I think is important for management of his elevated pulmonary pressures and what he is generalized fatigue.  Dyspnea: I do not expect that this is an ischemic equivalent.  He is obese.  He is sedentary.  I will check a BNP level but really would concentrate on weight loss and increased activity and if he gets worse I  might do further work-up.  Moribd obesity: We had a long discussion about this and I think this is a life limiting problem if he does not start to act on it.  I gave him specific instructions on his diet.  Current medicines are reviewed at length with the patient today.  The patient does not have concerns regarding medicines.  The following changes have been made:  no change  Labs/ tests ordered today include:   Orders Placed This Encounter  Procedures  . Brain natriuretic  peptide  . EKG 12-Lead     Disposition:   FU with me in one year.     Signed, Minus Breeding, MD  11/03/2020 10:47 AM    [facility]

## 2020-11-03 ENCOUNTER — Telehealth: Payer: Self-pay | Admitting: *Deleted

## 2020-11-03 ENCOUNTER — Other Ambulatory Visit: Payer: Self-pay

## 2020-11-03 ENCOUNTER — Ambulatory Visit: Payer: Medicare Other | Admitting: Cardiology

## 2020-11-03 ENCOUNTER — Encounter: Payer: Self-pay | Admitting: Cardiology

## 2020-11-03 VITALS — BP 132/82 | HR 68 | Ht 70.0 in | Wt 273.0 lb

## 2020-11-03 DIAGNOSIS — R0602 Shortness of breath: Secondary | ICD-10-CM | POA: Diagnosis not present

## 2020-11-03 DIAGNOSIS — I10 Essential (primary) hypertension: Secondary | ICD-10-CM | POA: Diagnosis not present

## 2020-11-03 DIAGNOSIS — I4821 Permanent atrial fibrillation: Secondary | ICD-10-CM

## 2020-11-03 NOTE — Progress Notes (Signed)
2 weeks worth of Xarelto 20mg  samples given.  Lot: [id number] Exp: 04/24

## 2020-11-03 NOTE — Patient Instructions (Signed)
Medication Instructions:  Xarelto samples given No changes *If you need a refill on your cardiac medications before your next appointment, please call your pharmacy*  Lab Work: Your physician recommends that you return for lab work today: BNP If you have labs (blood work) drawn today and your tests are completely normal, you will receive your results only by: Marland Kitchen MyChart Message (if you have MyChart) OR . A paper copy in the mail If you have any lab test that is abnormal or we need to change your treatment, we will call you to review the results.  Follow-Up: At [facility], you and your health needs are our priority.  As part of our continuing mission to provide you with exceptional heart care, we have created designated Provider Care Teams.  These Care Teams include your primary Cardiologist (physician) and Advanced Practice Providers (APPs -  Physician Assistants and Nurse Practitioners) who all work together to provide you with the care you need, when you need it.  Your next appointment:   12 month(s)  The format for your next appointment:   In Person  Provider:   Minus Breeding, MD

## 2020-11-03 NOTE — Telephone Encounter (Signed)
Patient was seen in the office today by Dr Percival Spanish and mentioned he was having some masks issues. It has also been greater than 1 year since his last appointment with Dr Claiborne Billings. Upon looking him up in the [place] system it was noticed that he has not used his CPAP machine since the fall at least. Message sent to Imagene Gurney to have patient to come into the office to be seen by Dr Claiborne Billings this Friday, March 4th.

## 2020-11-04 LAB — BRAIN NATRIURETIC PEPTIDE: BNP: 135 pg/mL — ABNORMAL HIGH (ref 0.0–100.0)

## 2020-11-06 ENCOUNTER — Other Ambulatory Visit: Payer: Self-pay

## 2020-11-06 ENCOUNTER — Encounter: Payer: Self-pay | Admitting: Cardiovascular Disease

## 2020-11-06 ENCOUNTER — Ambulatory Visit: Payer: Medicare Other | Admitting: Cardiovascular Disease

## 2020-11-06 VITALS — BP 121/77 | HR 67 | Ht 70.0 in | Wt 272.6 lb

## 2020-11-06 DIAGNOSIS — I10 Essential (primary) hypertension: Secondary | ICD-10-CM | POA: Diagnosis not present

## 2020-11-06 DIAGNOSIS — Z7901 Long term (current) use of anticoagulants: Secondary | ICD-10-CM | POA: Diagnosis not present

## 2020-11-06 DIAGNOSIS — G4733 Obstructive sleep apnea (adult) (pediatric): Secondary | ICD-10-CM

## 2020-11-06 DIAGNOSIS — I4821 Permanent atrial fibrillation: Secondary | ICD-10-CM

## 2020-11-06 NOTE — Patient Instructions (Signed)
Medication Instructions:  The current medical regimen is effective;  continue present plan and medications.  *If you need a refill on your cardiac medications before your next appointment, please call your pharmacy*   Follow-Up: At [facility], you and your health needs are our priority.  As part of our continuing mission to provide you with exceptional heart care, we have created designated Provider Care Teams.  These Care Teams include your primary Cardiologist (physician) and Advanced Practice Providers (APPs -  Physician Assistants and Nurse Practitioners) who all work together to provide you with the care you need, when you need it.  We recommend signing up for the patient portal called "MyChart".  Sign up information is provided on this After Visit Summary.  MyChart is used to connect with patients for Virtual Visits (Telemedicine).  Patients are able to view lab/test results, encounter notes, upcoming appointments, etc.  Non-urgent messages can be sent to your provider as well.   To learn more about what you can do with MyChart, go to [email or web address].    Your next appointment:   3 month(s)  The format for your next appointment:   In Person  Provider:   Shelva Majestic, MD   Other Instructions We will change pressures on machine, and download in a month- will call with any other changes at that time.

## 2020-11-06 NOTE — Progress Notes (Signed)
ID:  Kallon Caylor, DOB 1940-09-10, MRN [id number]  PCP:  Lujean Amel, MD                    Cardiologist:   Minus Breeding, MD   HPI: Rigley Niess is a 80 y.o. male who presents for 9 month follow-up sleep evaluation  Mr. Mcisaac followed by Dr. Percival Spanish for cardiology care and has a history of atrial fibrillation, hypertension, and GERD.  Due to concerns for obstructive sleep apnea.  He was referred for diagnostic polysomnogram which was done on 11/20/2015.  This confirmed moderate obstructive sleep apnea with an overall AHI of 18.2 per hour.  Events were worse with supine sleep with an HI of 21.6 per hour.  He had significant oxygen desaturation to 79%.  There was reduced sleep efficiency of only 64.4%.  He had loud snoring.  He was in atrial fibrillation for the duration of the study and there were occasional PVCs.  He ultimately underwent a complete CPAP titration study on 01/12/2016 and CPAP was titrated up to 13 cm water pressure.  He has been on CPAP therapy since June.  His DME company [facility].  A download from June 29 through 04/02/2016 revealed 83% of days with usage and 70% of days with usage greater than 4 hours.  He was just meeting Medicare compliance standards.  His average usage per night was 5 hours and 26 minutes.  At this at pressure of 13 cm, AHI was excellent at 0.7 despite a significant mask leak at 45.1 L/m.  He has been using nasal pillows.  Typically goes to bed at 10 PM, but wakes up between 5 and 6 AM.  He has had difficulty with dry mouth as well as intermittent nasal congestion.  He feels that he has more energy since initiating CPAP therapy.  He is unaware of any breakthrough snoring. An Epworth scale following CPAP initiation in 2017  endorsed at 4 and shown arguing against residual daytime sleepiness.  I  saw Mr. Gossard in September 2018 and over the previous year he continued to use CPAP therapy.However, for the last several months he has had problems with  nasal congestion as well as postnasal drip.  When his nose gets congested he has not been using CPAP and average use is approximately 4 days per week.  He feels improved since he has been using CPAP.  His prior 3 times per night nocturia is now down to 0-1 time per night.  His sleep is restorative.  He is unaware of breakthrough snoring.  He denies residual daytime sleepiness. He typically goes to bed between 9 and 10 PM and wakes up at 6 AM.  An Epworth Sleepiness Scale score was recalculated in the office today and this endorsed at 8, again arguing against excessive daytime sleepiness.  He was evaluated by Fabian Sharp in February 2021 at that time stated that he was not typically compliant with CPAP due to his postnasal drip and nasal mask.  As result, he has not been using CPAP.   I last saw him in June 2021 and a download did not show any recent use.   An old download however did show at 13 cm water pressure his AHI was excellent at 0.7.  He cannot sleep on his back.  He has undergone knee replacement surgery in March 20021 and had some pain while sleeping.  He has been on hydrochlorothiazide 12.5 mg, metoprolol succinate 50 mg, and  olmesartan 40 mg daily for hypertension.  He is anticoagulated on Xarelto.  He is followed in hematology clinic with elevated serum kappa light chains.  During his evaluation, I suggested he change his mask to a ResMed air fit F 30i mask.  I also changed his CPAP set pressure to an auto mode and in particular if he were to sleep on his back he would require higher pressure.  He was recently seen by Dr. Percival Spanish and during his evaluation the patient stated that due to mask issues he had not been using the CPAP.  As result Barry Brunner was contacted and he is added onto my schedule today for evaluation.  An Epworth Sleepiness Scale score was recalculated in the office today and this endorsed at 10 as shown below:  Epworth Sleepiness Scale: Situation   Chance of  Dozing/Sleeping (0 = never , 1 = slight chance , 2 = moderate chance , 3 = high chance )   sitting and reading 2   watching TV 2   sitting inactive in a public place 1   being a passenger in a motor vehicle for an hour or more 0   lying down in the afternoon 3   sitting and talking to someone 0   sitting quietly after lunch (no alcohol) 2   while stopped for a few minutes in traffic as the driver 0   Total Score  10   He admits to snoring, nonrestorative sleep, nocturia 2 times per night.  Past Medical History:  Diagnosis Date  . Anxiety   . Arthritis   . Atrial fibrillation, chronic 08/26/2015  . Borderline diabetic   . Chronic anticoagulation 12/04/2018   CHADS VASC-3-Xarelto  . GERD (gastroesophageal reflux disease)   . Glaucoma   . Hard of hearing   . History of gastritis   . History of kidney stones   . History of stomach ulcers   . Hx of migraines    no migraines since he was in his 36's  . Hypertension   . Osteoarthritis of right knee 12/13/2019  . Peripheral neuropathy 12/26/2017  . Primary osteoarthritis of left hip 10/25/2015  . Sleep apnea    CPAP  not currently working  couldnt breathe with it laying down trying something different end of  April or May 2021    Past Surgical History:  Procedure Laterality Date  . CATARACT EXTRACTION Bilateral 2014  . COLONOSCOPY    . ESOPHAGOGASTRODUODENOSCOPY    . EYE SURGERY    . INGUINAL HERNIA REPAIR Left    x 2  . Rotator cuff surgery Right 2013  . TOTAL HIP ARTHROPLASTY Left 10/27/2015   Procedure: TOTAL HIP ARTHROPLASTY ANTERIOR APPROACH;  Surgeon: Frederik Pear, MD;  Location: [facility];  Service: Orthopedics;  Laterality: Left;  . TOTAL KNEE ARTHROPLASTY Right 12/17/2019   Procedure: RIGHT TOTAL KNEE ARTHROPLASTY;  Surgeon: Frederik Pear, MD;  Location: [facility];  Service: Orthopedics;  Laterality: Right;    No Known Allergies  Current Outpatient Medications  Medication Sig Dispense Refill  . dorzolamide-timolol (COSOPT)  22.3-6.8 MG/ML ophthalmic solution Place 1 drop into both eyes 2 (two) times daily.   8  . hydrochlorothiazide (HYDRODIURIL) 12.5 MG tablet Take 12.5 mg by mouth every evening.     . metoprolol succinate (TOPROL-XL) 50 MG 24 hr tablet Take 1 tablet (50 mg total) by mouth daily. 90 tablet 2  . Multiple Vitamin (MULTIVITAMIN WITH MINERALS) TABS tablet Take 1 tablet by mouth daily.    Marland Kitchen  olmesartan (BENICAR) 40 MG tablet Take 40 mg by mouth daily.   4  . Probiotic Product (PROBIOTIC DAILY PO) Take 1 capsule by mouth every evening.    . rivaroxaban (XARELTO) 20 MG TABS tablet TAKE 1 TABLET BY MOUTH DAILY WITH SUPPER 30 tablet 3  . dorzolamide (TRUSOPT) 2 % ophthalmic solution 1 drop into affected eye     No current facility-administered medications for this visit.    Social History   Socioeconomic History  . Marital status: Married    Spouse name: Not on file  . Number of children: 2  . Years of education: Not on file  . Highest education level: Not on file  Occupational History  . Occupation: Golf course  Tobacco Use  . Smoking status: Never Smoker  . Smokeless tobacco: Never Used  Vaping Use  . Vaping Use: Never used  Substance and Sexual Activity  . Alcohol use: Yes    Alcohol/week: 1.0 standard drink    Types: 1 Glasses of wine per week    Comment: daily  . Drug use: No  . Sexual activity: Not Currently  Other Topics Concern  . Not on file  Social History Narrative   Lives with wife   Caffeine use: 2 cups coffee daily   Gun sm[employer]   Right handed    Social Determinants of Health   Financial Resource Strain: Not on file  Food Insecurity: Not on file  Transportation Needs: Not on file  Physical Activity: Not on file  Stress: Not on file  Social Connections: Not on file  Intimate Partner Violence: Not on file   Additional social history is notable that is that he works at [employer] course.He is now working 5 days per week at this golf course and 1 day at  pleasant [place] course.  Family History  Problem Relation Age of Onset  . Hyperlipidemia Brother   . Stroke Sister 82     ROS General: Negative; No fevers, chills, or night sweats HEENT: Negative; No changes in vision or hearing, sinus congestion, difficulty swallowing Pulmonary: Negative; No cough, wheezing, shortness of breath, hemoptysis Cardiovascular: Permanent atrial fibrillation GI: Negative; No nausea, vomiting, diarrhea, or abdominal pain GU: Negative; No dysuria, hematuria, or difficulty voiding Musculoskeletal: Negative; no myalgias, joint pain, or weakness Hematologic: Elevated serum kappa light chains Endocrine: Negative; no heat/cold intolerance Neuro: Negative; no changes in balance, headaches Skin: Negative; No rashes or skin lesions Psychiatric: Negative; No behavioral problems, depression Sleep: see above;    Physical Exam BP 121/77   Pulse 67   Ht _0  (1.778 m)   Wt 272 lb 9.6 oz (123.7 kg)   SpO2 95%   BMI 39.11 kg/m    Repeat blood pressure by me 124/78  Wt Readings from Last 3 Encounters:  11/06/20 272 lb 9.6 oz (123.7 kg)  11/03/20 273 lb (123.8 kg)  03/03/20 260 lb 12.8 oz (118.3 kg)   General: Alert, oriented, no distress.  Skin: normal turgor, no rashes, warm and dry HEENT: Normocephalic, atraumatic. Pupils equal round and reactive to light; sclera anicteric; extraocular muscles intact;  Nose without nasal septal hypertrophy Mouth/Parynx benign; Mallinpatti scale 3/4 Neck: No JVD, no carotid bruits; normal carotid upstroke Lungs: clear to ausculatation and percussion; no wheezing or rales Chest wall: without tenderness to palpitation Heart: PMI not displaced, RRR, s1 s2 normal, 1/6 systolic murmur, no diastolic murmur, no rubs, gallops, thrills, or heaves Abdomen: soft, nontender; no hepatosplenomehaly, BS+; abdominal aorta nontender  and not dilated by palpation. Back: no CVA tenderness Pulses 2+ Musculoskeletal: full range of  motion, normal strength, no joint deformities Extremities: no clubbing cyanosis or edema, Homan's sign negative  Neurologic: grossly nonfocal; Cranial nerves grossly wnl Psychologic: Normal mood and affect   ECG (independently read by me): Atrial fibrillation at 78 QSV1-18 May 2017 ECG (independently read by me): Atrial fibrillation at 71 bpm.  PVC.  ECG from 08/26/2015 was reviewed which showed atrial fibrillation at 93 bpm.  LABS:  BMP Latest Ref Rng & Units 12/18/2019 12/12/2019 06/15/2019  Glucose 70 - 99 mg/dL 265(H) 114(H) 117(H)  BUN 8 - 23 mg/dL _0 Creatinine 0.61 - 1.24 mg/dL 0.86 0.80 0.82  Sodium 135 - 145 mmol/L 136 140 143  Potassium 3.5 - 5.1 mmol/L 4.3 4.2 4.0  Chloride 98 - 111 mmol/L 102 105 107  CO2 22 - 32 mmol/L _1 Calcium 8.9 - 10.3 mg/dL 8.6(L) 9.5 8.8(L)     Hepatic Function Latest Ref Rng & Units 06/15/2019 12/08/2018 06/09/2018  Total Protein 6.5 - 8.1 g/dL 6.7 6.8 7.0  Albumin 3.5 - 5.0 g/dL 4.0 3.9 4.0  AST 15 - 41 U/L 28 36 34  ALT 0 - 44 U/L 29 37 34  Alk Phosphatase 38 - 126 U/L 62 76 80  Total Bilirubin 0.3 - 1.2 mg/dL 0.9 0.9 0.9     CBC Latest Ref Rng & Units 12/18/2019 12/12/2019 06/15/2019  WBC 4.0 - 10.5 K/uL 9.5 6.8 5.0  Hemoglobin 13.0 - 17.0 g/dL 14.3 15.5 15.4  Hematocrit 39.0 - 52.0 % 43.6 48.2 45.9  Platelets 150 - 400 K/uL 154 161 148(L)     Lipid Panel  No results found for: CHOL, TRIG, HDL, CHOLHDL, VLDL, LDLCALC, LDLDIRECT   RADIOLOGY: No results found.  IMPRESSION:  1. OSA (obstructive sleep apnea)   2. Permanent atrial fibrillation ([place])   3. Essential hypertension   4. Chronic anticoagulation     ASSESSMENT AND PLAN: Mr. Robert Sperl is a 80 year old male who has a history of hypertension and permanent atrial fibrillation.  He is on Xarelto for chronic anticoagulation.  He was found to have moderate obstructive sleep apnea on his sleep study from Spring 2017, which was more pronounced with supine  posture.  He had reduced sleep efficiency, and had abnormal sleep architecture with absence of slow-wave sleep and prolonged latency to REM sleep.  He had significant oxygen desaturation to a nadir of 79% and there was evidence for loud snoring.  When CPAP was initiated, he did note significant improvement and initial download had shown excellent response with an AHI of 0.7 at 13 cm of water.  When I last saw him he was having difficulty using his mask due to significant nasal congestion.  He also cannot sleep on his back which most likely is due to severe sleep apnea.  At that time I suggested switching his mask to a ResMed air fit F 30 little eye mask which will allow him to breathe through his mouth if his nose is significantly congested.  Apparently stopped using CPAP over a year and a half ago.  He states that the mask has been difficult and at times was blowing too hard causing leak.  However he essentially has not used treatment in a year and a half.  Unfortunately, his machine is not an auto CPAP unit and has been set at a set pressure of 13.  I will decrease his  pressure to 10 cm.  I will also increase his ramp start pressure from 4 up to 6 cm of water and continue with an auto ramp capability.  I again had a long discussion with him regarding importance of CPAP use particularly with his cardiovascular health.  He has a history of pulmonary hypertension as well as atrial fibrillation which undoubtedly may have been contributed by his sleep apnea.  We will obtain a download in 1 month following hopeful reinitiation of therapy.  His blood pressure is stable and he continues to be on HCTZ 12.5 mg, Toprol-XL 50 mg, and olmesartan 40 mg daily.  He is anticoagulated on Xarelto 20 mg.  I will see him in 3 months for reevaluation.  Troy Sine, MD, [facility]  11/08/2020 1:30 PM

## 2020-11-07 ENCOUNTER — Ambulatory Visit: Payer: Medicare Other | Admitting: Cardiovascular Disease

## 2020-11-08 ENCOUNTER — Encounter: Payer: Self-pay | Admitting: Cardiovascular Disease

## 2020-11-17 ENCOUNTER — Other Ambulatory Visit: Payer: Self-pay | Admitting: Cardiology

## 2020-12-12 ENCOUNTER — Inpatient Hospital Stay: Payer: Medicare Other | Attending: Hematology

## 2020-12-12 ENCOUNTER — Other Ambulatory Visit: Payer: Self-pay

## 2020-12-12 ENCOUNTER — Telehealth: Payer: Self-pay | Admitting: Hematology

## 2020-12-12 DIAGNOSIS — D472 Monoclonal gammopathy: Secondary | ICD-10-CM | POA: Insufficient documentation

## 2020-12-12 LAB — CMP (CANCER CENTER ONLY)
ALT: 31 U/L (ref 0–44)
AST: 29 U/L (ref 15–41)
Albumin: 3.9 g/dL (ref 3.5–5.0)
Alkaline Phosphatase: 68 U/L (ref 38–126)
Anion gap: 12 (ref 5–15)
BUN: 21 mg/dL (ref 8–23)
CO2: 23 mmol/L (ref 22–32)
Calcium: 8.8 mg/dL — ABNORMAL LOW (ref 8.9–10.3)
Chloride: 106 mmol/L (ref 98–111)
Creatinine: 0.94 mg/dL (ref 0.61–1.24)
GFR, Estimated: >60 mL/min (ref >60)
Glucose, Bld: 105 mg/dL — ABNORMAL HIGH (ref 70–99)
Potassium: 4.1 mmol/L (ref 3.5–5.1)
Sodium: 141 mmol/L (ref 135–145)
Total Bilirubin: 0.8 mg/dL (ref 0.3–1.2)
Total Protein: 6.9 g/dL (ref 6.5–8.1)

## 2020-12-12 LAB — CBC WITH DIFFERENTIAL/PLATELET
Abs Immature Granulocytes: 0.01 10*3/uL (ref 0.00–0.07)
Basophils Absolute: 0 10*3/uL (ref 0.0–0.1)
Basophils Relative: 0 %
Eosinophils Absolute: 0.2 10*3/uL (ref 0.0–0.5)
Eosinophils Relative: 3 %
HCT: 46.2 % (ref 39.0–52.0)
Hemoglobin: 15.7 g/dL (ref 13.0–17.0)
Immature Granulocytes: 0 %
Lymphocytes Relative: 21 %
Lymphs Abs: 1.4 10*3/uL (ref 0.7–4.0)
MCH: 31.3 pg (ref 26.0–34.0)
MCHC: 34 g/dL (ref 30.0–36.0)
MCV: 92 fL (ref 80.0–100.0)
Monocytes Absolute: 1 10*3/uL (ref 0.1–1.0)
Monocytes Relative: 14 %
Neutro Abs: 4.1 10*3/uL (ref 1.7–7.7)
Neutrophils Relative %: 62 %
Platelets: 154 10*3/uL (ref 150–400)
RBC: 5.02 MIL/uL (ref 4.22–5.81)
RDW: 13.3 % (ref 11.5–15.5)
WBC: 6.8 10*3/uL (ref 4.0–10.5)
nRBC: 0 % (ref 0.0–0.2)

## 2020-12-12 NOTE — Telephone Encounter (Signed)
Left message with rescheduled upcoming appointment due to provider's PAL. Gave option to call back to reschedule if needed. 

## 2020-12-15 LAB — MULTIPLE MYELOMA PANEL, SERUM
Albumin SerPl Elph-Mcnc: 3.7 g/dL (ref 2.9–4.4)
Albumin/Glob SerPl: 1.4 (ref 0.7–1.7)
Alpha 1: 0.3 g/dL (ref 0.0–0.4)
Alpha2 Glob SerPl Elph-Mcnc: 0.8 g/dL (ref 0.4–1.0)
B-Globulin SerPl Elph-Mcnc: 0.9 g/dL (ref 0.7–1.3)
Gamma Glob SerPl Elph-Mcnc: 1 g/dL (ref 0.4–1.8)
Globulin, Total: 2.8 g/dL (ref 2.2–3.9)
IgA: 141 mg/dL (ref 61–437)
IgG (Immunoglobin G), Serum: 859 mg/dL (ref 603–1613)
IgM (Immunoglobulin M), Srm: 237 mg/dL — ABNORMAL HIGH (ref 15–143)
Total Protein ELP: 6.5 g/dL (ref 6.0–8.5)

## 2020-12-15 LAB — KAPPA/LAMBDA LIGHT CHAINS
Kappa free light chain: 215.2 mg/L — ABNORMAL HIGH (ref 3.3–19.4)
Kappa, lambda light chain ratio: 13.37 — ABNORMAL HIGH (ref 0.26–1.65)
Lambda free light chains: 16.1 mg/L (ref 5.7–26.3)

## 2020-12-19 ENCOUNTER — Ambulatory Visit: Payer: Medicare Other | Admitting: Hematology

## 2020-12-29 DIAGNOSIS — J209 Acute bronchitis, unspecified: Secondary | ICD-10-CM | POA: Diagnosis not present

## 2020-12-30 DIAGNOSIS — R059 Cough, unspecified: Secondary | ICD-10-CM | POA: Diagnosis not present

## 2020-12-30 DIAGNOSIS — Z03818 Encounter for observation for suspected exposure to other biological agents ruled out: Secondary | ICD-10-CM | POA: Diagnosis not present

## 2020-12-30 DIAGNOSIS — J029 Acute pharyngitis, unspecified: Secondary | ICD-10-CM | POA: Diagnosis not present

## 2020-12-30 DIAGNOSIS — J209 Acute bronchitis, unspecified: Secondary | ICD-10-CM | POA: Diagnosis not present

## 2021-01-03 NOTE — Progress Notes (Incomplete)
HEMATOLOGY/ONCOLOGY CLINIC NOTE  Date of Service: 01/03/2021  Patient Care Team: Lujean Amel, MD as PCP - General (Family Medicine) Minus Breeding, MD as PCP - Cardiology (Cardiology)  CHIEF COMPLAINTS/PURPOSE OF CONSULTATION:  Elevated serum kappa light chains  HISTORY OF PRESENTING ILLNESS:   Glen Bolton is a wonderful 80 y.o. male who has been referred to [place] by my colleague Dr Grace Isaac for evaluation and management of Elevated serum kappa light chains. He is accompanied today by his wife. The pt reports that he is doing well overall.   The pt reports that he had no symptoms before his abnormal lab results prompting his initial visit with Dr Lebron Conners. He denies any concerns for inflammation besides an ear infection for the last 3 weeks and a viral infection as well. He is being followed by his PCP and has had a 20dB drop in his right ear threshold. He has had upper respiratory symptoms including a bad cough that hurt his abdomen which is coupled by a bruise at his lower abdomen. He denies any trauma to the area but does take Xarelto for his Afib. He has been treated with a z-pack and other antibiotics. He has had tubes put in his ear 3 times in the past but denies any ear pain.   He notes that he has arthritis in his knees but denies concern for rheumatoid arthritis.   Of note prior to the patient's visit today, pt has had PET/CT completed on 02/06/18 with results revealing 1. No hypermetabolic mass or adenopathy identified. 2. No evidence for hepatomegaly or splenomegaly. 3. Mild low level FDG uptake throughout the bone marrow is noted. Nonspecific and may be physiologic. 4.  Aortic Atherosclerosis (ICD10-I70.0). 5. Bilateral maxillary sinus disease.   Surgical pathology 02/03/18 revealed HYPERCELLULAR BONE MARROW FOR AGE WITH TRILINEAGE HEMATOPOIESIS. - A MINOR PLASMA CELL COMPONENT WITH KAPPA LIGHT CHAIN EXCESS. - A FEW SMALL LYMPHOID AGGREGATES PRESENT.   Most recent lab  results (02/03/18) of CBC w/ diff  is as follows: all values are WNL. UPEP Light chains 01/26/18 showed Total Protein ur/day at 156, Free kappa lt chains, ur at 93.20, Free lambda lt chains at 7.61, Free Kappa/Lambda ratio at 12.25.  MMP 01/24/18 showed all values WNL except for IgM at 244. M Protein not observed.   On review of systems, pt reports cough, ear infection, lower abdomen superficial bruising, and denies fevers, chills, night sweats, leg swelling, testicular pain or swelling, and any other symptoms.   Interval History:   Glen Bolton returns today for management and evaluation of his elevated serum free light chains. The patient's last visit with [place] was on 12/19/2019. The pt reports that he is doing well overall.  The pt reports ***  Lab results 12/12/2020 of CBC w/diff and CMP is as follows: all values are WNL except for Glucose of 105, Calcium of 8.8. 12/12/2020 MMP WNL except IgM of 237. 12/12/2020 Kappa free light chain of 215.2, Kappa Lambda ratio of 13.37.  On review of systems, pt reports *** and denies *** and any other symptoms.  MEDICAL HISTORY:  Past Medical History:  Diagnosis Date  . Anxiety   . Arthritis   . Atrial fibrillation, chronic 08/26/2015  . Borderline diabetic   . Chronic anticoagulation 12/04/2018   CHADS VASC-3-Xarelto  . GERD (gastroesophageal reflux disease)   . Glaucoma   . Hard of hearing   . History of gastritis   . History of kidney stones   . History  of stomach ulcers   . Hx of migraines    no migraines since he was in his 72's  . Hypertension   . Osteoarthritis of right knee 12/13/2019  . Peripheral neuropathy 12/26/2017  . Primary osteoarthritis of left hip 10/25/2015  . Sleep apnea    CPAP  not currently working  couldnt breathe with it laying down trying something different end of  April or May 2021    SURGICAL HISTORY: Past Surgical History:  Procedure Laterality Date  . CATARACT EXTRACTION Bilateral 2014  . COLONOSCOPY    .  ESOPHAGOGASTRODUODENOSCOPY    . EYE SURGERY    . INGUINAL HERNIA REPAIR Left    x 2  . Rotator cuff surgery Right 2013  . TOTAL HIP ARTHROPLASTY Left 10/27/2015   Procedure: TOTAL HIP ARTHROPLASTY ANTERIOR APPROACH;  Surgeon: Frederik Pear, MD;  Location: [place];  Service: Orthopedics;  Laterality: Left;  . TOTAL KNEE ARTHROPLASTY Right 12/17/2019   Procedure: RIGHT TOTAL KNEE ARTHROPLASTY;  Surgeon: Frederik Pear, MD;  Location: [facility];  Service: Orthopedics;  Laterality: Right;    SOCIAL HISTORY: Social History   Socioeconomic History  . Marital status: Married    Spouse name: Not on file  . Number of children: 2  . Years of education: Not on file  . Highest education level: Not on file  Occupational History  . Occupation: Golf course  Tobacco Use  . Smoking status: Never Smoker  . Smokeless tobacco: Never Used  Vaping Use  . Vaping Use: Never used  Substance and Sexual Activity  . Alcohol use: Yes    Alcohol/week: 1.0 standard drink    Types: 1 Glasses of wine per week    Comment: daily  . Drug use: No  . Sexual activity: Not Currently  Other Topics Concern  . Not on file  Social History Narrative   Lives with wife   Caffeine use: 2 cups coffee daily   Gun smith   Right handed    Social Determinants of Health   Financial Resource Strain: Not on file  Food Insecurity: Not on file  Transportation Needs: Not on file  Physical Activity: Not on file  Stress: Not on file  Social Connections: Not on file  Intimate Partner Violence: Not on file    FAMILY HISTORY: Family History  Problem Relation Age of Onset  . Hyperlipidemia Brother   . Stroke Sister 74    ALLERGIES:  has No Known Allergies.  MEDICATIONS:  Current Outpatient Medications  Medication Sig Dispense Refill  . dorzolamide (TRUSOPT) 2 % ophthalmic solution 1 drop into affected eye    . dorzolamide-timolol (COSOPT) 22.3-6.8 MG/ML ophthalmic solution Place 1 drop into both eyes 2 (two) times daily.   8   . hydrochlorothiazide (HYDRODIURIL) 12.5 MG tablet Take 12.5 mg by mouth every evening.     . metoprolol succinate (TOPROL-XL) 50 MG 24 hr tablet TAKE 1 TABLET(50 MG) BY MOUTH DAILY 90 tablet 3  . Multiple Vitamin (MULTIVITAMIN WITH MINERALS) TABS tablet Take 1 tablet by mouth daily.    Marland Kitchen olmesartan (BENICAR) 40 MG tablet Take 40 mg by mouth daily.   4  . Probiotic Product (PROBIOTIC DAILY PO) Take 1 capsule by mouth every evening.    . rivaroxaban (XARELTO) 20 MG TABS tablet TAKE 1 TABLET BY MOUTH DAILY WITH SUPPER 30 tablet 3   No current facility-administered medications for this visit.    REVIEW OF SYSTEMS:   10 Point review of Systems was done  is negative except as noted above.Marland Kitchen   PHYSICAL EXAMINATION:  *** GENERAL:alert, in no acute distress and comfortable SKIN: no acute rashes, no significant lesions EYES: conjunctiva are pink and non-injected, sclera anicteric OROPHARYNX: MMM, no exudates, no oropharyngeal erythema or ulceration NECK: supple, no JVD LYMPH:  no palpable lymphadenopathy in the cervical, axillary or inguinal regions LUNGS: clear to auscultation b/l with normal respiratory effort HEART: regular rate & rhythm ABDOMEN:  normoactive bowel sounds , non tender, not distended. Extremity: no pedal edema PSYCH: alert & oriented x 3 with fluent speech NEURO: no focal motor/sensory deficits  LABORATORY DATA:  I have reviewed the data as listed  . CBC Latest Ref Rng & Units 12/12/2020 12/18/2019 12/12/2019  WBC 4.0 - 10.5 K/uL 6.8 9.5 6.8  Hemoglobin 13.0 - 17.0 g/dL 15.7 14.3 15.5  Hematocrit 39.0 - 52.0 % 46.2 43.6 48.2  Platelets 150 - 400 K/uL 154 154 161    . CMP Latest Ref Rng & Units 12/12/2020 12/18/2019 12/12/2019  Glucose 70 - 99 mg/dL 105(H) 265(H) 114(H)  BUN 8 - 23 mg/dL 21 18 22   Creatinine 0.61 - 1.24 mg/dL 0.94 0.86 0.80  Sodium 135 - 145 mmol/L 141 136 140  Potassium 3.5 - 5.1 mmol/L 4.1 4.3 4.2  Chloride 98 - 111 mmol/L 106 102 105  CO2 22 - 32  mmol/L 23 24 27   Calcium 8.9 - 10.3 mg/dL 8.8(L) 8.6(L) 9.5  Total Protein 6.5 - 8.1 g/dL 6.9 - -  Total Bilirubin 0.3 - 1.2 mg/dL 0.8 - -  Alkaline Phos 38 - 126 U/L 68 - -  AST 15 - 41 U/L 29 - -  ALT 0 - 44 U/L 31 - -   02/03/18 BM Report:    02/03/18 Flow Cytometry:   02/03/18 Cytogenetics:    RADIOGRAPHIC STUDIES: I have personally reviewed the radiological images as listed and agreed with the findings in the report. No results found.  ASSESSMENT & PLAN:   80 y.o. male with  1. Light chain MGUS  PET/CT from 02/06/18 did not reveal any bone lesions and revealed Mild low level FDG uptake throughout the bone marrow is noted.  Labs upon initial presentation from 02/03/18 and 01/24/18, Free kappa lt chains in the urine were elevated at 93.20. No anemia, no abnormal kidney functions. Nonspecific and may be physiologic.  IgM increase noted without M-spike 02/03/18 BM bx pathology indicated 2% plasma cells 02/03/18 Cytogenetics showed normal genetics without specific mutation Slow moving lymphoma or plasma cell disorder vs reactive to chronic inflammation with frequent sinus and ear infections ?  2. Significant Pulmonary Arterial HTN - likely due to suboptimally treated sleep apnea  -Will defer further management (and referral to Pulmonary/Cardiology) to PCP -No overt findings suggestive of cardiac amyloidosis   PLAN: -Discussed pt labwork, 12/12/2020; ***    -Follow up with PCP for age-appropriate cancer screening and symptom-directed work up -Continue follow up with Neurology for management of neuropathy -Recommend pt f/u with PCP regarding Pulmonary HTN -Will see back in ***   FOLLOW UP: ***   The total time spent in the appt was *** minutes and more than 50% was on counseling and direct patient cares.  All of the patient's questions were answered with apparent satisfaction. The patient knows to call the clinic with any problems, questions or concerns.   Sullivan Lone MD [place] AAHIVMS [facility] Hematology/Oncology Physician [facility]  (Office):       [phone] (Work cell):  [phone] (Fax):           [phone]  01/03/2021 9:17 AM  I, Reinaldo Raddle, am acting as scribe for Dr. Sullivan Lone, MD.

## 2021-01-05 ENCOUNTER — Telehealth: Payer: Self-pay | Admitting: Hematology

## 2021-01-05 ENCOUNTER — Inpatient Hospital Stay: Payer: Medicare Other | Admitting: Hematology

## 2021-01-05 NOTE — Telephone Encounter (Signed)
R/s appt per 5/2 sch msg. Pt aware.  

## 2021-01-08 DIAGNOSIS — Z96642 Presence of left artificial hip joint: Secondary | ICD-10-CM | POA: Diagnosis not present

## 2021-01-08 DIAGNOSIS — Z96651 Presence of right artificial knee joint: Secondary | ICD-10-CM | POA: Diagnosis not present

## 2021-01-08 DIAGNOSIS — M25561 Pain in right knee: Secondary | ICD-10-CM | POA: Diagnosis not present

## 2021-01-08 DIAGNOSIS — M25552 Pain in left hip: Secondary | ICD-10-CM | POA: Diagnosis not present

## 2021-01-27 NOTE — Progress Notes (Signed)
HEMATOLOGY/ONCOLOGY CLINIC NOTE  Date of Service: 01/28/2021  Patient Care Team: Glen Amel, MD as PCP - General (Family Medicine) Glen Breeding, MD as PCP - Cardiology (Cardiology)  CHIEF COMPLAINTS/PURPOSE OF CONSULTATION:  Elevated serum kappa light chains  HISTORY OF PRESENTING ILLNESS:   Glen Bolton is a wonderful 80 y.o. male who has been referred to [place] by my colleague Dr Glen Bolton for evaluation and management of Elevated serum kappa light chains. He is accompanied today by his wife. The pt reports that he is doing well overall.   The pt reports that he had no symptoms before his abnormal lab results prompting his initial visit with Dr Glen Bolton. He denies any concerns for inflammation besides an ear infection for the last 3 weeks and a viral infection as well. He is being followed by his PCP and has had a 20dB drop in his right ear threshold. He has had upper respiratory symptoms including a bad cough that hurt his abdomen which is coupled by a bruise at his lower abdomen. He denies any trauma to the area but does take Xarelto for his Afib. He has been treated with a z-pack and other antibiotics. He has had tubes put in his ear 3 times in the past but denies any ear pain.   He notes that he has arthritis in his knees but denies concern for rheumatoid arthritis.   Of note prior to the patient's visit today, pt has had PET/CT completed on 02/06/18 with results revealing 1. No hypermetabolic mass or adenopathy identified. 2. No evidence for hepatomegaly or splenomegaly. 3. Mild low level FDG uptake throughout the bone marrow is noted. Nonspecific and may be physiologic. 4.  Aortic Atherosclerosis (ICD10-I70.0). 5. Bilateral maxillary sinus disease.   Surgical pathology 02/03/18 revealed HYPERCELLULAR BONE MARROW FOR AGE WITH TRILINEAGE HEMATOPOIESIS. - A MINOR PLASMA CELL COMPONENT WITH KAPPA LIGHT CHAIN EXCESS. - A FEW SMALL LYMPHOID AGGREGATES PRESENT.   Most recent lab  results (02/03/18) of CBC w/ diff  is as follows: all values are WNL. UPEP Light chains 01/26/18 showed Total Protein ur/day at 156, Free kappa lt chains, ur at 93.20, Free lambda lt chains at 7.61, Free Kappa/Lambda ratio at 12.25.  MMP 01/24/18 showed all values WNL except for IgM at 244. M Protein not observed.   On review of systems, pt reports cough, ear infection, lower abdomen superficial bruising, and denies fevers, chills, night sweats, leg swelling, testicular pain or swelling, and any other symptoms.   Interval History:   Glen Bolton returns today for management and evaluation of his elevated serum free light chains. The patient's last visit with [place] was on 12/19/2019. The pt reports that he is doing well overall.  The pt reports no new symptoms or concerns. He notes some issues related to his knee replacement he had last year that is not as strong and is still bothering him slightly. He is still functioning better than pre-surgery. He continues to eat well. The pt notes he recently experienced a bad cold/allergies that gave him postnasal drip. He denies any medication changes. He is currently looking for some part-time work.  Lab results 12/12/2020 of CBC w/diff and CMP is as follows: all values are WNL except for Glucose of 105, Calcium of 8.8. 12/12/2020 MMP WNL except IgM of 237. 12/12/2020 Kappa free light chain of 215.2, Kappa lambda light chain ratio of 13.37.  On review of systems, pt reports weight gain, seasonal allergies and denies new bone pains, abdominal  pain, leg swelling, and any other symptoms.  MEDICAL HISTORY:  Past Medical History:  Diagnosis Date  . Anxiety   . Arthritis   . Atrial fibrillation, chronic 08/26/2015  . Borderline diabetic   . Chronic anticoagulation 12/04/2018   CHADS VASC-3-Xarelto  . GERD (gastroesophageal reflux disease)   . Glaucoma   . Hard of hearing   . History of gastritis   . History of kidney stones   . History of stomach ulcers   .  Hx of migraines    no migraines since he was in his 54's  . Hypertension   . Osteoarthritis of right knee 12/13/2019  . Peripheral neuropathy 12/26/2017  . Primary osteoarthritis of left hip 10/25/2015  . Sleep apnea    CPAP  not currently working  couldnt breathe with it laying down trying something different end of  April or May 2021    SURGICAL HISTORY: Past Surgical History:  Procedure Laterality Date  . CATARACT EXTRACTION Bilateral 2014  . COLONOSCOPY    . ESOPHAGOGASTRODUODENOSCOPY    . EYE SURGERY    . INGUINAL HERNIA REPAIR Left    x 2  . Rotator cuff surgery Right 2013  . TOTAL HIP ARTHROPLASTY Left 10/27/2015   Procedure: TOTAL HIP ARTHROPLASTY ANTERIOR APPROACH;  Surgeon: Frederik Pear, MD;  Location: [place];  Service: Orthopedics;  Laterality: Left;  . TOTAL KNEE ARTHROPLASTY Right 12/17/2019   Procedure: RIGHT TOTAL KNEE ARTHROPLASTY;  Surgeon: Frederik Pear, MD;  Location: [facility];  Service: Orthopedics;  Laterality: Right;    SOCIAL HISTORY: Social History   Socioeconomic History  . Marital status: Married    Spouse name: Not on file  . Number of children: 2  . Years of education: Not on file  . Highest education level: Not on file  Occupational History  . Occupation: Golf course  Tobacco Use  . Smoking status: Never Smoker  . Smokeless tobacco: Never Used  Vaping Use  . Vaping Use: Never used  Substance and Sexual Activity  . Alcohol use: Yes    Alcohol/week: 1.0 standard drink    Types: 1 Glasses of wine per week    Comment: daily  . Drug use: No  . Sexual activity: Not Currently  Other Topics Concern  . Not on file  Social History Narrative   Lives with wife   Caffeine use: 2 cups coffee daily   Gun sm[employer]   Right handed    Social Determinants of Health   Financial Resource Strain: Not on file  Food Insecurity: Not on file  Transportation Needs: Not on file  Physical Activity: Not on file  Stress: Not on file  Social Connections: Not on file   Intimate Partner Violence: Not on file    FAMILY HISTORY: Family History  Problem Relation Age of Onset  . Hyperlipidemia Brother   . Stroke Sister 67    ALLERGIES:  has No Known Allergies.  MEDICATIONS:  Current Outpatient Medications  Medication Sig Dispense Refill  . dorzolamide (TRUSOPT) 2 % ophthalmic solution 1 drop into affected eye    . dorzolamide-timolol (COSOPT) 22.3-6.8 MG/ML ophthalmic solution Place 1 drop into both eyes 2 (two) times daily.   8  . hydrochlorothiazide (HYDRODIURIL) 12.5 MG tablet Take 12.5 mg by mouth every evening.     . metoprolol succinate (TOPROL-XL) 50 MG 24 hr tablet TAKE 1 TABLET(50 MG) BY MOUTH DAILY 90 tablet 3  . Multiple Vitamin (MULTIVITAMIN WITH MINERALS) TABS tablet Take 1 tablet by mouth  daily.    . olmesartan (BENICAR) 40 MG tablet Take 40 mg by mouth daily.   4  . Probiotic Product (PROBIOTIC DAILY PO) Take 1 capsule by mouth every evening.    . rivaroxaban (XARELTO) 20 MG TABS tablet TAKE 1 TABLET BY MOUTH DAILY WITH SUPPER 30 tablet 3   No current facility-administered medications for this visit.    REVIEW OF SYSTEMS:   10 Point review of Systems was done is negative except as noted above.  PHYSICAL EXAMINATION:  NAD. GENERAL:alert, in no acute distress and comfortable SKIN: no acute rashes, no significant lesions EYES: conjunctiva are pink and non-injected, sclera anicteric OROPHARYNX: MMM, no exudates, no oropharyngeal erythema or ulceration NECK: supple, no JVD LYMPH:  no palpable lymphadenopathy in the cervical, axillary or inguinal regions LUNGS: clear to auscultation b/l with normal respiratory effort HEART: regular rate & rhythm ABDOMEN:  normoactive bowel sounds , non tender, not distended. Extremity: no pedal edema PSYCH: alert & oriented x 3 with fluent speech NEURO: no focal motor/sensory deficits  LABORATORY DATA:  I have reviewed the data as listed  . CBC Latest Ref Rng & Units 12/12/2020 12/18/2019  12/12/2019  WBC 4.0 - 10.5 K/uL 6.8 9.5 6.8  Hemoglobin 13.0 - 17.0 g/dL 15.7 14.3 15.5  Hematocrit 39.0 - 52.0 % 46.2 43.6 48.2  Platelets 150 - 400 K/uL 154 154 161    . CMP Latest Ref Rng & Units 12/12/2020 12/18/2019 12/12/2019  Glucose 70 - 99 mg/dL 105(H) 265(H) 114(H)  BUN 8 - 23 mg/dL _0 Creatinine 0.61 - 1.24 mg/dL 0.94 0.86 0.80  Sodium 135 - 145 mmol/L 141 136 140  Potassium 3.5 - 5.1 mmol/L 4.1 4.3 4.2  Chloride 98 - 111 mmol/L 106 102 105  CO2 22 - 32 mmol/L _1 Calcium 8.9 - 10.3 mg/dL 8.8(L) 8.6(L) 9.5  Total Protein 6.5 - 8.1 g/dL 6.9 - -  Total Bilirubin 0.3 - 1.2 mg/dL 0.8 - -  Alkaline Phos 38 - 126 U/L 68 - -  AST 15 - 41 U/L 29 - -  ALT 0 - 44 U/L 31 - -   02/03/18 BM Report:    02/03/18 Flow Cytometry:   02/03/18 Cytogenetics:    RADIOGRAPHIC STUDIES: I have personally reviewed the radiological images as listed and agreed with the findings in the report. No results found.  ASSESSMENT & PLAN:   80 y.o. male with  1. Light chain MGUS  PET/CT from 02/06/18 did not reveal any bone lesions and revealed Mild low level FDG uptake throughout the bone marrow is noted.  Labs upon initial presentation from 02/03/18 and 01/24/18, Free kappa lt chains in the urine were elevated at 93.20. No anemia, no abnormal kidney functions. Nonspecific and may be physiologic.  IgM increase noted without M-spike 02/03/18 BM bx pathology indicated 2% plasma cells 02/03/18 Cytogenetics showed normal genetics without specific mutation Slow moving lymphoma or plasma cell disorder vs reactive to chronic inflammation with frequent sinus and ear infections ?  2. Significant Pulmonary Arterial HTN - likely due to suboptimally treated sleep apnea  -Will defer further management (and referral to Pulmonary/Cardiology) to PCP -No overt findings suggestive of cardiac amyloidosis  PLAN: -Discussed pt labwork, 12/12/2020; blood counts and chemistries normal, MMP and light chains are  stable.  -Discussed CRAB criteria: no hypercalcemia, no new renal failure, no anemia, and no bone lesions. -Advised pt there does not appear to be any progression to Multiple Myeloma at  this time. Will continue to monitor. -Advised pt that at this time we would consider this to be Light Chain MGUS. It is behaving very stable and well. -Recommended pt receive the second COVID booster shot as recently approved. Advised pt to wait 4-6 months following first booster shot before getting this. -Follow up with PCP for age-appropriate cancer screening and symptom-directed work up. -Discussed consolidation of care w PCP. Advised pt that if his PCP is comfortable with it, then we can have them monitor for any changes. -Will see back in 12 months with labs.   FOLLOW UP: RTC with Dr Irene Limbo in 12 months Plz schedule labs 1 week prior to clinic visit   The total time spent in the appt was 20 minutes and more than 50% was on counseling and direct patient cares.  All of the patient's questions were answered with apparent satisfaction. The patient knows to call the clinic with any problems, questions or concerns.   Sullivan Lone MD [place] AAHIVMS [facility] Hematology/Oncology Physician [facility]  (Office):       [phone] (Work cell):  [phone] (Fax):           [phone]  01/28/2021 10:34 AM  I, Reinaldo Raddle, am acting as scribe for Dr. Sullivan Lone, MD.     .I have reviewed the above documentation for accuracy and completeness, and I agree with the above. Brunetta Genera MD

## 2021-01-28 ENCOUNTER — Other Ambulatory Visit: Payer: Self-pay

## 2021-01-28 ENCOUNTER — Telehealth: Payer: Self-pay | Admitting: Hematology

## 2021-01-28 ENCOUNTER — Inpatient Hospital Stay: Payer: Medicare Other | Attending: Hematology | Admitting: Hematology

## 2021-01-28 VITALS — BP 133/72 | HR 104 | Temp 98.1°F | Resp 18 | Wt 272.8 lb

## 2021-01-28 DIAGNOSIS — R768 Other specified abnormal immunological findings in serum: Secondary | ICD-10-CM

## 2021-01-28 DIAGNOSIS — I4891 Unspecified atrial fibrillation: Secondary | ICD-10-CM | POA: Diagnosis not present

## 2021-01-28 DIAGNOSIS — E8581 Light chain (AL) amyloidosis: Secondary | ICD-10-CM | POA: Insufficient documentation

## 2021-01-28 DIAGNOSIS — M1990 Unspecified osteoarthritis, unspecified site: Secondary | ICD-10-CM | POA: Diagnosis not present

## 2021-01-28 DIAGNOSIS — Z8711 Personal history of peptic ulcer disease: Secondary | ICD-10-CM | POA: Insufficient documentation

## 2021-01-28 DIAGNOSIS — I2721 Secondary pulmonary arterial hypertension: Secondary | ICD-10-CM | POA: Diagnosis not present

## 2021-01-28 DIAGNOSIS — K219 Gastro-esophageal reflux disease without esophagitis: Secondary | ICD-10-CM | POA: Insufficient documentation

## 2021-01-28 DIAGNOSIS — D472 Monoclonal gammopathy: Secondary | ICD-10-CM

## 2021-01-28 DIAGNOSIS — Z87442 Personal history of urinary calculi: Secondary | ICD-10-CM | POA: Diagnosis not present

## 2021-01-28 DIAGNOSIS — G4730 Sleep apnea, unspecified: Secondary | ICD-10-CM | POA: Insufficient documentation

## 2021-01-28 DIAGNOSIS — Z79899 Other long term (current) drug therapy: Secondary | ICD-10-CM | POA: Diagnosis not present

## 2021-01-28 DIAGNOSIS — I700 Atherosclerosis of aorta: Secondary | ICD-10-CM | POA: Insufficient documentation

## 2021-01-28 DIAGNOSIS — I10 Essential (primary) hypertension: Secondary | ICD-10-CM | POA: Insufficient documentation

## 2021-01-28 NOTE — Telephone Encounter (Signed)
Scheduled follow-up appointments per 5/25 los. Patient is aware.

## 2021-02-09 ENCOUNTER — Encounter: Payer: Self-pay | Admitting: Cardiovascular Disease

## 2021-02-09 ENCOUNTER — Ambulatory Visit: Payer: Medicare Other | Admitting: Cardiovascular Disease

## 2021-02-09 ENCOUNTER — Other Ambulatory Visit: Payer: Self-pay

## 2021-02-09 VITALS — BP 140/72 | HR 81 | Ht 70.0 in | Wt 274.0 lb

## 2021-02-09 DIAGNOSIS — G4733 Obstructive sleep apnea (adult) (pediatric): Secondary | ICD-10-CM | POA: Diagnosis not present

## 2021-02-09 DIAGNOSIS — I10 Essential (primary) hypertension: Secondary | ICD-10-CM | POA: Diagnosis not present

## 2021-02-09 DIAGNOSIS — I4821 Permanent atrial fibrillation: Secondary | ICD-10-CM

## 2021-02-09 DIAGNOSIS — Z7901 Long term (current) use of anticoagulants: Secondary | ICD-10-CM | POA: Diagnosis not present

## 2021-02-09 NOTE — Patient Instructions (Signed)
Medication Instructions:  Your physician recommends that you continue on your current medications as directed. Please refer to the Current Medication list given to you today.  *If you need a refill on your cardiac medications before your next appointment, please call your pharmacy*   Lab Work: None ordered.   Testing/Procedures: None ordered.   Follow-Up: At [facility], you and your health needs are our priority.  As part of our continuing mission to provide you with exceptional heart care, we have created designated Provider Care Teams.  These Care Teams include your primary Cardiologist (physician) and Advanced Practice Providers (APPs -  Physician Assistants and Nurse Practitioners) who all work together to provide you with the care you need, when you need it.  We recommend signing up for the patient portal called "MyChart".  Sign up information is provided on this After Visit Summary.  MyChart is used to connect with patients for Virtual Visits (Telemedicine).  Patients are able to view lab/test results, encounter notes, upcoming appointments, etc.  Non-urgent messages can be sent to your provider as well.   To learn more about what you can do with MyChart, go to [email or web address].    Your next appointment:   6 month(s) - SLEEP CLINIC  The format for your next appointment:   In Person  Provider:   Shelva Majestic, MD

## 2021-02-09 NOTE — Progress Notes (Signed)
ID:  Glen Bolton, DOB 1941/02/17, MRN [id number]   PCP:  Glen Amel, MD                    Cardiologist:   Glen Breeding, MD   HPI: Glen Bolton is a 80 y.o. male who presents for 3 month follow-up sleep evaluation  Glen Bolton followed by Dr. Percival Bolton for cardiology care and has a history of atrial fibrillation, hypertension, and GERD.  Due to concerns for obstructive sleep apnea.  He was referred for diagnostic polysomnogram which was done on 11/20/2015.  This confirmed moderate obstructive sleep apnea with an overall AHI of 18.2 per hour.  Events were worse with supine sleep with an HI of 21.6 per hour.  He had significant oxygen desaturation to 79%.  There was reduced sleep efficiency of only 64.4%.  He had loud snoring.  He was in atrial fibrillation for the duration of the study and there were occasional PVCs.  He ultimately underwent a complete CPAP titration study on 01/12/2016 and CPAP was titrated up to 13 cm water pressure.  He has been on CPAP therapy since June.  His DME company [facility].  A download from June 29 through 04/02/2016 revealed 83% of days with usage and 70% of days with usage greater than 4 hours.  He was just meeting Medicare compliance standards.  His average usage per night was 5 hours and 26 minutes.  At this at pressure of 13 cm, AHI was excellent at 0.7 despite a significant mask leak at 45.1 L/m.  He has been using nasal pillows.  Typically goes to bed at 10 PM, but wakes up between 5 and 6 AM.  He has had difficulty with dry mouth as well as intermittent nasal congestion.  He feels that he has more energy since initiating CPAP therapy.  He is unaware of any breakthrough snoring. An Epworth scale following CPAP initiation in 2017  endorsed at 4 and shown arguing against residual daytime sleepiness.  I  saw Glen Bolton in September 2018 and over the previous year he continued to use CPAP therapy.However, for the last several months he has had problems with  nasal congestion as well as postnasal drip.  When his nose gets congested he has not been using CPAP and average use is approximately 4 days per week.  He feels improved since he has been using CPAP.  His prior 3 times per night nocturia is now down to 0-1 time per night.  His sleep is restorative.  He is unaware of breakthrough snoring.  He denies residual daytime sleepiness. He typically goes to bed between 9 and 10 PM and wakes up at 6 AM.  An Epworth Sleepiness Scale score was recalculated in the office today and this endorsed at 8, again arguing against excessive daytime sleepiness.  He was evaluated by Glen Bolton in February 2021 at that time stated that he was not typically compliant with CPAP due to his postnasal drip and nasal mask.  As result, he has not been using CPAP.   I saw him in June 2021 and a download did not show any recent use.   An old download however did show at 13 cm water pressure his AHI was excellent at 0.7.  He cannot sleep on his back.  He has undergone knee replacement surgery in March 20021 and had some pain while sleeping.  He has been on hydrochlorothiazide 12.5 mg, metoprolol succinate 50 mg,  and olmesartan 40 mg daily for hypertension.  He is anticoagulated on Xarelto.  He is followed in hematology clinic with elevated serum kappa light chains.  During his evaluation, I suggested he change his mask to a ResMed air fit F 30i mask.  I also changed his CPAP set pressure to an auto mode and in particular if he were to sleep on his back he would require higher pressure.  He was  seen by Dr. Percival Bolton and during his evaluation the patient stated that due to mask issues he had not been using the CPAP.  As result Glen Bolton was contacted and he was added onto my schedule today , November 06, 2020 for follow-up evaluation.  He admitted to snoring, nonrestorative sleep, and nocturia 2 times per night.    An Epworth Sleepiness Scale score was recalculated in the office today and this  endorsed at 10 as shown below:  Epworth Sleepiness Scale: Situation   Chance of Dozing/Sleeping (0 = never , 1 = slight chance , 2 = moderate chance , 3 = high chance )   sitting and reading 2   watching TV 2   sitting inactive in a public place 1   being a passenger in a motor vehicle for an hour or more 0   lying down in the afternoon 3   sitting and talking to someone 0   sitting quietly after lunch (no alcohol) 2   while stopped for a few minutes in traffic as the driver 0   Total Score  10   During that evaluation, he told me that he had stopped using CPAP in over a year and a half.  His machine is not an auto CPAP unit and had been set at a pressure of 13 cm.  At that time I decreased his pressure to 10 cm and increased his ramp start pressure from 4 to 6 cm of water with continuation of auto ramp capability.  I again had a long discussion with him regarding adverse consequences of sleep apnea if left untreated with reference to his cardiovascular health.  He has a history of pulmonary hypertension as well as atrial fibrillation which undoubtedly may have been contributed by his sleep apnea.  Since I last saw him, he states he could not use CPAP for 2 weeks secondary to a cough.  He has been using CPAP with his fixed pressure at 10 cm.  Unfortunately his machine set up was in 2017 and is no longer wirelessly downloadable since effective April machines only with 5G capability can be downloaded and his is 3G.  He brought his machine with him.  In I interrogated his device.  Over the last 3 months he is only used his device 9 of 90 days.  There has been no use over the last 30 days.  AHI was 1.5 when used.  Over the past 6 months he has used his device 12 out of 180 days with an AHI of 1.3.  He has not been successful with weight loss.  He continues to be on hydrochlorothiazide 12.5 mg daily, olmesartan 40 mg and metoprolol succinate 50 mg daily for hypertension.  With his atrial fibrillation he  continues to be anticoagulated on Xarelto 20 mg.  He presents for evaluation.   Past Medical History:  Diagnosis Date   Anxiety    Arthritis    Atrial fibrillation, chronic 08/26/2015   Borderline diabetic    Chronic anticoagulation 12/04/2018   CHADS VASC-3-Xarelto  GERD (gastroesophageal reflux disease)    Glaucoma    Hard of hearing    History of gastritis    History of kidney stones    History of stomach ulcers    Hx of migraines    no migraines since he was in his 27's   Hypertension    Osteoarthritis of right knee 12/13/2019   Peripheral neuropathy 12/26/2017   Primary osteoarthritis of left hip 10/25/2015   Sleep apnea    CPAP  not currently working  couldnt breathe with it laying down trying something different end of  April or May 2021    Past Surgical History:  Procedure Laterality Date   CATARACT EXTRACTION Bilateral 2014   COLONOSCOPY     ESOPHAGOGASTRODUODENOSCOPY     EYE SURGERY     INGUINAL HERNIA REPAIR Left    x 2   Rotator cuff surgery Right 2013   TOTAL HIP ARTHROPLASTY Left 10/27/2015   Procedure: TOTAL HIP ARTHROPLASTY ANTERIOR APPROACH;  Surgeon: Frederik Pear, MD;  Location: [facility];  Service: Orthopedics;  Laterality: Left;   TOTAL KNEE ARTHROPLASTY Right 12/17/2019   Procedure: RIGHT TOTAL KNEE ARTHROPLASTY;  Surgeon: Frederik Pear, MD;  Location: [facility];  Service: Orthopedics;  Laterality: Right;    No Known Allergies  Current Outpatient Medications  Medication Sig Dispense Refill   albuterol (VENTOLIN HFA) 108 (90 Base) MCG/ACT inhaler 2 puffs as needed     albuterol (VENTOLIN HFA) 108 (90 Base) MCG/ACT inhaler SMARTSIG:2 Puff(s) By Mouth Every 4 Hours PRN     dorzolamide (TRUSOPT) 2 % ophthalmic solution 1 drop into affected eye     dorzolamide-timolol (COSOPT) 22.3-6.8 MG/ML ophthalmic solution Place 1 drop into both eyes 2 (two) times daily.   8   hydrochlorothiazide (HYDRODIURIL) 12.5 MG tablet Take 12.5 mg by mouth every evening.      metoprolol  succinate (TOPROL-XL) 50 MG 24 hr tablet TAKE 1 TABLET(50 MG) BY MOUTH DAILY 90 tablet 3   Multiple Vitamin (MULTIVITAMIN WITH MINERALS) TABS tablet Take 1 tablet by mouth daily.     olmesartan (BENICAR) 40 MG tablet Take 40 mg by mouth daily.   4   Probiotic Product (PROBIOTIC DAILY PO) Take 1 capsule by mouth every evening.     rivaroxaban (XARELTO) 20 MG TABS tablet TAKE 1 TABLET BY MOUTH DAILY WITH SUPPER 30 tablet 3   No current facility-administered medications for this visit.    Social History   Socioeconomic History   Marital status: Married    Spouse name: Not on file   Number of children: 2   Years of education: Not on file   Highest education level: Not on file  Occupational History   Occupation: Golf course  Tobacco Use   Smoking status: Never   Smokeless tobacco: Never  Vaping Use   Vaping Use: Never used  Substance and Sexual Activity   Alcohol use: Yes    Alcohol/week: 1.0 standard drink    Types: 1 Glasses of wine per week    Comment: daily   Drug use: No   Sexual activity: Not Currently  Other Topics Concern   Not on file  Social History Narrative   Lives with wife   Caffeine use: 2 cups coffee daily   [employer]   Right handed    Social Determinants of Health   Financial Resource Strain: Not on file  Food Insecurity: Not on file  Transportation Needs: Not on file  Physical Activity: Not on file  Stress: Not on file  Social Connections: Not on file  Intimate Partner Violence: Not on file   Additional social history is notable that is that he works at [employer] course.He is now working 5 days per week at this golf course and 1 day at [employer] [place].  Family History  Problem Relation Age of Onset   Hyperlipidemia Brother    Stroke Sister 45     ROS General: Negative; No fevers, chills, or night sweats HEENT: Negative; No changes in vision or hearing, sinus congestion, difficulty swallowing Pulmonary: Negative; No  cough, wheezing, shortness of breath, hemoptysis Cardiovascular: Permanent atrial fibrillation GI: Negative; No nausea, vomiting, diarrhea, or abdominal pain GU: Negative; No dysuria, hematuria, or difficulty voiding Musculoskeletal: Negative; no myalgias, joint pain, or weakness Hematologic: Elevated serum kappa light chains Endocrine: Negative; no heat/cold intolerance Neuro: Negative; no changes in balance, headaches Skin: Negative; No rashes or skin lesions Psychiatric: Negative; No behavioral problems, depression Sleep: see above;    Physical Exam BP 140/72 (BP Location: Left Arm, Patient Position: Sitting)   Pulse 81   Ht _0  (1.778 m)   Wt 274 lb (124.3 kg)   SpO2 97%   BMI 39.31 kg/m    Repeat blood pressure by me was 128/74  Wt Readings from Last 3 Encounters:  02/09/21 274 lb (124.3 kg)  01/28/21 272 lb 12.8 oz (123.7 kg)  11/06/20 272 lb 9.6 oz (123.7 kg)   General: Alert, oriented, no distress.  Skin: normal turgor, no rashes, warm and dry HEENT: Normocephalic, atraumatic. Pupils equal round and reactive to light; sclera anicteric; extraocular muscles intact;  Nose without nasal septal hypertrophy Mouth/Parynx benign; Mallinpatti scale 3/4 Neck: Thick neck; no JVD, no carotid bruits; normal carotid upstroke Lungs: clear to ausculatation and percussion; no wheezing or rales Chest wall: without tenderness to palpitation Heart: PMI not displaced, RRR, s1 s2 normal, 1/6 systolic murmur, no diastolic murmur, no rubs, gallops, thrills, or heaves Abdomen: soft, nontender; no hepatosplenomehaly, BS+; abdominal aorta nontender and not dilated by palpation. Back: no CVA tenderness Pulses 2+ Musculoskeletal: full range of motion, normal strength, no joint deformities Extremities: no clubbing cyanosis or edema, Homan's sign negative  Neurologic: grossly nonfocal; Cranial nerves grossly wnl Psychologic: Normal mood and affect   ECG (independently read by me): Atrial  fibrillation at 73 QSV1-18 May 2017 ECG (independently read by me): Atrial fibrillation at 71 bpm.  PVC.  ECG from 08/26/2015 was reviewed which showed atrial fibrillation at 93 bpm.  LABS:  BMP Latest Ref Rng & Units 12/12/2020 12/18/2019 12/12/2019  Glucose 70 - 99 mg/dL 105(H) 265(H) 114(H)  BUN 8 - 23 mg/dL _1 Creatinine 0.61 - 1.24 mg/dL 0.94 0.86 0.80  Sodium 135 - 145 mmol/L 141 136 140  Potassium 3.5 - 5.1 mmol/L 4.1 4.3 4.2  Chloride 98 - 111 mmol/L 106 102 105  CO2 22 - 32 mmol/L _2 Calcium 8.9 - 10.3 mg/dL 8.8(L) 8.6(L) 9.5     Hepatic Function Latest Ref Rng & Units 12/12/2020 06/15/2019 12/08/2018  Total Protein 6.5 - 8.1 g/dL 6.9 6.7 6.8  Albumin 3.5 - 5.0 g/dL 3.9 4.0 3.9  AST 15 - 41 U/L 29 28 36  ALT 0 - 44 U/L 31 29 37  Alk Phosphatase 38 - 126 U/L 68 62 76  Total Bilirubin 0.3 - 1.2 mg/dL 0.8 0.9 0.9     CBC Latest Ref Rng & Units 12/12/2020 12/18/2019  12/12/2019  WBC 4.0 - 10.5 K/uL 6.8 9.5 6.8  Hemoglobin 13.0 - 17.0 g/dL 15.7 14.3 15.5  Hematocrit 39.0 - 52.0 % 46.2 43.6 48.2  Platelets 150 - 400 K/uL 154 154 161     Lipid Panel  No results found for: CHOL, TRIG, HDL, CHOLHDL, VLDL, LDLCALC, LDLDIRECT   RADIOLOGY: No results found.  IMPRESSION:  1. OSA (obstructive sleep apnea)   2. Permanent atrial fibrillation ([place])   3. Essential hypertension   4. Chronic anticoagulation     ASSESSMENT AND PLAN: Glen Bolton is a 80 year old male who has a history of hypertension and permanent atrial fibrillation.  He is on Xarelto for chronic anticoagulation.  He was found to have moderate obstructive sleep apnea on his sleep study from Spring 2017, which was more pronounced with supine posture.  He had reduced sleep efficiency, and had abnormal sleep architecture with absence of slow-wave sleep and prolonged latency to REM sleep.  He had significant oxygen desaturation to a nadir of 79% and  loud snoring.  When CPAP was initiated, he did note  significant improvement and initial download had shown excellent response with an AHI of 0.7 at 13 cm of water.  He has had difficulty using his mask due to significant nasal congestion.  He also cannot sleep on his back which most likely is due to severe sleep apnea.  At that time I suggested switching his mask to a ResMed air fit F 30i mask which will allow him to breathe through his mouth if his nose is significantly congested.  When I saw him in March 2022 he had stopped using therapy for over a year and a half.  Over the past 6 months he is only used CPAP for 1280 days and only 9 out of the last 90 days.  He has not used therapy over the past 30 days.  However when he uses CPAP AHI continues to be excellent with AHI ranges of 1.3-1.7/h.  I again had a long discussion with him.  Unfortunately his machine is no longer wirelessly downloadable since it has 3G Internet and since mid April only the newer units with 5G can be downloaded wirelessly.  I again did stress the importance of continued use.  He has permanent atrial fibrillation as well as hypertension in addition to significant obesity.  If hopefully he can reinstitute therapy we may ultimately be able to change him to a ResMed AirSense 11 auto unit which was recently released, but unfortunately due to supply chain issues there is typically a weight of anywhere from 3 to 6 months.  His blood pressure today is stable on repeat by me at 128/74 on his regimen of olmesartan 40 mg, metoprolol succinate 50 mg, and HCTZ 12.5 mg.  I will see him in 6 months for reevaluation or sooner as necessary.  Troy Sine, MD, [facility]  02/15/2021 4:23 PM

## 2021-02-15 ENCOUNTER — Encounter: Payer: Self-pay | Admitting: Cardiovascular Disease

## 2021-03-21 ENCOUNTER — Other Ambulatory Visit: Payer: Self-pay | Admitting: Cardiology

## 2021-03-23 NOTE — Telephone Encounter (Signed)
70m, 123.7kg, scr 0.94 12/12/20, lovw/hochrein 11/03/20, ccr 113.3

## 2021-04-16 DIAGNOSIS — D6869 Other thrombophilia: Secondary | ICD-10-CM | POA: Diagnosis not present

## 2021-04-16 DIAGNOSIS — E1169 Type 2 diabetes mellitus with other specified complication: Secondary | ICD-10-CM | POA: Diagnosis not present

## 2021-04-16 DIAGNOSIS — I4891 Unspecified atrial fibrillation: Secondary | ICD-10-CM | POA: Diagnosis not present

## 2021-04-16 DIAGNOSIS — Z79899 Other long term (current) drug therapy: Secondary | ICD-10-CM | POA: Diagnosis not present

## 2021-04-16 DIAGNOSIS — Z0001 Encounter for general adult medical examination with abnormal findings: Secondary | ICD-10-CM | POA: Diagnosis not present

## 2021-04-16 DIAGNOSIS — E7800 Pure hypercholesterolemia, unspecified: Secondary | ICD-10-CM | POA: Diagnosis not present

## 2021-04-16 DIAGNOSIS — D472 Monoclonal gammopathy: Secondary | ICD-10-CM | POA: Diagnosis not present

## 2021-04-16 DIAGNOSIS — I10 Essential (primary) hypertension: Secondary | ICD-10-CM | POA: Diagnosis not present

## 2021-04-20 ENCOUNTER — Telehealth: Payer: Self-pay | Admitting: Cardiology

## 2021-04-20 MED ORDER — RIVAROXABAN 20 MG PO TABS: 20.0000 mg | ORAL_TABLET | Freq: Every day | ORAL | 5 refills | Status: DC

## 2021-04-20 NOTE — Telephone Encounter (Signed)
*  STAT* If patient is at the pharmacy, call can be transferred to refill team.   1. Which medications need to be refilled? (please list name of each medication and dose if known) rivaroxaban (XARELTO) 20 MG TABS tablet  2. Which pharmacy/location (including street and city if local pharmacy) is medication to be sent to? [place], [place] - [address] AT [address]  3. Do they need a 30 day or 90 day supply? 30ds   Pt c/o medication issue:  1. Name of Medication: rivaroxaban (XARELTO) 20 MG TABS tablet  2. How are you currently taking this medication (dosage and times per day)? TAKE 1 TABLET BY MOUTH DAILY WITH SUPPER  3. Are you having a reaction (difficulty breathing--STAT)? no  4. What is your medication issue? Pt is out of medication and is being told that he is not able to refill until 12/22 due to his last refill sent into the pharmacy as a 90ds. PT could only afford a 30ds so only did a partial refill. Pt is needing another 30ds, please correct with pharmacy and advise pt

## 2021-04-20 NOTE — Telephone Encounter (Signed)
47m 123.7kg, scr 0.94 12/12/20, lovw/hochrein 11/03/20, ccr 113.3

## 2021-04-20 NOTE — Telephone Encounter (Signed)
Called and lmomed the pt that we sent in the 30 day refill as requested

## 2021-06-07 ENCOUNTER — Other Ambulatory Visit: Payer: Self-pay | Admitting: Cardiology

## 2021-06-23 ENCOUNTER — Other Ambulatory Visit: Payer: Self-pay

## 2021-06-23 ENCOUNTER — Ambulatory Visit (INDEPENDENT_AMBULATORY_CARE_PROVIDER_SITE_OTHER): Payer: Medicare Other | Admitting: Otolaryngology

## 2021-06-23 DIAGNOSIS — H6983 Other specified disorders of Eustachian tube, bilateral: Secondary | ICD-10-CM | POA: Diagnosis not present

## 2021-06-23 DIAGNOSIS — H6522 Chronic serous otitis media, left ear: Secondary | ICD-10-CM | POA: Diagnosis not present

## 2021-06-23 DIAGNOSIS — H903 Sensorineural hearing loss, bilateral: Secondary | ICD-10-CM | POA: Diagnosis not present

## 2021-06-23 DIAGNOSIS — J310 Chronic rhinitis: Secondary | ICD-10-CM

## 2021-06-23 MED ORDER — TRIAMCINOLONE ACETONIDE 55 MCG/ACT NA AERO: 2.0000 | INHALATION_SPRAY | Freq: Every day | NASAL | 12 refills | Status: AC

## 2021-06-23 NOTE — Progress Notes (Signed)
HPI: Glen Bolton is a 80 y.o. male who returns today for evaluation of hearing loss.  Patient has had a long history of hearing problems and wears bilateral hearing aids.  He is looking to get new hearing aids but prior to getting new hearing aids he wanted his ears checked as he has had previous history of middle ear effusion.  He had a myringotomy tube placed by myself in 2016.  This has since extruded.Marland Kitchen  He complains of some postnasal drainage and has difficulty "popping" his ears.  He is not presently using any nasal sprays.  Past Medical History:  Diagnosis Date   Anxiety    Arthritis    Atrial fibrillation, chronic 08/26/2015   Borderline diabetic    Chronic anticoagulation 12/04/2018   CHADS VASC-3-Xarelto   GERD (gastroesophageal reflux disease)    Glaucoma    Hard of hearing    History of gastritis    History of kidney stones    History of stomach ulcers    Hx of migraines    no migraines since he was in his 33's   Hypertension    Osteoarthritis of right knee 12/13/2019   Peripheral neuropathy 12/26/2017   Primary osteoarthritis of left hip 10/25/2015   Sleep apnea    CPAP  not currently working  couldnt breathe with it laying down trying something different end of  April or May 2021   Past Surgical History:  Procedure Laterality Date   CATARACT EXTRACTION Bilateral 2014   COLONOSCOPY     ESOPHAGOGASTRODUODENOSCOPY     EYE SURGERY     INGUINAL HERNIA REPAIR Left    x 2   Rotator cuff surgery Right 2013   TOTAL HIP ARTHROPLASTY Left 10/27/2015   Procedure: TOTAL HIP ARTHROPLASTY ANTERIOR APPROACH;  Surgeon: Frederik Pear, MD;  Location: [facility];  Service: Orthopedics;  Laterality: Left;   TOTAL KNEE ARTHROPLASTY Right 12/17/2019   Procedure: RIGHT TOTAL KNEE ARTHROPLASTY;  Surgeon: Frederik Pear, MD;  Location: [facility];  Service: Orthopedics;  Laterality: Right;   Social History   Socioeconomic History   Marital status: Married    Spouse name: Not on file   Number of children:  2   Years of education: Not on file   Highest education level: Not on file  Occupational History   Occupation: Golf course  Tobacco Use   Smoking status: Never   Smokeless tobacco: Never  Vaping Use   Vaping Use: Never used  Substance and Sexual Activity   Alcohol use: Yes    Alcohol/week: 1.0 standard drink    Types: 1 Glasses of wine per week    Comment: daily   Drug use: No   Sexual activity: Not Currently  Other Topics Concern   Not on file  Social History Narrative   Lives with wife   Caffeine use: 2 cups coffee daily   [employer]   Right handed    Social Determinants of Health   Financial Resource Strain: Not on file  Food Insecurity: Not on file  Transportation Needs: Not on file  Physical Activity: Not on file  Stress: Not on file  Social Connections: Not on file   Family History  Problem Relation Age of Onset   Hyperlipidemia Brother    Stroke Sister 30   No Known Allergies Prior to Admission medications   Medication Sig Start Date End Date Taking? Authorizing Provider  albuterol (VENTOLIN HFA) 108 (90 Base) MCG/ACT inhaler 2 puffs as needed 12/29/20  [provider]  albuterol (VENTOLIN HFA) 108 (90 Base) MCG/ACT inhaler SMARTSIG:2 Puff(s) By Mouth Every 4 Hours PRN 12/29/20   [provider]  dorzolamide (TRUSOPT) 2 % ophthalmic solution 1 drop into affected eye    [provider]  dorzolamide-timolol (COSOPT) 22.3-6.8 MG/ML ophthalmic solution Place 1 drop into both eyes 2 (two) times daily.  06/20/18   [provider]  hydrochlorothiazide (HYDRODIURIL) 12.5 MG tablet Take 12.5 mg by mouth every evening.     [provider]  metoprolol succinate (TOPROL-XL) 50 MG 24 hr tablet TAKE 1 TABLET(50 MG) BY MOUTH DAILY 06/08/21   Minus Breeding, MD  Multiple Vitamin (MULTIVITAMIN WITH MINERALS) TABS tablet Take 1 tablet by mouth daily.    [provider]  olmesartan (BENICAR) 40 MG tablet Take 40 mg by mouth  daily.  05/23/17   [provider]  Probiotic Product (PROBIOTIC DAILY PO) Take 1 capsule by mouth every evening.    [provider]  rivaroxaban (XARELTO) 20 MG TABS tablet Take 1 tablet (20 mg total) by mouth daily with supper. 04/20/21   Minus Breeding, MD     Positive ROS: Otherwise negative  All other systems have been reviewed and were otherwise negative with the exception of those mentioned in the HPI and as above.  Physical Exam: Constitutional: Alert, well-appearing, no acute distress Ears: External ears without lesions or tenderness. Ear canals are clear bilaterally.  Right TM appears retracted but no obvious middle ear effusion noted.  Left TM is retracted with amber-colored middle ear effusion.  Bilateral myringotomies were performed in the office today and the right middle ear space was dry.  The left middle ear space had a thick mucoid serous amber-colored fluid that was aspirated. Nasal: External nose without lesions. Septum with mild deformity and moderate rhinitis.  Both middle meatus regions were clear.  Posterior nasal cavity is clear.  No clinical evidence of active infection. Oral: Lips and gums without lesions. Tongue and palate mucosa without lesions. Posterior oropharynx clear. Neck: No palpable adenopathy or masses Respiratory: Breathing comfortably  Skin: No facial/neck lesions or rash noted.  Myringotomy  Date/Time: 06/23/2021 5:19 PM Performed by: Rozetta Nunnery, MD Authorized by: Rozetta Nunnery, MD   Consent:    Consent obtained:  Verbal   Consent given by:  Patient Anesthesia:    Anesthesia method:  Topical application   Topical anesthetic:  Phenol Procedure Details:    Location:  Left TM and right TM   Hole made in:  Inferior aspect of TM Findings:    Fluid:  Serous fluid Comments:     Myringotomies were performed on both sides using phenol as a topical anesthetic.  On the right side patient had a retracted TM with  negative pressure but no middle ear effusion noted.  On the left side he had a mucoserous effusion aspirated from the left middle ear space.  Assessment: Patient with bilateral hearing aids with bilateral sensorineural hearing loss. Chronic left serous otitis media with conductive hearing loss.  Plan: Myringotomies were performed bilaterally.  A serous effusion was aspirated from the left middle ear space. Placed him on Nasacort 2 sprays each nostril at night and discussed with him concerning trying to "pop" his ears daily or every other day to try to keep the fluid from coming back.  He had a previous myringotomy tube placed in the left ear in 2016. If he has recurrent middle ear fluid develop he may need repeat M&T and  recommended follow-up with one of the other ENT practices if needed.   Radene Journey, MD

## 2021-06-25 ENCOUNTER — Other Ambulatory Visit: Payer: Self-pay | Admitting: Cardiology

## 2021-06-25 NOTE — Telephone Encounter (Signed)
Prescription refill request for Xarelto received.  Indication:Afib Last office visit:2/22 Weight:124.3 kg Age:80 Scr:0.9 CrCl:118.93 ml/min  Prescription refilled

## 2021-06-29 ENCOUNTER — Other Ambulatory Visit: Payer: Self-pay

## 2021-06-29 ENCOUNTER — Ambulatory Visit (INDEPENDENT_AMBULATORY_CARE_PROVIDER_SITE_OTHER): Payer: Medicare Other

## 2021-06-29 ENCOUNTER — Ambulatory Visit: Payer: Medicare Other | Admitting: Podiatry

## 2021-06-29 DIAGNOSIS — M7661 Achilles tendinitis, right leg: Secondary | ICD-10-CM | POA: Diagnosis not present

## 2021-06-29 DIAGNOSIS — M7751 Other enthesopathy of right foot: Secondary | ICD-10-CM

## 2021-06-29 DIAGNOSIS — M7750 Other enthesopathy of unspecified foot: Secondary | ICD-10-CM

## 2021-06-29 DIAGNOSIS — M21861 Other specified acquired deformities of right lower leg: Secondary | ICD-10-CM | POA: Diagnosis not present

## 2021-06-29 MED ORDER — METHYLPREDNISOLONE 4 MG PO TBPK: ORAL_TABLET | ORAL | 0 refills | Status: AC

## 2021-06-29 NOTE — Patient Instructions (Signed)

## 2021-07-01 ENCOUNTER — Encounter: Payer: Self-pay | Admitting: Podiatry

## 2021-07-01 NOTE — Progress Notes (Signed)
  Subjective:  Patient ID: Glen Bolton, male    DOB: April 19, 1941,  MRN: [id number]  Chief Complaint  Patient presents with   Foot Pain    NP // Right heel pain   Nail Problem    Thick painful toenails    80 y.o. male presents with the above complaint. History confirmed with patient.  Pain is in the back of the heel he can barely walk last week.  This happened in the past.  Standing does not hurt is when he starts to lift his foot off the ground and walking  Objective:  Physical Exam: warm, good capillary refill, no trophic changes or ulcerative lesions, normal DP and PT pulses, and normal sensory exam. Left Foot: normal exam, no swelling, tenderness, instability; ligaments intact, full range of motion of all ankle/foot joints Right Foot: tenderness at Achilles tendon insertion and gastrocnemius equinus is noted with a positive silverskiold test  No images are attached to the encounter.  Radiographs: Multiple views x-ray of the left foot: no fracture, dislocation, swelling or degenerative changes noted and posterior calcaneal spur Assessment:   1. Achilles tendinitis of right lower extremity   2. Gastrocnemius equinus of right lower extremity      Plan:  Patient was evaluated and treated and all questions answered.  Discussed the etiology and treatment options for Achilles tendinitis including stretching, formal physical therapy with an eccentric exercises therapy plan, supportive shoegears such as a running shoe or sneaker, heel lifts, topical and oral medications.  We also discussed that I do not routinely perform injections in this area because of the risk of an increased damage or rupture of the tendon.  We also discussed the role of surgical treatment of this for patients who do not improve after exhausting non-surgical treatment options.  -XR reviewed with patient -Educated on stretching and icing of the affected limb. -Rx for Medrol 6-day taper. Advised on risks, benefits,  and alternatives of the medication -Heel lifts dispensed to wear in his shoes   Return in about 1 month (around 07/30/2021) for re-check Achilles tendon.

## 2021-08-04 ENCOUNTER — Ambulatory Visit: Payer: Medicare Other | Admitting: Podiatry

## 2021-08-04 ENCOUNTER — Other Ambulatory Visit: Payer: Self-pay

## 2021-08-04 DIAGNOSIS — M7661 Achilles tendinitis, right leg: Secondary | ICD-10-CM | POA: Diagnosis not present

## 2021-08-05 NOTE — Progress Notes (Signed)
  Subjective:  Patient ID: Glen Bolton, male    DOB: March 26, 1941,  MRN: [id number]  Chief Complaint  Patient presents with   Tendonitis    Follow up achilles tendonitis right    80 y.o. male returns for follow-up with the above complaint. History confirmed with patient.  Doing much better he has had 80 to 85% improvement  Objective:  Physical Exam: warm, good capillary refill, no trophic changes or ulcerative lesions, normal DP and PT pulses, and normal sensory exam. Left Foot: normal exam, no swelling, tenderness, instability; ligaments intact, full range of motion of all ankle/foot joints Right Foot: minimal tenderness to palpation good 5 out of 5 strength today  No images are attached to the encounter.  Radiographs: Multiple views x-ray of the left foot: no fracture, dislocation, swelling or degenerative changes noted and posterior calcaneal spur Assessment:   1. Achilles tendinitis of right lower extremity       Plan:  Patient was evaluated and treated and all questions answered.  Overall doing well and seems to have pretty much healed at this point.  I recommend he continue using the heel lifts and continuing his therapy exercises until he is pain-free approximately 4 to 6 weeks.  He will return to see me as needed for this or other issues.  Return if symptoms worsen or fail to improve.

## 2021-08-13 DIAGNOSIS — E7800 Pure hypercholesterolemia, unspecified: Secondary | ICD-10-CM | POA: Diagnosis not present

## 2021-08-13 DIAGNOSIS — I4891 Unspecified atrial fibrillation: Secondary | ICD-10-CM | POA: Diagnosis not present

## 2021-08-13 DIAGNOSIS — I10 Essential (primary) hypertension: Secondary | ICD-10-CM | POA: Diagnosis not present

## 2021-08-13 DIAGNOSIS — E1169 Type 2 diabetes mellitus with other specified complication: Secondary | ICD-10-CM | POA: Diagnosis not present

## 2021-09-30 ENCOUNTER — Other Ambulatory Visit: Payer: Self-pay

## 2021-09-30 ENCOUNTER — Ambulatory Visit: Payer: Medicare Other | Attending: Family Medicine | Admitting: Physical Therapy

## 2021-09-30 DIAGNOSIS — R42 Dizziness and giddiness: Secondary | ICD-10-CM | POA: Insufficient documentation

## 2021-09-30 DIAGNOSIS — H8111 Benign paroxysmal vertigo, right ear: Secondary | ICD-10-CM | POA: Insufficient documentation

## 2021-09-30 DIAGNOSIS — R2689 Other abnormalities of gait and mobility: Secondary | ICD-10-CM | POA: Insufficient documentation

## 2021-09-30 DIAGNOSIS — R2681 Unsteadiness on feet: Secondary | ICD-10-CM | POA: Insufficient documentation

## 2021-09-30 NOTE — Therapy (Signed)
[facility] [address] [place], [place], [address] Phone: [phone]   Fax:  [phone]  Physical Therapy Evaluation  Patient Details  Name: Glen Bolton MRN: [id number] Date of Birth: 1941/06/29 Referring Provider (PT): Lujean Amel, MD   Encounter Date: 09/30/2021    Past Medical History:  Diagnosis Date   Anxiety    Arthritis    Atrial fibrillation, chronic 08/26/2015   Borderline diabetic    Chronic anticoagulation 12/04/2018   CHADS VASC-3-Xarelto   GERD (gastroesophageal reflux disease)    Glaucoma    Hard of hearing    History of gastritis    History of kidney stones    History of stomach ulcers    Hx of migraines    no migraines since he was in his 3's   Hypertension    Osteoarthritis of right knee 12/13/2019   Peripheral neuropathy 12/26/2017   Primary osteoarthritis of left hip 10/25/2015   Sleep apnea    CPAP  not currently working  couldnt breathe with it laying down trying something different end of  April or May 2021    Past Surgical History:  Procedure Laterality Date   CATARACT EXTRACTION Bilateral 2014   COLONOSCOPY     ESOPHAGOGASTRODUODENOSCOPY     EYE SURGERY     INGUINAL HERNIA REPAIR Left    x 2   Rotator cuff surgery Right 2013   TOTAL HIP ARTHROPLASTY Left 10/27/2015   Procedure: TOTAL HIP ARTHROPLASTY ANTERIOR APPROACH;  Surgeon: Frederik Pear, MD;  Location: [facility];  Service: Orthopedics;  Laterality: Left;   TOTAL KNEE ARTHROPLASTY Right 12/17/2019   Procedure: RIGHT TOTAL KNEE ARTHROPLASTY;  Surgeon: Frederik Pear, MD;  Location: [facility];  Service: Orthopedics;  Laterality: Right;    There were no vitals filed for this visit.    Subjective Assessment - 09/30/21 0804     Subjective "My balance hasn't been too good for the last few months. I don't move quickly and I have to really think about my next step. I'm working now 3-4 days per week. I do a lot of reaching and bending  and it's becoming an issue." Pt can't get on a step ladder when he should be. Pt reports he was here before for vertigo. Pt states he thinks he still has it off and on. Pt reports he's been doing a few exercises at home that was shown to him from last episode.    Pertinent History h/o BPPV, OA, atrial fibrillation, dysrhythmia, GERD, glaucoma, hearing loss, h/o kidney stones, h/o stomach ulcers, h/o migraines, HTN, peripheral neuropathy, OSA, cataract extraction, R rotator cuff surgery, L THA    Patient Stated Goals to get vertigo under control before undergoing TKA    Currently in Pain? No/denies                [facility] PT Assessment - 09/30/21 0001       Assessment   Medical Diagnosis R42 (ICD-10-CM) - Vertigo    Referring Provider (PT) Koirala, Dibas, MD    Prior Therapy yes, orthopedic and vestibular      Precautions   Precautions Other (comment)      Restrictions   Weight Bearing Restrictions No      Balance Screen   Has the patient fallen in the past 6 months No      [place] residence    Living Arrangements Spouse/significant other    Additional Comments Driving  Prior Function   Level of Independence Independent    Vocation Part time employment    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      Observation/Other Assessments   Focus on Therapeutic Outcomes ([place])  27 (risk adjusted 49); predicted 57      Sensation   Light Touch Impaired Detail    Additional Comments N&T, burning, pins and needles in feet due to neuropathy      Ambulation/Gait   Ambulation Distance (Feet) --   Throughout gym   Assistive device None    Gait Pattern Step-through pattern;Decreased stride length;Trendelenburg;Lateral trunk lean to right;Lateral trunk lean to left;Wide base of support    Ambulation Surface Level;Indoor    Gait velocity 0.49 m/sec    Stairs Yes    Stairs Assistance 5: Supervision    Stair Management  Technique Two rails;Alternating pattern    Number of Stairs 4    Height of Stairs 6      High Level Balance   High Level Balance Comments mCTSIB score: 103.87/120. Condition 1: 30 sec, condition 2: 30 sec with increased sway, condition 3: 30 sec with increased sway, condition 4: 13.87 sec      Functional Gait  Assessment   Gait assessed  Yes    Gait Level Surface Walks 20 ft, slow speed, abnormal gait pattern, evidence for imbalance or deviates 10-15 in outside of the 12 in walkway width. Requires more than 7 sec to ambulate 20 ft.    Change in Gait Speed Makes only minor adjustments to walking speed, or accomplishes a change in speed with significant gait deviations, deviates 10-15 in outside the 12 in walkway width, or changes speed but loses balance but is able to recover and continue walking.    Gait with Horizontal Head Turns Performs head turns smoothly with slight change in gait velocity (eg, minor disruption to smooth gait path), deviates 6-10 in outside 12 in walkway width, or uses an assistive device.    Gait with Vertical Head Turns Performs task with moderate change in gait velocity, slows down, deviates 10-15 in outside 12 in walkway width but recovers, can continue to walk.    Gait and Pivot Turn Pivot turns safely in greater than 3 sec and stops with no loss of balance, or pivot turns safely within 3 sec and stops with mild imbalance, requires small steps to catch balance.    Step Over Obstacle Is able to step over one shoe box (4.5 in total height) without changing gait speed. No evidence of imbalance.    Gait with Narrow Base of Support Ambulates less than 4 steps heel to toe or cannot perform without assistance.    Gait with Eyes Closed Walks 20 ft, slow speed, abnormal gait pattern, evidence for imbalance, deviates 10-15 in outside 12 in walkway width. Requires more than 9 sec to ambulate 20 ft.    Ambulating Backwards Walks 20 ft, slow speed, abnormal gait pattern, evidence for  imbalance, deviates 10-15 in outside 12 in walkway width.    Steps Alternating feet, must use rail.    Total Score 13    FGA comment: 13/30                    Vestibular Assessment - 09/30/21 0001       Vestibular Assessment   General Observation Came for vestibular rehab in 2021      Symptom Behavior   Type of Dizziness  Unsteady with  head/body turns;Imbalance    Frequency of Dizziness intermittent    Duration of Dizziness seconds    Symptom Nature Positional;Motion provoked;Spontaneous    Aggravating Factors Rolling to right;Supine to sit    Relieving Factors Slow movements;Closing eyes    Progression of Symptoms Worse      Oculomotor Exam   Oculomotor Alignment Normal    Ocular ROM WNL    Spontaneous Absent    Gaze-induced  --   reports minimal symptoms looking up/left   Smooth Pursuits Intact    Saccades Intact      Oculomotor Exam-Fixation Suppressed    Left Head Impulse negative    Right Head Impulse "Felt a little slow"      Vestibulo-Ocular Reflex   VOR 1 Head Only (x 1 viewing) Mild symptoms    VOR to Slow Head Movement Normal    VOR Cancellation Normal      Positional Testing   Sidelying Test Sidelying Right;Sidelying Left      Dix-Hallpike Right   Dix-Hallpike Right Duration 0      Dix-Hallpike Left   Dix-Hallpike Left Duration 0      Sidelying Right   Sidelying Right Duration 0      Sidelying Left   Sidelying Left Duration 1    Sidelying Left Symptoms --   unable to visualize due to brevity of symptoms     Horizontal Canal Right   Horizontal Canal Right Duration 0      Horizontal Canal Left   Horizontal Canal Left Duration 0                Objective measurements completed on examination: See above findings.        Vestibular Treatment/Exercise - 09/30/21 0001       Vestibular Treatment/Exercise   Canalith Repositioning Semont Procedure Left Posterior      Semont Procedure Left Posterior   Number of Reps  2     Overall Response Improved Symptoms    Response Details  Mild improvement in symptoms; first rep pt performed more Nestor Lewandowsky                      PT Short Term Goals - 09/30/21 0852       PT SHORT TERM GOAL #1   Title Pt will demonstrate negative positional testing    Baseline L posterior canal BPPV    Time 3    Period Weeks    Status New    Target Date 10/21/21               PT Long Term Goals - 09/30/21 0849       PT LONG TERM GOAL #1   Title Pt will demonstrate independence with HEP    Time 6    Period Weeks    Status New    Target Date 10/21/21      PT LONG TERM GOAL #2   Title Pt will report no dizziness during any of his work tasks    Baseline Looking up at shelves can cause dizziness/imbalance    Time 6    Period Weeks    Status New    Target Date 10/21/21      PT LONG TERM GOAL #3   Title Pt will be able to maintain his balance for 30 sec in condition 4 of mCTSIB to demo improved vestibular integration    Baseline 13.87 sec    Time 6    Period  Weeks    Status New    Target Date 11/11/21      PT LONG TERM GOAL #4   Title Pt will have improved FGA score to at least 22/30 to demo decreased fall risk    Baseline 13/30    Time 3    Period Weeks    Status New    Target Date 10/21/21      PT LONG TERM GOAL #5   Title Pt will have increased FOTO score to at least 57    Baseline 39    Time 6    Period Weeks    Status New    Target Date 11/11/21                    Plan - 09/30/21 0845     Clinical Impression Statement [name] is a 81 y/o M presenting to OPPT due to c/o increased balance. Pt reports only mild symptoms of dizziness occasionally with head turns and looking up. Pt has had a history of BPPV in the past. On assessment, pt demos mild symptoms with L posterior canalith testing in sidelying (pt with reduced neck ROM to perform with enough extension in supine), increased fall risk based on his FGA and poor  vestibular integration in his balance during mCTSIB. Pt would highly benefit from PT to improve on these issues for safer mobility at home and at work.    Personal Factors and Comorbidities Comorbidity 3+;Fitness;Past/Current Experience    Comorbidities BPPV, fall, OA, atrial fibrillation, dysrhythmia, GERD, glaucoma, hearing loss, h/o kidney stones, h/o stomach ulcers, h/o migraines, HTN, peripheral neuropathy, OSA, cataract extraction, R rotator cuff surgery, L THA    Examination-Activity Limitations Bed Mobility;Bend;Stand;Locomotion Level;Transfers;Hygiene/Grooming    Examination-Participation Restrictions Community Activity;Occupation    Stability/Clinical Decision Making Stable/Uncomplicated    Clinical Decision Making Low    Rehab Potential Good    PT Frequency 2x / week    PT Duration 6 weeks    PT Treatment/Interventions ADLs/Self Care Home Management;Canalith Repostioning;Functional mobility training;Therapeutic activities;Therapeutic exercise;Balance training;Neuromuscular re-education;Patient/family education;Vestibular;Aquatic Therapy;DME Instruction;Gait training;Stair training;Dry needling;Passive range of motion;Manual techniques    PT Next Visit Plan Reassess for BPPV -- semont maneuver vs epley maneuver. Initiate balance exercises.    PT Home Exercise Plan Semont maneuver x 3 daily as needed    Consulted and Agree with Plan of Care Patient             Patient will benefit from skilled therapeutic intervention in order to improve the following deficits and impairments:  Decreased balance, Dizziness, Abnormal gait, Difficulty walking, Decreased mobility  Visit Diagnosis: BPPV (benign paroxysmal positional vertigo), right  Dizziness and giddiness  Unsteadiness on feet  Other abnormalities of gait and mobility     Problem List Patient Active Problem List   Diagnosis Date Noted   Total knee replacement status, right 12/17/2019   Osteoarthritis of right knee  12/13/2019   Essential hypertension 12/04/2018   Sleep apnea 12/04/2018   Chronic anticoagulation 12/04/2018   IgM lambda monoclonal gammopathy 01/25/2018   Peripheral neuropathy 12/26/2017   Primary osteoarthritis of left hip 10/25/2015   Atrial fibrillation, chronic 08/26/2015    [facility] April Gordy Levan, PT, DPT 09/30/2021, 8:53 AM  [facility] [address] [place], [place], [address] Phone: [phone]   Fax:  [phone]  Name: Glen Bolton MRN: [id number] Date of Birth: Dec 24, 1940

## 2021-10-05 ENCOUNTER — Ambulatory Visit: Payer: Medicare Other | Admitting: Physical Therapy

## 2021-10-05 ENCOUNTER — Other Ambulatory Visit: Payer: Self-pay

## 2021-10-05 DIAGNOSIS — R2681 Unsteadiness on feet: Secondary | ICD-10-CM | POA: Diagnosis not present

## 2021-10-05 DIAGNOSIS — R2689 Other abnormalities of gait and mobility: Secondary | ICD-10-CM | POA: Diagnosis not present

## 2021-10-05 DIAGNOSIS — H8111 Benign paroxysmal vertigo, right ear: Secondary | ICD-10-CM

## 2021-10-05 DIAGNOSIS — R42 Dizziness and giddiness: Secondary | ICD-10-CM | POA: Diagnosis not present

## 2021-10-05 NOTE — Therapy (Signed)
[facility] [address] [place], [place], [address] Phone: [phone]   Fax:  [phone]  Physical Therapy Treatment  Patient Details  Name: Glen Bolton MRN: [id number] Date of Birth: June 28, 1941 Referring Provider (PT): Lujean Amel, MD   Encounter Date: 10/05/2021   PT End of Session - 10/05/21 1703     Visit Number 2    Number of Visits 12    Date for PT Re-Evaluation 11/11/21    Authorization Type U[employer] Medicare    Progress Note Due on Visit 10    PT Start Time 1618    PT Stop Time 1700    PT Time Calculation (min) 42 min    Activity Tolerance Patient tolerated treatment well    Behavior During Therapy [facility] for tasks assessed/performed             Past Medical History:  Diagnosis Date   Anxiety    Arthritis    Atrial fibrillation, chronic 08/26/2015   Borderline diabetic    Chronic anticoagulation 12/04/2018   CHADS VASC-3-Xarelto   GERD (gastroesophageal reflux disease)    Glaucoma    Hard of hearing    History of gastritis    History of kidney stones    History of stomach ulcers    Hx of migraines    no migraines since he was in his 75's   Hypertension    Osteoarthritis of right knee 12/13/2019   Peripheral neuropathy 12/26/2017   Primary osteoarthritis of left hip 10/25/2015   Sleep apnea    CPAP  not currently working  couldnt breathe with it laying down trying something different end of  April or May 2021    Past Surgical History:  Procedure Laterality Date   CATARACT EXTRACTION Bilateral 2014   COLONOSCOPY     ESOPHAGOGASTRODUODENOSCOPY     EYE SURGERY     INGUINAL HERNIA REPAIR Left    x 2   Rotator cuff surgery Right 2013   TOTAL HIP ARTHROPLASTY Left 10/27/2015   Procedure: TOTAL HIP ARTHROPLASTY ANTERIOR APPROACH;  Surgeon: Frederik Pear, MD;  Location: [facility];  Service: Orthopedics;  Laterality: Left;   TOTAL KNEE ARTHROPLASTY Right 12/17/2019   Procedure: RIGHT TOTAL KNEE  ARTHROPLASTY;  Surgeon: Frederik Pear, MD;  Location: [facility];  Service: Orthopedics;  Laterality: Right;    There were no vitals filed for this visit.   Subjective Assessment - 10/05/21 1622     Subjective Pt felt better after first session but had a few spells over the weekend.  One time pt was looking down to put in hearing aides and lost his balance backwards.  One time he was bending down to pet cat and fell to the side - coffee table stopped him from falling.  Has been doing exercises, having slight symptoms.    Pertinent History h/o BPPV, OA, atrial fibrillation, dysrhythmia, GERD, glaucoma, hearing loss, h/o kidney stones, h/o stomach ulcers, h/o migraines, HTN, peripheral neuropathy, OSA, cataract extraction, R rotator cuff surgery, L THA    Patient Stated Goals To stop falls    Currently in Pain? No/denies               Vestibular Assessment - 10/05/21 1625       Positional Testing   Dix-Hallpike Dix-Hallpike Right      Dix-Hallpike Right   Dix-Hallpike Right Duration 0    Dix-Hallpike Right Symptoms No nystagmus      Dix-Hallpike Left   Dix-Hallpike Left Duration  0    Dix-Hallpike Left Symptoms No nystagmus      Sidelying Right   Sidelying Right Duration 0    Sidelying Right Symptoms No nystagmus      Sidelying Left   Sidelying Left Duration 0    Sidelying Left Symptoms No nystagmus      Horizontal Canal Right   Horizontal Canal Right Duration 0    Horizontal Canal Right Symptoms Normal      Horizontal Canal Left   Horizontal Canal Left Duration 0    Horizontal Canal Left Symptoms Normal      Positional Sensitivities   Sit to Supine No dizziness    Supine to Left Side No dizziness    Supine to Right Side No dizziness    Supine to Sitting Mild dizziness    Right Hallpike No dizziness    Up from Right Hallpike Mild dizziness    Up from Left Hallpike Mild dizziness    Nose to Right Knee No dizziness    Right Knee to Sitting No dizziness    Nose to Left  Knee No dizziness    Left Knee to Sitting No dizziness    Head Turning x 5 No dizziness   in standing pt reports disequilibrium   Head Nodding x 5 No dizziness   in standing pt reports disequilibrium   Pivot Right in Standing No dizziness    Pivot Left in Standing No dizziness    Rolling Right No dizziness    Rolling Left No dizziness            Performed the following exercises to initiate HEP focusing on LE strength, balance reaction training and VOR training:  Access Code: [email or web address] URL: [email or web address] Date: 10/05/2021 Prepared by: Misty Stanley  Exercises Heel Toe Raises with Counter Support - 1 x daily - 7 x weekly - 2 sets - 10 reps Standing Hip Abduction with Counter Support - 1 x daily - 7 x weekly - 2 sets - 10 reps Sit to Stand - 1 x daily - 7 x weekly - 2 sets - 10 reps Eyes Stable - Head says "No" - 1 x daily - 7 x weekly - 2 sets - 60 seconds hold Eyes Stable - Head says "Yes" - 1 x daily - 7 x weekly - 2 sets - 60 seconds hold    PT Education - 10/05/21 1703     Education Details BPPV has resolved; ongoing disequilibrium appears to be related to balance; initiated HEP    Person(s) Educated Patient    Methods Explanation    Comprehension Verbalized understanding              PT Short Term Goals - 09/30/21 0852       PT SHORT TERM GOAL #1   Title Pt will demonstrate negative positional testing    Baseline L posterior canal BPPV    Time 3    Period Weeks    Status New    Target Date 10/21/21               PT Long Term Goals - 09/30/21 0849       PT LONG TERM GOAL #1   Title Pt will demonstrate independence with HEP    Time 6    Period Weeks    Status New    Target Date 10/21/21      PT LONG TERM GOAL #2   Title Pt will report no dizziness during any of his work  tasks    Baseline Looking up at shelves can cause dizziness/imbalance    Time 6    Period Weeks    Status New    Target Date 10/21/21      PT LONG TERM  GOAL #3   Title Pt will be able to maintain his balance for 30 sec in condition 4 of mCTSIB to demo improved vestibular integration    Baseline 13.87 sec    Time 6    Period Weeks    Status New    Target Date 11/11/21      PT LONG TERM GOAL #4   Title Pt will have improved FGA score to at least 22/30 to demo decreased fall risk    Baseline 13/30    Time 3    Period Weeks    Status New    Target Date 10/21/21      PT LONG TERM GOAL #5   Title Pt will have increased FOTO score to at least 57    Baseline 39    Time 6    Period Weeks    Status New    Target Date 11/11/21                   Plan - 10/05/21 1705     Clinical Impression Statement Pt demonstrates full resolution of BPPV today.  Discontinued Brandt-Daroff for HEP for now and initiated HEP focusing on LE strengthening, balance reaction training and VOR training.  Will continue to progress in order to address ongoing disequilibrium.    Personal Factors and Comorbidities Comorbidity 3+;Fitness;Past/Current Experience    Comorbidities BPPV, fall, OA, atrial fibrillation, dysrhythmia, GERD, glaucoma, hearing loss, h/o kidney stones, h/o stomach ulcers, h/o migraines, HTN, peripheral neuropathy, OSA, cataract extraction, R rotator cuff surgery, L THA    Examination-Activity Limitations Bed Mobility;Bend;Stand;Locomotion Level;Transfers;Hygiene/Grooming    Examination-Participation Restrictions Community Activity;Occupation    Stability/Clinical Decision Making Stable/Uncomplicated    Rehab Potential Good    PT Frequency 2x / week    PT Duration 6 weeks    PT Treatment/Interventions ADLs/Self Care Home Management;Canalith Repostioning;Functional mobility training;Therapeutic activities;Therapeutic exercise;Balance training;Neuromuscular re-education;Patient/family education;Vestibular;Aquatic Therapy;DME Instruction;Gait training;Stair training;Dry needling;Passive range of motion;Manual techniques    PT Next Visit Plan  Continue to work on static and dynamic balance, balance reactions, weight shifting and recovery when reaching out of BOS.  Corner balance with head movements    PT Home Exercise Plan --    Consulted and Agree with Plan of Care Patient             Patient will benefit from skilled therapeutic intervention in order to improve the following deficits and impairments:  Decreased balance, Dizziness, Abnormal gait, Difficulty walking, Decreased mobility  Visit Diagnosis: BPPV (benign paroxysmal positional vertigo), right  Dizziness and giddiness  Unsteadiness on feet  Other abnormalities of gait and mobility     Problem List Patient Active Problem List   Diagnosis Date Noted   Total knee replacement status, right 12/17/2019   Osteoarthritis of right knee 12/13/2019   Essential hypertension 12/04/2018   Sleep apnea 12/04/2018   Chronic anticoagulation 12/04/2018   IgM lambda monoclonal gammopathy 01/25/2018   Peripheral neuropathy 12/26/2017   Primary osteoarthritis of left hip 10/25/2015   Atrial fibrillation, chronic 08/26/2015    Rico Junker, PT, DPT 10/05/21    5:18 PM    [facility] [address] [place], [place], [address] Phone: [phone]   Fax:  [phone]  Name: Glen Bolton MRN: [id number] Date of Birth: [birth year]

## 2021-10-05 NOTE — Patient Instructions (Signed)
Access Code: [email or web address] URL: [email or web address] Date: 10/05/2021 Prepared by: Misty Stanley  Exercises Heel Toe Raises with Counter Support - 1 x daily - 7 x weekly - 2 sets - 10 reps Standing Hip Abduction with Counter Support - 1 x daily - 7 x weekly - 2 sets - 10 reps Sit to Stand - 1 x daily - 7 x weekly - 2 sets - 10 reps Eyes Stable - Head says "No" - 1 x daily - 7 x weekly - 2 sets - 60 seconds hold Eyes Stable - Head says "Yes" - 1 x daily - 7 x weekly - 2 sets - 60 seconds hold

## 2021-10-07 ENCOUNTER — Ambulatory Visit: Payer: Medicare Other | Attending: Family Medicine | Admitting: Physical Therapy

## 2021-10-07 ENCOUNTER — Other Ambulatory Visit: Payer: Self-pay

## 2021-10-07 DIAGNOSIS — H8111 Benign paroxysmal vertigo, right ear: Secondary | ICD-10-CM | POA: Diagnosis not present

## 2021-10-07 DIAGNOSIS — R2689 Other abnormalities of gait and mobility: Secondary | ICD-10-CM | POA: Diagnosis not present

## 2021-10-07 DIAGNOSIS — R2681 Unsteadiness on feet: Secondary | ICD-10-CM | POA: Diagnosis not present

## 2021-10-07 DIAGNOSIS — R42 Dizziness and giddiness: Secondary | ICD-10-CM | POA: Diagnosis not present

## 2021-10-07 NOTE — Therapy (Signed)
[facility] [address] [place], [place], [address] Phone: [phone]   Fax:  [phone]  Physical Therapy Treatment  Patient Details  Name: Glen Bolton MRN: [id number] Date of Birth: 05-04-1941 Referring Provider (PT): Lujean Amel, MD   Encounter Date: 10/07/2021   PT End of Session - 10/07/21 0845     Visit Number 3    Number of Visits 12    Date for PT Re-Evaluation 11/11/21    Authorization Type [employer] Medicare    Progress Note Due on Visit 10    PT Start Time 0801    PT Stop Time 0843    PT Time Calculation (min) 42 min    Activity Tolerance Patient tolerated treatment well    Behavior During Therapy [facility] for tasks assessed/performed             Past Medical History:  Diagnosis Date   Anxiety    Arthritis    Atrial fibrillation, chronic 08/26/2015   Borderline diabetic    Chronic anticoagulation 12/04/2018   CHADS VASC-3-Xarelto   GERD (gastroesophageal reflux disease)    Glaucoma    Hard of hearing    History of gastritis    History of kidney stones    History of stomach ulcers    Hx of migraines    no migraines since he was in his 38's   Hypertension    Osteoarthritis of right knee 12/13/2019   Peripheral neuropathy 12/26/2017   Primary osteoarthritis of left hip 10/25/2015   Sleep apnea    CPAP  not currently working  couldnt breathe with it laying down trying something different end of  April or May 2021    Past Surgical History:  Procedure Laterality Date   CATARACT EXTRACTION Bilateral 2014   COLONOSCOPY     ESOPHAGOGASTRODUODENOSCOPY     EYE SURGERY     INGUINAL HERNIA REPAIR Left    x 2   Rotator cuff surgery Right 2013   TOTAL HIP ARTHROPLASTY Left 10/27/2015   Procedure: TOTAL HIP ARTHROPLASTY ANTERIOR APPROACH;  Surgeon: Frederik Pear, MD;  Location: [facility];  Service: Orthopedics;  Laterality: Left;   TOTAL KNEE ARTHROPLASTY Right 12/17/2019   Procedure: RIGHT TOTAL KNEE  ARTHROPLASTY;  Surgeon: Frederik Pear, MD;  Location[facility];  Service: Orthopedics;  Laterality: Right;    There were no vitals filed for this visit.   Subjective Assessment - 10/07/21 0803     Subjective "I haven't been bad but I'm not great. My balance is still not the best."    Pertinent History h/o BPPV, OA, atrial fibrillation, dysrhythmia, GERD, glaucoma, hearing loss, h/o kidney stones, h/o stomach ulcers, h/o migraines, HTN, peripheral neuropathy, OSA, cataract extraction, R rotator cuff surgery, L THA    Patient Stated Goals To stop falls                               [facility] Adult PT Treatment/Exercise - 10/07/21 0001       Knee/Hip Exercises: Standing   Heel Raises 2 sets;10 reps    Heel Raises Limitations toe/heel raise    Hip Flexion Right;Left;2 sets;10 reps;Knee bent    Hip Flexion Limitations slow    Hip Abduction Right;Left;2 sets;10 reps;Knee straight    Hip Extension Right;Left;2 sets;10 reps;Knee straight      Knee/Hip Exercises: Seated   Sit to Sand 2 sets;10 reps;without UE support  Vestibular Treatment/Exercise - 10/07/21 0001       Vestibular Treatment/Exercise   Gaze Exercises Eye/Head Exercise Horizontal;Eye/Head Exercise Vertical      X1 Viewing Horizontal   Foot Position Seated and then standing feet together    Reps 2    Comments 60 sec      X1 Viewing Vertical   Foot Position standing feet together    Reps 2    Comments 60 sec      Eye/Head Exercise Horizontal   Foot Position Standing feet apart    Reps 1    Comments 60 sec saccades & smooth pursuit      Eye/Head Exercise Vertical   Foot Position Standing feet apart    Reps 1    Comments 60 sec saccades and smooth pursuit                Balance Exercises - 10/07/21 0001       Balance Exercises: Standing   Standing Eyes Opened Narrow base of support (BOS);Foam/compliant surface;2 reps;30 secs;Head turns    Standing Eyes Opened Limitations  2x30 sec statically, 2x30 sec head turns and then head nods    Standing Eyes Closed Narrow base of support (BOS);Foam/compliant surface;2 reps;20 secs    Other Standing Exercises Lateral side stepping over 3 foam obstacles x2 reps each direction next to counter    Other Standing Exercises Comments Forward stepping over 3 foam obstacles                  PT Short Term Goals - 10/07/21 0845       PT SHORT TERM GOAL #1   Title Pt will demonstrate negative positional testing    Baseline L posterior canal BPPV    Time 3    Period Weeks    Status Achieved    Target Date 10/21/21               PT Long Term Goals - 09/30/21 0849       PT LONG TERM GOAL #1   Title Pt will demonstrate independence with HEP    Time 6    Period Weeks    Status New    Target Date 10/21/21      PT LONG TERM GOAL #2   Title Pt will report no dizziness during any of his work tasks    Baseline Looking up at shelves can cause dizziness/imbalance    Time 6    Period Weeks    Status New    Target Date 10/21/21      PT LONG TERM GOAL #3   Title Pt will be able to maintain his balance for 30 sec in condition 4 of mCTSIB to demo improved vestibular integration    Baseline 13.87 sec    Time 6    Period Weeks    Status New    Target Date 11/11/21      PT LONG TERM GOAL #4   Title Pt will have improved FGA score to at least 22/30 to demo decreased fall risk    Baseline 13/30    Time 3    Period Weeks    Status New    Target Date 10/21/21      PT LONG TERM GOAL #5   Title Pt will have increased FOTO score to at least 57    Baseline 39    Time 6    Period Weeks    Status New    Target Date  11/11/21                   Plan - 10/07/21 0759     Clinical Impression Statement Reviewed HEP and modified/progress as needed. Initiated balance exercises -- worked primarily on stepping strategies/negotiating obstacles. Worked on gaze stabilization and balance on foam. Highly challenged  when on foam with eyes closed.    Personal Factors and Comorbidities Comorbidity 3+;Fitness;Past/Current Experience    Comorbidities BPPV, fall, OA, atrial fibrillation, dysrhythmia, GERD, glaucoma, hearing loss, h/o kidney stones, h/o stomach ulcers, h/o migraines, HTN, peripheral neuropathy, OSA, cataract extraction, R rotator cuff surgery, L THA    Examination-Activity Limitations Bed Mobility;Bend;Stand;Locomotion Level;Transfers;Hygiene/Grooming    Examination-Participation Restrictions Community Activity;Occupation    Stability/Clinical Decision Making Stable/Uncomplicated    Rehab Potential Good    PT Frequency 2x / week    PT Duration 6 weeks    PT Treatment/Interventions ADLs/Self Care Home Management;Canalith Repostioning;Functional mobility training;Therapeutic activities;Therapeutic exercise;Balance training;Neuromuscular re-education;Patient/family education;Vestibular;Aquatic Therapy;DME Instruction;Gait training;Stair training;Dry needling;Passive range of motion;Manual techniques    PT Next Visit Plan Continue to work on static and dynamic balance, balance reactions, weight shifting and recovery when reaching out of BOS.  Corner balance with head movements    PT Home Exercise Plan Semont maneuver x 3 daily as needed    Consulted and Agree with Plan of Care Patient             Patient will benefit from skilled therapeutic intervention in order to improve the following deficits and impairments:  Decreased balance, Dizziness, Abnormal gait, Difficulty walking, Decreased mobility  Visit Diagnosis: BPPV (benign paroxysmal positional vertigo), right  Dizziness and giddiness  Unsteadiness on feet  Other abnormalities of gait and mobility     Problem List Patient Active Problem List   Diagnosis Date Noted   Total knee replacement status, right 12/17/2019   Osteoarthritis of right knee 12/13/2019   Essential hypertension 12/04/2018   Sleep apnea 12/04/2018   Chronic  anticoagulation 12/04/2018   IgM lambda monoclonal gammopathy 01/25/2018   Peripheral neuropathy 12/26/2017   Primary osteoarthritis of left hip 10/25/2015   Atrial fibrillation, chronic 08/26/2015    [facility] April Gordy Levan, PT, DPT 10/07/2021, 8:46 AM  [facility] [address] [place], [place], [address] Phone: [phone]   Fax:  [phone]  Name: Glen Bolton MRN: [id number] Date of Birth: 1940-09-20

## 2021-10-13 ENCOUNTER — Ambulatory Visit: Payer: Medicare Other | Admitting: Physical Therapy

## 2021-10-13 ENCOUNTER — Other Ambulatory Visit: Payer: Self-pay

## 2021-10-13 DIAGNOSIS — H8111 Benign paroxysmal vertigo, right ear: Secondary | ICD-10-CM

## 2021-10-13 DIAGNOSIS — R2689 Other abnormalities of gait and mobility: Secondary | ICD-10-CM | POA: Diagnosis not present

## 2021-10-13 DIAGNOSIS — R2681 Unsteadiness on feet: Secondary | ICD-10-CM | POA: Diagnosis not present

## 2021-10-13 DIAGNOSIS — R42 Dizziness and giddiness: Secondary | ICD-10-CM

## 2021-10-13 NOTE — Patient Instructions (Signed)
Access Code: [email or web address] URL: [email or web address] Date: 10/13/2021 Prepared by: Estill Bamberg [name]  Exercises Heel Toe Raises with Counter Support - 1 x daily - 7 x weekly - 2 sets - 10 reps Sit to Stand - 1 x daily - 7 x weekly - 2 sets - 10 reps Side Stepping with Resistance at Ankles and Counter Support - 1 x daily - 7 x weekly - 2 sets - 10 reps Forward and Backward Monster Walk with Resistance at Ankles and Counter Support - 1 x daily - 7 x weekly - 2 sets - 10 reps Romberg Stance on Foam Pad with Head Rotation - 1 x daily - 7 x weekly - 2 sets - 30 sec hold Romberg Stance with Head Nods on Foam Pad - 1 x daily - 7 x weekly - 2 sets - 30 sec hold Romberg Stance Eyes Closed on Foam Pad - 1 x daily - 7 x weekly - 2 sets - 30 sec hold Standing Romberg to 1/2 Tandem Stance - 1 x daily - 7 x weekly - 2 sets - 20 sec hold

## 2021-10-13 NOTE — Therapy (Signed)
[facility] [address] [place], [place], [address] Phone: [phone]   Fax:  [phone]  Physical Therapy Treatment  Patient Details  Name: Glen Bolton MRN: [id number] Date of Birth: Nov 15, 1940 Referring Provider (PT): Lujean Amel, MD   Encounter Date: 10/13/2021   PT End of Session - 10/13/21 0841     Visit Number 4    Number of Visits 12    Date for PT Re-Evaluation 11/11/21    Authorization Type U[employer] Medicare    Progress Note Due on Visit 10    PT Start Time 0845    PT Stop Time 0930    PT Time Calculation (min) 45 min    Activity Tolerance Patient tolerated treatment well    Behavior During Therapy [facility] for tasks assessed/performed             Past Medical History:  Diagnosis Date   Anxiety    Arthritis    Atrial fibrillation, chronic 08/26/2015   Borderline diabetic    Chronic anticoagulation 12/04/2018   CHADS VASC-3-Xarelto   GERD (gastroesophageal reflux disease)    Glaucoma    Hard of hearing    History of gastritis    History of kidney stones    History of stomach ulcers    Hx of migraines    no migraines since he was in his 72's   Hypertension    Osteoarthritis of right knee 12/13/2019   Peripheral neuropathy 12/26/2017   Primary osteoarthritis of left hip 10/25/2015   Sleep apnea    CPAP  not currently working  couldnt breathe with it laying down trying something different end of  April or May 2021    Past Surgical History:  Procedure Laterality Date   CATARACT EXTRACTION Bilateral 2014   COLONOSCOPY     ESOPHAGOGASTRODUODENOSCOPY     EYE SURGERY     INGUINAL HERNIA REPAIR Left    x 2   Rotator cuff surgery Right 2013   TOTAL HIP ARTHROPLASTY Left 10/27/2015   Procedure: TOTAL HIP ARTHROPLASTY ANTERIOR APPROACH;  Surgeon: Frederik Pear, MD;  Location: [facility];  Service: Orthopedics;  Laterality: Left;   TOTAL KNEE ARTHROPLASTY Right 12/17/2019   Procedure: RIGHT TOTAL KNEE  ARTHROPLASTY;  Surgeon: Frederik Pear, MD;  Location: [facility];  Service: Orthopedics;  Laterality: Right;    There were no vitals filed for this visit.   Subjective Assessment - 10/13/21 0846     Subjective Pt reports dizziness has been okay just the balance. Has been doing his exercises daily.    Pertinent History h/o BPPV, OA, atrial fibrillation, dysrhythmia, GERD, glaucoma, hearing loss, h/o kidney stones, h/o stomach ulcers, h/o migraines, HTN, peripheral neuropathy, OSA, cataract extraction, R rotator cuff surgery, L THA    Patient Stated Goals To stop falls                [facility] PT Assessment - 10/13/21 0001       Assessment   Medical Diagnosis R42 (ICD-10-CM) - Vertigo    Referring Provider (PT) Koirala, Dibas, MD    Prior Therapy yes, orthopedic and vestibular                           [facility] Adult PT Treatment/Exercise - 10/13/21 0001       Knee/Hip Exercises: Standing   Heel Raises 2 sets;10 reps    Heel Raises Limitations toe/heel raise    Other Standing Knee Exercises  Lateral stepping red tband x4 reps by counter    Other Standing Knee Exercises Backwards and forwards monster walk red tband x4 reps by counter                 Balance Exercises - 10/13/21 0001       Balance Exercises: Standing   Standing Eyes Opened Narrow base of support (BOS);Foam/compliant surface;2 reps;30 secs;Head turns   on pillows   Standing Eyes Opened Limitations 2x30 sec statically, 2x30 sec head turns and then head nods    Standing Eyes Closed Narrow base of support (BOS);Foam/compliant surface;2 reps;30 secs    Standing Eyes Closed Limitations static 2x30 sec; 2x10 alternating UE raises    SLS with Vectors Solid surface;Intermittent upper extremity assist    SLS with Vectors Limitations 2x10 tap forward and laterally each direction on to therastone    Gait with Head Turns Forward;2 reps   x30'   Gait with Head Turns Limitations head turns and head nods 2x30'     Partial Tandem Stance Eyes open;Intermittent upper extremity support;2 reps;20 secs                  PT Short Term Goals - 10/07/21 0845       PT SHORT TERM GOAL #1   Title Pt will demonstrate negative positional testing    Baseline L posterior canal BPPV    Time 3    Period Weeks    Status Achieved    Target Date 10/21/21               PT Long Term Goals - 09/30/21 0849       PT LONG TERM GOAL #1   Title Pt will demonstrate independence with HEP    Time 6    Period Weeks    Status New    Target Date 10/21/21      PT LONG TERM GOAL #2   Title Pt will report no dizziness during any of his work tasks    Baseline Looking up at shelves can cause dizziness/imbalance    Time 6    Period Weeks    Status New    Target Date 10/21/21      PT LONG TERM GOAL #3   Title Pt will be able to maintain his balance for 30 sec in condition 4 of mCTSIB to demo improved vestibular integration    Baseline 13.87 sec    Time 6    Period Weeks    Status New    Target Date 11/11/21      PT LONG TERM GOAL #4   Title Pt will have improved FGA score to at least 22/30 to demo decreased fall risk    Baseline 13/30    Time 3    Period Weeks    Status New    Target Date 10/21/21      PT LONG TERM GOAL #5   Title Pt will have increased FOTO score to at least 57    Baseline 39    Time 6    Period Weeks    Status New    Target Date 11/11/21                   Plan - 10/13/21 0914     Clinical Impression Statement Progressed pt's HEP. Continued to work on strengthening and balance. Pt with improving stability on compliant surface with eyes closed. Focused primarily on weight shifting. Pt has difficulty maintaining weight  shift on R LE.    Personal Factors and Comorbidities Comorbidity 3+;Fitness;Past/Current Experience    Comorbidities BPPV, fall, OA, atrial fibrillation, dysrhythmia, GERD, glaucoma, hearing loss, h/o kidney stones, h/o stomach ulcers, h/o  migraines, HTN, peripheral neuropathy, OSA, cataract extraction, R rotator cuff surgery, L THA    Examination-Activity Limitations Bed Mobility;Bend;Stand;Locomotion Level;Transfers;Hygiene/Grooming    Examination-Participation Restrictions Community Activity;Occupation    Stability/Clinical Decision Making Stable/Uncomplicated    Rehab Potential Good    PT Frequency 2x / week    PT Duration 6 weeks    PT Treatment/Interventions ADLs/Self Care Home Management;Canalith Repostioning;Functional mobility training;Therapeutic activities;Therapeutic exercise;Balance training;Neuromuscular re-education;Patient/family education;Vestibular;Aquatic Therapy;DME Instruction;Gait training;Stair training;Dry needling;Passive range of motion;Manual techniques    PT Next Visit Plan Continue to work on static and dynamic balance, balance reactions, weight shifting and recovery.  Corner balance with head movements    PT Home Exercise Plan [id number]             Patient will benefit from skilled therapeutic intervention in order to improve the following deficits and impairments:  Decreased balance, Dizziness, Abnormal gait, Difficulty walking, Decreased mobility  Visit Diagnosis: BPPV (benign paroxysmal positional vertigo), right  Dizziness and giddiness  Unsteadiness on feet  Other abnormalities of gait and mobility     Problem List Patient Active Problem List   Diagnosis Date Noted   Total knee replacement status, right 12/17/2019   Osteoarthritis of right knee 12/13/2019   Essential hypertension 12/04/2018   Sleep apnea 12/04/2018   Chronic anticoagulation 12/04/2018   IgM lambda monoclonal gammopathy 01/25/2018   Peripheral neuropathy 12/26/2017   Primary osteoarthritis of left hip 10/25/2015   Atrial fibrillation, chronic 08/26/2015    Beyonce Sawatzky April Gordy Levan, PT, DPT 10/13/2021, 9:30 AM  [facility] [address] [place], [place], [address] Phone: [phone]   Fax:  [phone]  Name: Glen Bolton MRN: [id number] Date of Birth: [birth year]

## 2021-10-16 ENCOUNTER — Ambulatory Visit: Payer: Medicare Other | Admitting: Physical Therapy

## 2021-10-16 ENCOUNTER — Other Ambulatory Visit: Payer: Self-pay

## 2021-10-16 DIAGNOSIS — R42 Dizziness and giddiness: Secondary | ICD-10-CM | POA: Diagnosis not present

## 2021-10-16 DIAGNOSIS — R2681 Unsteadiness on feet: Secondary | ICD-10-CM

## 2021-10-16 DIAGNOSIS — R2689 Other abnormalities of gait and mobility: Secondary | ICD-10-CM | POA: Diagnosis not present

## 2021-10-16 DIAGNOSIS — H8111 Benign paroxysmal vertigo, right ear: Secondary | ICD-10-CM | POA: Diagnosis not present

## 2021-10-16 NOTE — Therapy (Signed)
[facility] [address] [place], [place], [address] Phone: [phone]   Fax:  [phone]  Physical Therapy Treatment  Patient Details  Name: Glen Bolton MRN: [id number] Date of Birth: Jun 27, 1941 Referring Provider (PT): Lujean Amel, MD   Encounter Date: 10/16/2021   PT End of Session - 10/16/21 0934     Visit Number 5    Number of Visits 12    Date for PT Re-Evaluation 11/11/21    Authorization Type UHC Medicare    Progress Note Due on Visit 10    PT Start Time [phone]    PT Stop Time 0930    PT Time Calculation (min) 39 min    Activity Tolerance Patient tolerated treatment well    Behavior During Therapy [facility] for tasks assessed/performed             Past Medical History:  Diagnosis Date   Anxiety    Arthritis    Atrial fibrillation, chronic 08/26/2015   Borderline diabetic    Chronic anticoagulation 12/04/2018   CHADS VASC-3-Xarelto   GERD (gastroesophageal reflux disease)    Glaucoma    Hard of hearing    History of gastritis    History of kidney stones    History of stomach ulcers    Hx of migraines    no migraines since he was in his 44's   Hypertension    Osteoarthritis of right knee 12/13/2019   Peripheral neuropathy 12/26/2017   Primary osteoarthritis of left hip 10/25/2015   Sleep apnea    CPAP  not currently working  couldnt breathe with it laying down trying something different end of  April or May 2021    Past Surgical History:  Procedure Laterality Date   CATARACT EXTRACTION Bilateral 2014   COLONOSCOPY     ESOPHAGOGASTRODUODENOSCOPY     EYE SURGERY     INGUINAL HERNIA REPAIR Left    x 2   Rotator cuff surgery Right 2013   TOTAL HIP ARTHROPLASTY Left 10/27/2015   Procedure: TOTAL HIP ARTHROPLASTY ANTERIOR APPROACH;  Surgeon: Frederik Pear, MD;  Location: [facility];  Service: Orthopedics;  Laterality: Left;   TOTAL KNEE ARTHROPLASTY Right 12/17/2019   Procedure: RIGHT TOTAL KNEE  ARTHROPLASTY;  Surgeon: Frederik Pear, MD;  Location[facility];  Service: Orthopedics;  Laterality: Right;    There were no vitals filed for this visit.   Subjective Assessment - 10/16/21 0855     Subjective Exercises are going well.  Feels like he isn't seeing much change in his balance.  Still no return of vertigo.  At work he has to use a step ladder but has a bar to hang on to for balance; looking up is still bothersome.    Pertinent History h/o BPPV, OA, atrial fibrillation, dysrhythmia, GERD, glaucoma, hearing loss, h/o kidney stones, h/o stomach ulcers, h/o migraines, HTN, peripheral neuropathy, OSA, cataract extraction, R rotator cuff surgery, L THA    Patient Stated Goals To stop falls    Currently in Pain? No/denies               [facility] Adult PT Treatment/Exercise - 10/16/21 0858       Knee/Hip Exercises: Standing   Forward Step Up Right;Left;3 sets;10 reps    Forward Step Up Limitations bilat UE support on counter; alternating LE, first set gaze forwards, second set alternating head turns to L and R, third set looking up with step up.  Also performed alternating step ups on compliant  foam with bilat UE support x 12 reps with contralateral LE lift to SLS.  Second set performed looking up with ipsilateral UE reaching up, alternating R and L step up on compliant surface with one UE support x 10 reps.               Balance Exercises - 10/16/21 0916       Balance Exercises: Standing   Tandem Gait Forward;Upper extremity support;Other reps (comment);Limitations    Tandem Gait Limitations light UE support on counter, performed 4 laps tandem, 2 laps with horizontal head turns, 2 laps with vertical head turns and min A to keep weight over BOS                PT Education - 10/16/21 0932     Education Details no change to HEP today; discussed with pt timeline for improving balance and how it is a continual process of challenging balance to improve and maintain; also  educated pt on the importance of continuing exercise or balance training even once pt has D/C from therapy    Person(s) Educated Patient    Methods Explanation    Comprehension Verbalized understanding              PT Short Term Goals - 10/07/21 0845       PT SHORT TERM GOAL #1   Title Pt will demonstrate negative positional testing    Baseline L posterior canal BPPV    Time 3    Period Weeks    Status Achieved    Target Date 10/21/21               PT Long Term Goals - 10/16/21 0856       PT LONG TERM GOAL #1   Title Pt will demonstrate independence with HEP    Time 6    Period Weeks    Status New    Target Date 11/11/21      PT LONG TERM GOAL #2   Title Pt will report no dizziness during any of his work tasks    Baseline Looking up at shelves can cause dizziness/imbalance    Time 6    Period Weeks    Status New    Target Date 11/11/21      PT LONG TERM GOAL #3   Title Pt will be able to maintain his balance for 30 sec in condition 4 of mCTSIB to demo improved vestibular integration    Baseline 13.87 sec    Time 6    Period Weeks    Status New    Target Date 11/11/21      PT LONG TERM GOAL #4   Title Pt will have improved FGA score to at least 22/30 to demo decreased fall risk    Baseline 13/30    Time 3    Period Weeks    Status New    Target Date 11/11/21      PT LONG TERM GOAL #5   Title Pt will have increased FOTO score to at least 75    Baseline 39    Time 6    Period Weeks    Status New    Target Date 11/11/21                   Plan - 10/16/21 0934     Clinical Impression Statement Pt demonstrates good progress and has met STG.  Pt demonstrates full resolution of BPPV but due to  neuropathy, continues to demonstrate standing balance impairments.  Continued to address with functional stepping and reaching activities to simulate work activities.  Will continue to address in order to progress towards LTG.    Personal Factors and  Comorbidities Comorbidity 3+;Fitness;Past/Current Experience    Comorbidities BPPV, fall, OA, atrial fibrillation, dysrhythmia, GERD, glaucoma, hearing loss, h/o kidney stones, h/o stomach ulcers, h/o migraines, HTN, peripheral neuropathy, OSA, cataract extraction, R rotator cuff surgery, L THA    Examination-Activity Limitations Bed Mobility;Bend;Stand;Locomotion Level;Transfers;Hygiene/Grooming    Examination-Participation Restrictions Community Activity;Occupation    Stability/Clinical Decision Making Stable/Uncomplicated    Rehab Potential Good    PT Frequency 2x / week    PT Duration 6 weeks    PT Treatment/Interventions ADLs/Self Care Home Management;Canalith Repostioning;Functional mobility training;Therapeutic activities;Therapeutic exercise;Balance training;Neuromuscular re-education;Patient/family education;Vestibular;Aquatic Therapy;DME Instruction;Gait training;Stair training;Dry needling;Passive range of motion;Manual techniques    PT Next Visit Plan Work simulation, step stool, reaching up, etc.  Continue to work on static and dynamic balance, balance reactions, weight shifting and recovery.  Corner balance with head movements    PT Home Exercise Plan [id number]             Patient will benefit from skilled therapeutic intervention in order to improve the following deficits and impairments:  Decreased balance, Dizziness, Abnormal gait, Difficulty walking, Decreased mobility  Visit Diagnosis: Dizziness and giddiness  Unsteadiness on feet  Other abnormalities of gait and mobility     Problem List Patient Active Problem List   Diagnosis Date Noted   Total knee replacement status, right 12/17/2019   Osteoarthritis of right knee 12/13/2019   Essential hypertension 12/04/2018   Sleep apnea 12/04/2018   Chronic anticoagulation 12/04/2018   IgM lambda monoclonal gammopathy 01/25/2018   Peripheral neuropathy 12/26/2017   Primary osteoarthritis of left hip 10/25/2015    Atrial fibrillation, chronic 08/26/2015    Rico Junker, PT, DPT 10/16/21    9:39 AM   [facility] [address] [place], [place], [address] Phone: [phone]   Fax:  [phone]  Name: Shaquel Chavous MRN: [id number] Date of Birth: 1941-04-12

## 2021-10-21 DIAGNOSIS — E1169 Type 2 diabetes mellitus with other specified complication: Secondary | ICD-10-CM | POA: Diagnosis not present

## 2021-10-21 DIAGNOSIS — D472 Monoclonal gammopathy: Secondary | ICD-10-CM | POA: Diagnosis not present

## 2021-10-21 DIAGNOSIS — D6869 Other thrombophilia: Secondary | ICD-10-CM | POA: Diagnosis not present

## 2021-10-21 DIAGNOSIS — M519 Unspecified thoracic, thoracolumbar and lumbosacral intervertebral disc disorder: Secondary | ICD-10-CM | POA: Diagnosis not present

## 2021-10-21 DIAGNOSIS — E7800 Pure hypercholesterolemia, unspecified: Secondary | ICD-10-CM | POA: Diagnosis not present

## 2021-10-21 DIAGNOSIS — I10 Essential (primary) hypertension: Secondary | ICD-10-CM | POA: Diagnosis not present

## 2021-10-21 DIAGNOSIS — I4891 Unspecified atrial fibrillation: Secondary | ICD-10-CM | POA: Diagnosis not present

## 2021-10-21 DIAGNOSIS — Z79899 Other long term (current) drug therapy: Secondary | ICD-10-CM | POA: Diagnosis not present

## 2021-10-22 ENCOUNTER — Ambulatory Visit: Payer: Medicare Other | Admitting: Physical Therapy

## 2021-10-22 ENCOUNTER — Other Ambulatory Visit: Payer: Self-pay

## 2021-10-22 VITALS — BP 144/99 | HR 60

## 2021-10-22 DIAGNOSIS — R2689 Other abnormalities of gait and mobility: Secondary | ICD-10-CM | POA: Diagnosis not present

## 2021-10-22 DIAGNOSIS — H8111 Benign paroxysmal vertigo, right ear: Secondary | ICD-10-CM | POA: Diagnosis not present

## 2021-10-22 DIAGNOSIS — R42 Dizziness and giddiness: Secondary | ICD-10-CM | POA: Diagnosis not present

## 2021-10-22 DIAGNOSIS — R2681 Unsteadiness on feet: Secondary | ICD-10-CM

## 2021-10-22 NOTE — Therapy (Signed)
[facility] [address] [place], [place], [address] Phone: [phone]   Fax:  [phone]  Physical Therapy Treatment  Patient Details  Name: Glen Bolton MRN: [id number] Date of Birth: 05/13/1941 Referring Provider (PT): Lujean Amel, MD   Encounter Date: 10/22/2021   PT End of Session - 10/22/21 0849     Visit Number 6    Number of Visits 12    Date for PT Re-Evaluation 11/11/21    Authorization Type UHC Medicare    Progress Note Due on Visit 10    PT Start Time 0803    PT Stop Time 0843    PT Time Calculation (min) 40 min    Activity Tolerance Patient tolerated treatment well    Behavior During Therapy [facility] for tasks assessed/performed             Past Medical History:  Diagnosis Date   Anxiety    Arthritis    Atrial fibrillation, chronic 08/26/2015   Borderline diabetic    Chronic anticoagulation 12/04/2018   CHADS VASC-3-Xarelto   GERD (gastroesophageal reflux disease)    Glaucoma    Hard of hearing    History of gastritis    History of kidney stones    History of stomach ulcers    Hx of migraines    no migraines since he was in his 58's   Hypertension    Osteoarthritis of right knee 12/13/2019   Peripheral neuropathy 12/26/2017   Primary osteoarthritis of left hip 10/25/2015   Sleep apnea    CPAP  not currently working  couldnt breathe with it laying down trying something different end of  April or May 2021    Past Surgical History:  Procedure Laterality Date   CATARACT EXTRACTION Bilateral 2014   COLONOSCOPY     ESOPHAGOGASTRODUODENOSCOPY     EYE SURGERY     INGUINAL HERNIA REPAIR Left    x 2   Rotator cuff surgery Right 2013   TOTAL HIP ARTHROPLASTY Left 10/27/2015   Procedure: TOTAL HIP ARTHROPLASTY ANTERIOR APPROACH;  Surgeon: Frederik Pear, MD;  Location: [facility];  Service: Orthopedics;  Laterality: Left;   TOTAL KNEE ARTHROPLASTY Right 12/17/2019   Procedure: RIGHT TOTAL KNEE  ARTHROPLASTY;  Surgeon: Frederik Pear, MD;  [facility];  Service: Orthopedics;  Laterality: Right;    Vitals:   10/22/21 0810  BP: (!) 144/99  Pulse: 60     Subjective Assessment - 10/22/21 0804     Subjective Saw PCP yesterday; BP was high and vision is blurry - put in a referral for cardiology and eye exam.  PCP prescribed BP medication; is picking it up today.  Has been practicing step ups and is starting to feel more stable.    Pertinent History h/o BPPV, OA, atrial fibrillation, dysrhythmia, GERD, glaucoma, hearing loss, h/o kidney stones, h/o stomach ulcers, h/o migraines, HTN, peripheral neuropathy, OSA, cataract extraction, R rotator cuff surgery, L THA    Patient Stated Goals To stop falls    Currently in Pain? No/denies              Vestibular Treatment/Exercise - 10/22/21 0811       Vestibular Treatment/Exercise   Vestibular Treatment Provided Gaze    Gaze Exercises X1 Viewing Horizontal;X1 Viewing Vertical      X1 Viewing Horizontal   Foot Position standing feet together, solid surface    Reps 2    Comments 60 seconds, mild sway  X1 Viewing Vertical   Foot Position standing feet together    Reps 2    Comments 60 seconds, mild symptoms of dizziness               Balance Exercises - 10/22/21 0826       Balance Exercises: Standing   Balance Beam Standing across blue compliant beam performing alternating R and L step forward<>back placing foot back on beam in between x 10 reps each side beginning with bilat UE support on counter > finger tip touch on support.  Progressed to finger tip touch performing forward<> backwards stepping off beam but without touch down in middle x 5 reps each LE with min A for balance.  Performed static stance across beam, wide stance performing 10 reps each alternating reaching up and alternating reaching across midline with min A for balance.      [place] Walking Support               PT Education  - 10/22/21 0848     Education Details added VOR back in to HEP    Person(s) Educated Patient    Methods Explanation;Demonstration    Comprehension Verbalized understanding;Returned demonstration              PT Short Term Goals - 10/07/21 0845       PT SHORT TERM GOAL #1   Title Pt will demonstrate negative positional testing    Baseline L posterior canal BPPV    Time 3    Period Weeks    Status Achieved    Target Date 10/21/21               PT Long Term Goals - 10/16/21 0856       PT LONG TERM GOAL #1   Title Pt will demonstrate independence with HEP    Time 6    Period Weeks    Status New    Target Date 11/11/21      PT LONG TERM GOAL #2   Title Pt will report no dizziness during any of his work tasks    Baseline Looking up at shelves can cause dizziness/imbalance    Time 6    Period Weeks    Status New    Target Date 11/11/21      PT LONG TERM GOAL #3   Title Pt will be able to maintain his balance for 30 sec in condition 4 of mCTSIB to demo improved vestibular integration    Baseline 13.87 sec    Time 6    Period Weeks    Status New    Target Date 11/11/21      PT LONG TERM GOAL #4   Title Pt will have improved FGA score to at least 22/30 to demo decreased fall risk    Baseline 13/30    Time 3    Period Weeks    Status New    Target Date 11/11/21      PT LONG TERM GOAL #5   Title Pt will have increased FOTO score to at least 57    Baseline 39    Time 6    Period Weeks    Status New    Target Date 11/11/21                   Plan - 10/22/21 1050     Clinical Impression Statement Continues to demonstrate resolution of BPPV.  Continued to focus on  functional ankle and hip strategy training and stepping strategy to facilitate increased safety at work.  Pt does have visual impairment which may also be contributing to balance impairments.  PT to monitor BP in each session now as pt initiates BP medication.  Added VOR back into HEP due  to ongoing symptoms of dizziness when performing.    Personal Factors and Comorbidities Comorbidity 3+;Fitness;Past/Current Experience    Comorbidities BPPV, fall, OA, atrial fibrillation, dysrhythmia, GERD, glaucoma, hearing loss, h/o kidney stones, h/o stomach ulcers, h/o migraines, HTN, peripheral neuropathy, OSA, cataract extraction, R rotator cuff surgery, L THA    Examination-Activity Limitations Bed Mobility;Bend;Stand;Locomotion Level;Transfers;Hygiene/Grooming    Examination-Participation Restrictions Community Activity;Occupation    Stability/Clinical Decision Making Stable/Uncomplicated    Rehab Potential Good    PT Frequency 2x / week    PT Duration 6 weeks    PT Treatment/Interventions ADLs/Self Care Home Management;Canalith Repostioning;Functional mobility training;Therapeutic activities;Therapeutic exercise;Balance training;Neuromuscular re-education;Patient/family education;Vestibular;Aquatic Therapy;DME Instruction;Gait training;Stair training;Dry needling;Passive range of motion;Manual techniques    PT Next Visit Plan Progress VOR.  Work simulation, step stool, reaching up, etc.  Continue to work on static and dynamic balance, balance reactions, weight shifting and recovery.  Corner balance with head movements    PT Home Exercise Plan [id number]    Consulted and Agree with Plan of Care Patient             Patient will benefit from skilled therapeutic intervention in order to improve the following deficits and impairments:  Decreased balance, Dizziness, Abnormal gait, Difficulty walking, Decreased mobility  Visit Diagnosis: Dizziness and giddiness  Unsteadiness on feet  Other abnormalities of gait and mobility     Problem List Patient Active Problem List   Diagnosis Date Noted   Total knee replacement status, right 12/17/2019   Osteoarthritis of right knee 12/13/2019   Essential hypertension 12/04/2018   Sleep apnea 12/04/2018   Chronic anticoagulation 12/04/2018    IgM lambda monoclonal gammopathy 01/25/2018   Peripheral neuropathy 12/26/2017   Primary osteoarthritis of left hip 10/25/2015   Atrial fibrillation, chronic 08/26/2015    Rico Junker, PT, DPT 10/22/21    10:53 AM    [facility] [address] [place], [place], [address] Phone: [phone]   Fax:  [phone]  Name: Glen Bolton MRN: [id number] Date of Birth: 1940-09-27

## 2021-10-22 NOTE — Patient Instructions (Signed)
Access Code: [email or web address] URL: [email or web address] Date: 10/13/2021 Prepared by: Estill Bamberg [name]  Exercises Heel Toe Raises with Counter Support - 1 x daily - 7 x weekly - 2 sets - 10 reps Sit to Stand - 1 x daily - 7 x weekly - 2 sets - 10 reps Side Stepping with Resistance at Ankles and Counter Support - 1 x daily - 7 x weekly - 2 sets - 10 reps Forward and Backward Monster Walk with Resistance at Ankles and Counter Support - 1 x daily - 7 x weekly - 2 sets - 10 reps Romberg Stance on Foam Pad with Head Rotation - 1 x daily - 7 x weekly - 2 sets - 30 sec hold Romberg Stance with Head Nods on Foam Pad - 1 x daily - 7 x weekly - 2 sets - 30 sec hold Romberg Stance Eyes Closed on Foam Pad - 1 x daily - 7 x weekly - 2 sets - 30 sec hold Standing Romberg to 1/2 Tandem Stance - 1 x daily - 7 x weekly - 2 sets - 20 sec hold   Gaze Stabilization: Standing Feet Together    Feet together, keeping eyes on target on wall __3__ feet away, tilt head down 15-30 and move head side to side for __60__ seconds. Repeat while moving head up and down for __60__ seconds. Do __2__ sessions per day.

## 2021-10-28 DIAGNOSIS — H401133 Primary open-angle glaucoma, bilateral, severe stage: Secondary | ICD-10-CM | POA: Diagnosis not present

## 2021-10-29 ENCOUNTER — Encounter: Payer: Self-pay | Admitting: Physical Therapy

## 2021-10-29 ENCOUNTER — Ambulatory Visit: Payer: Medicare Other | Admitting: Physical Therapy

## 2021-10-29 ENCOUNTER — Other Ambulatory Visit: Payer: Self-pay

## 2021-10-29 VITALS — BP 138/87 | HR 55

## 2021-10-29 DIAGNOSIS — R2681 Unsteadiness on feet: Secondary | ICD-10-CM

## 2021-10-29 DIAGNOSIS — R42 Dizziness and giddiness: Secondary | ICD-10-CM

## 2021-10-29 DIAGNOSIS — R2689 Other abnormalities of gait and mobility: Secondary | ICD-10-CM

## 2021-10-29 DIAGNOSIS — H8111 Benign paroxysmal vertigo, right ear: Secondary | ICD-10-CM | POA: Diagnosis not present

## 2021-10-29 NOTE — Therapy (Signed)
[facility] [address] [place], [place], [address] Phone: [phone]   Fax:  [phone]  Physical Therapy Treatment  Patient Details  Name: Glen Bolton MRN: [id number] Date of Birth: May 07, 1941 Referring Provider (PT): Lujean Amel, MD   Encounter Date: 10/29/2021   PT End of Session - 10/29/21 0804     Visit Number 7    Number of Visits 12    Date for PT Re-Evaluation 11/11/21    Authorization Type [employer] Medicare    Progress Note Due on Visit 10    PT Start Time 0802    PT Stop Time 0845    PT Time Calculation (min) 43 min    Activity Tolerance Patient tolerated treatment well    Behavior During Therapy [facility] for tasks assessed/performed             Past Medical History:  Diagnosis Date   Anxiety    Arthritis    Atrial fibrillation, chronic 08/26/2015   Borderline diabetic    Chronic anticoagulation 12/04/2018   CHADS VASC-3-Xarelto   GERD (gastroesophageal reflux disease)    Glaucoma    Hard of hearing    History of gastritis    History of kidney stones    History of stomach ulcers    Hx of migraines    no migraines since he was in his 72's   Hypertension    Osteoarthritis of right knee 12/13/2019   Peripheral neuropathy 12/26/2017   Primary osteoarthritis of left hip 10/25/2015   Sleep apnea    CPAP  not currently working  couldnt breathe with it laying down trying something different end of  April or May 2021    Past Surgical History:  Procedure Laterality Date   CATARACT EXTRACTION Bilateral 2014   COLONOSCOPY     ESOPHAGOGASTRODUODENOSCOPY     EYE SURGERY     INGUINAL HERNIA REPAIR Left    x 2   Rotator cuff surgery Right 2013   TOTAL HIP ARTHROPLASTY Left 10/27/2015   Procedure: TOTAL HIP ARTHROPLASTY ANTERIOR APPROACH;  Surgeon: Frederik Pear, MD;  Location: [facility];  Service: Orthopedics;  Laterality: Left;   TOTAL KNEE ARTHROPLASTY Right 12/17/2019   Procedure: RIGHT TOTAL KNEE  ARTHROPLASTY;  Surgeon: Frederik Pear, MD;  Location: [facility];  Service: Orthopedics;  Laterality: Right;    Vitals:   10/29/21 0807 10/29/21 0824  BP: (!) 141/98 138/87  Pulse: 64 (!) 55     Subjective Assessment - 10/29/21 0803     Subjective No new complaints. No falls. Saw eye doctor- has glaucoma, starting drops and maybe surgery- has to see specialist first. Has has not heard from Cardiology. New ex's going well at home. No change in dizziness.    Pertinent History h/o BPPV, OA, atrial fibrillation, dysrhythmia, GERD, glaucoma, hearing loss, h/o kidney stones, h/o stomach ulcers, h/o migraines, HTN, peripheral neuropathy, OSA, cataract extraction, R rotator cuff surgery, L THA    Patient Stated Goals To stop falls    Currently in Pain? No/denies                    [facility] Adult PT Treatment/Exercise - 10/29/21 0807       Transfers   Transfers Sit to Stand;Stand to Sit    Sit to Stand 5: Supervision    Stand to Sit 4: Min guard      Ambulation/Gait   Ambulation/Gait Yes    Ambulation/Gait Assistance 5: Supervision    Ambulation  Distance (Feet) --   around clinic with session   Assistive device None    Gait Pattern Step-through pattern;Decreased stride length;Trendelenburg;Lateral trunk lean to right;Lateral trunk lean to left;Wide base of support    Ambulation Surface Level;Indoor      Neuro Re-ed    Neuro Re-ed Details  for balance/mucle re-ed/habituation: gait around track tracking sunshine in ball with eyes as head moved slowly in small movements left<>right for half, then up<>donw for second half. Increased symtoms with side<>side vs up<>down, resolved quickly with both.             Vestibular Treatment/Exercise - 10/29/21 0808       X1 Viewing Horizontal   Foot Position standing feet together, solid surface    Reps 2    Comments 60 seconds, mild sway noted with 1st rep, none with second rep.      X1 Viewing Vertical   Foot Position standing feet  together    Reps 2    Comments 60 secones, stable with no sway noted                Balance Exercises - 10/29/21 0825       Balance Exercises: Standing   Standing Eyes Closed Narrow base of support (BOS);Foam/compliant surface;3 reps;30 secs;Limitations    Standing Eyes Closed Limitations on airex with no UE support, occasional touch to sturdy surface with min guard to min assist for balance.    Balance Beam standing across blue foam beam: alternating forward stepping to floor/back onto beam, then alternating backward stepping to floor/back onto beam for ~10 reps each/each way with no UE support, occasional touch to bars. min guard to min assist for balance.    Partial Tandem Stance Eyes closed;Foam/compliant surface;Intermittent upper extremity support;2 reps;20 secs;Limitations    Partial Tandem Stance Limitations on airex with occasional touch to sturdy surface for 2 reps each foot forward. min guard to min assist for balance.                  PT Short Term Goals - 10/07/21 0845       PT SHORT TERM GOAL #1   Title Pt will demonstrate negative positional testing    Baseline L posterior canal BPPV    Time 3    Period Weeks    Status Achieved    Target Date 10/21/21               PT Long Term Goals - 10/16/21 0856       PT LONG TERM GOAL #1   Title Pt will demonstrate independence with HEP    Time 6    Period Weeks    Status New    Target Date 11/11/21      PT LONG TERM GOAL #2   Title Pt will report no dizziness during any of his work tasks    Baseline Looking up at shelves can cause dizziness/imbalance    Time 6    Period Weeks    Status New    Target Date 11/11/21      PT LONG TERM GOAL #3   Title Pt will be able to maintain his balance for 30 sec in condition 4 of mCTSIB to demo improved vestibular integration    Baseline 13.87 sec    Time 6    Period Weeks    Status New    Target Date 11/11/21      PT LONG TERM GOAL #4   Title Pt  will  have improved FGA score to at least 22/30 to demo decreased fall risk    Baseline 13/30    Time 3    Period Weeks    Status New    Target Date 11/11/21      PT LONG TERM GOAL #5   Title Pt will have increased FOTO score to at least 57    Baseline 39    Time 6    Period Weeks    Status New    Target Date 11/11/21                   Plan - 10/29/21 0804     Clinical Impression Statement Today's session continued to focus on activities that invoke increased vestiublar imput and address balance training on compliant surfaces with mild symptoms reported with gait tasks only. The pt is making steady progress toward goals and should benefit from continued PT to progress toward unmet goals.    Personal Factors and Comorbidities Comorbidity 3+;Fitness;Past/Current Experience    Comorbidities BPPV, fall, OA, atrial fibrillation, dysrhythmia, GERD, glaucoma, hearing loss, h/o kidney stones, h/o stomach ulcers, h/o migraines, HTN, peripheral neuropathy, OSA, cataract extraction, R rotator cuff surgery, L THA    Examination-Activity Limitations Bed Mobility;Bend;Stand;Locomotion Level;Transfers;Hygiene/Grooming    Examination-Participation Restrictions Community Activity;Occupation    Stability/Clinical Decision Making Stable/Uncomplicated    Rehab Potential Good    PT Frequency 2x / week    PT Duration 6 weeks    PT Treatment/Interventions ADLs/Self Care Home Management;Canalith Repostioning;Functional mobility training;Therapeutic activities;Therapeutic exercise;Balance training;Neuromuscular re-education;Patient/family education;Vestibular;Aquatic Therapy;DME Instruction;Gait training;Stair training;Dry needling;Passive range of motion;Manual techniques    PT Next Visit Plan Progress VOR.  Work simulation, step stool, reaching up, etc.  Continue to work on static and dynamic balance, balance reactions, weight shifting and recovery.  Corner balance with head movements    PT Home Exercise  Plan [email or web address]    Consulted and Agree with Plan of Care Patient             Patient will benefit from skilled therapeutic intervention in order to improve the following deficits and impairments:  Decreased balance, Dizziness, Abnormal gait, Difficulty walking, Decreased mobility  Visit Diagnosis: Dizziness and giddiness  Unsteadiness on feet  Other abnormalities of gait and mobility     Problem List Patient Active Problem List   Diagnosis Date Noted   Total knee replacement status, right 12/17/2019   Osteoarthritis of right knee 12/13/2019   Essential hypertension 12/04/2018   Sleep apnea 12/04/2018   Chronic anticoagulation 12/04/2018   IgM lambda monoclonal gammopathy 01/25/2018   Peripheral neuropathy 12/26/2017   Primary osteoarthritis of left hip 10/25/2015   Atrial fibrillation, chronic 08/26/2015    Willow Ora, PTA, [facility] [address], [place], [place] [address] [phone] 10/29/21, 3:19 PM   Name: Glen Bolton MRN: [id number] Date of Birth: 1940-11-18

## 2021-11-04 DIAGNOSIS — E7800 Pure hypercholesterolemia, unspecified: Secondary | ICD-10-CM | POA: Diagnosis not present

## 2021-11-04 DIAGNOSIS — E1169 Type 2 diabetes mellitus with other specified complication: Secondary | ICD-10-CM | POA: Diagnosis not present

## 2021-11-04 DIAGNOSIS — Z79899 Other long term (current) drug therapy: Secondary | ICD-10-CM | POA: Diagnosis not present

## 2021-11-05 ENCOUNTER — Other Ambulatory Visit: Payer: Self-pay

## 2021-11-05 ENCOUNTER — Ambulatory Visit: Payer: Medicare Other | Attending: Family Medicine | Admitting: Physical Therapy

## 2021-11-05 VITALS — BP 110/60

## 2021-11-05 DIAGNOSIS — R2689 Other abnormalities of gait and mobility: Secondary | ICD-10-CM | POA: Diagnosis not present

## 2021-11-05 DIAGNOSIS — R42 Dizziness and giddiness: Secondary | ICD-10-CM | POA: Insufficient documentation

## 2021-11-05 DIAGNOSIS — H8111 Benign paroxysmal vertigo, right ear: Secondary | ICD-10-CM | POA: Diagnosis not present

## 2021-11-05 DIAGNOSIS — R0602 Shortness of breath: Secondary | ICD-10-CM | POA: Insufficient documentation

## 2021-11-05 DIAGNOSIS — R2681 Unsteadiness on feet: Secondary | ICD-10-CM | POA: Diagnosis not present

## 2021-11-05 NOTE — Patient Instructions (Addendum)
Access Code: [email or web address] ?URL: [email or web address] ?Date: 10/13/2021 ?Prepared by: Estill Bamberg [name] ? ?Exercises ?Heel Toe Raises with Counter Support - 1 x daily - 7 x weekly - 2 sets - 10 reps ?Sit to Stand - 1 x daily - 7 x weekly - 2 sets - 10 reps ?Side Stepping with Resistance at Ankles and Counter Support - 1 x daily - 7 x weekly - 2 sets - 10 reps ?Forward and Backward Monster Walk with Resistance at Ankles and Counter Support - 1 x daily - 7 x weekly - 2 sets - 10 reps ?Romberg Stance on Foam Pad with Head Rotation - 1 x daily - 7 x weekly - 2 sets - 30 sec hold ?Romberg Stance with Head Nods on Foam Pad - 1 x daily - 7 x weekly - 2 sets - 30 sec hold ?Romberg Stance Eyes Closed on Foam Pad - 1 x daily - 7 x weekly - 2 sets - 30 sec hold ?Standing Romberg to 1/2 Tandem Stance - 1 x daily - 7 x weekly - 2 sets - 20 sec hold ? ? ?Gaze Stabilization: Standing Feet Together ? ? ? ?Feet together, keeping eyes on target on wall __3__ feet away, tilt head down 15-30? and move head side to side for __60__ seconds.  Try to turn your head slightly faster than normal. ?Repeat while moving head up and down for __60__ seconds.  Try to nod your head slightly faster than normal. ?Do __2__ sessions per day. ? ? ?

## 2021-11-05 NOTE — Progress Notes (Signed)
?  ?Cardiology Office Note ? ? ?Date:  11/06/2021  ? ?ID:  Glen Bolton, DOB 07/13/1941, MRN [id number] ? ?PCP:  Lujean Amel, MD  ?Cardiologist:   Minus Breeding, MD ? ? ?Chief Complaint  ?Patient presents with  ? Atrial Fibrillation  ? ? ?  ?History of Present Illness: ?Glen Bolton is a 81 y.o. male who presents for evaluation of atrial fibrillation.  Since I last saw him he has had no new cardiovascular complaints.  He is having some trouble with his vision and with his balance.  He is working with physical therapy.  He does not really notice his atrial fibrillation.  He does not have any palpitations, presyncope or syncope.  He has no chest pressure, neck or arm discomfort.  He has a little bit of weight loss.  He has no new edema ? ?Of note he has some pulmonary hypertension on echo.  His EF is well-preserved.  He is being followed by hematology for MGUS but there has been no suggestion that he has any amyloid involvement in his heart.   ? ? ?Past Medical History:  ?Diagnosis Date  ? Anxiety   ? Arthritis   ? Atrial fibrillation, chronic 08/26/2015  ? Borderline diabetic   ? Chronic anticoagulation 12/04/2018  ? CHADS VASC-3-Xarelto  ? GERD (gastroesophageal reflux disease)   ? Glaucoma   ? Hard of hearing   ? History of gastritis   ? History of kidney stones   ? History of stomach ulcers   ? Hx of migraines   ? no migraines since he was in his 33's  ? Hypertension   ? Osteoarthritis of right knee 12/13/2019  ? Peripheral neuropathy 12/26/2017  ? Primary osteoarthritis of left hip 10/25/2015  ? Sleep apnea   ? CPAP  not currently working  couldnt breathe with it laying down trying something different end of  April or May 2021  ? ? ?Past Surgical History:  ?Procedure Laterality Date  ? CATARACT EXTRACTION Bilateral 2014  ? COLONOSCOPY    ? ESOPHAGOGASTRODUODENOSCOPY    ? EYE SURGERY    ? INGUINAL HERNIA REPAIR Left   ? x 2  ? Rotator cuff surgery Right 2013  ? TOTAL HIP ARTHROPLASTY Left 10/27/2015  ? Procedure: TOTAL  HIP ARTHROPLASTY ANTERIOR APPROACH;  Surgeon: Frederik Pear, MD;  Location: [facility];  Service: Orthopedics;  Laterality: Left;  ? TOTAL KNEE ARTHROPLASTY Right 12/17/2019  ? Procedure: RIGHT TOTAL KNEE ARTHROPLASTY;  Surgeon: Frederik Pear, MD;  Location: [facility];  Service: Orthopedics;  Laterality: Right;  ? ? ? ?Current Outpatient Medications  ?Medication Sig Dispense Refill  ? chlorthalidone (HYGROTON) 25 MG tablet Take 25 mg by mouth every morning.    ? dorzolamide (TRUSOPT) 2 % ophthalmic solution 1 drop into affected eye    ? dorzolamide-timolol (COSOPT) 22.3-6.8 MG/ML ophthalmic solution Place 1 drop into both eyes 2 (two) times daily.   8  ? metoprolol succinate (TOPROL-XL) 50 MG 24 hr tablet TAKE 1 TABLET(50 MG) BY MOUTH DAILY 90 tablet 3  ? Multiple Vitamin (MULTIVITAMIN WITH MINERALS) TABS tablet Take 1 tablet by mouth daily.    ? olmesartan (BENICAR) 40 MG tablet Take 40 mg by mouth daily.   4  ? Probiotic Product (PROBIOTIC DAILY PO) Take 1 capsule by mouth every evening.    ? rivaroxaban (XARELTO) 20 MG TABS tablet TAKE 1 TABLET BY MOUTH DAILY WITH SUPPER 90 tablet 1  ? triamcinolone (NASACORT) 55 MCG/ACT AERO nasal inhaler Place  2 sprays into the nose daily. 2 sprays each nostril at night on a regular basis 1 each 12  ? albuterol (VENTOLIN HFA) 108 (90 Base) MCG/ACT inhaler 2 puffs as needed    ? albuterol (VENTOLIN HFA) 108 (90 Base) MCG/ACT inhaler SMARTSIG:2 Puff(s) By Mouth Every 4 Hours PRN    ? hydrochlorothiazide (HYDRODIURIL) 12.5 MG tablet Take 12.5 mg by mouth every evening.     ? methylPREDNISolone (MEDROL DOSEPAK) 4 MG TBPK tablet 6 day dose pack - take as directed 21 tablet 0  ? ?No current facility-administered medications for this visit.  ? ? ?Allergies:   Patient has no known allergies.  ? ? ?ROS:  Please see the history of present illness.   Otherwise, review of systems are positive for none.   All other systems are reviewed and negative.  ? ? ?PHYSICAL EXAM: ?VS:  BP 118/72   Pulse (!) 58    Ht 5\' 10"  (1.778 m)   Wt 264 lb 6.4 oz (119.9 kg)   SpO2 94%   BMI 37.94 kg/m?  , BMI Body mass index is 37.94 kg/m?. ?GENERAL:  Well appearing ?NECK:  No jugular venous distention, waveform within normal limits, carotid upstroke brisk and symmetric, no bruits, no thyromegaly ?LUNGS:  Clear to auscultation bilaterally ?CHEST:  Unremarkable ?HEART:  PMI not displaced or sustained,S1 and S2 within normal limits, no Y0,DX clicks, no rubs, no murmurs, irregular ?ABD:  Flat, positive bowel sounds normal in frequency in pitch, no bruits, no rebound, no guarding, no midline pulsatile mass, no hepatomegaly, no splenomegaly ?EXT:  2 plus pulses throughout, no edema, no cyanosis no clubbing ? ? ?EKG:  EKG is  ordered today. ?The ekg ordered today demonstrates atrial fibrillation, rate 58, axis within normal limits, intervals within normal limits, no acute ST-T wave changes. ? ? ?Recent Labs: ?12/12/2020: ALT 31; BUN 21; Creatinine 0.94; Hemoglobin 15.7; Platelets 154; Potassium 4.1; Sodium 141  ? ? ?Lipid Panel ?No results found for: CHOL, TRIG, HDL, CHOLHDL, VLDL, LDLCALC, LDLDIRECT ?  ? ?Wt Readings from Last 3 Encounters:  ?11/06/21 264 lb 6.4 oz (119.9 kg)  ?02/09/21 274 lb (124.3 kg)  ?01/28/21 272 lb 12.8 oz (123.7 kg)  ?  ? ? ?Other studies Reviewed: ?Additional studies/ records that were reviewed today include: Labs ?Review of the above records demonstrates:  Please see elsewhere in the note.   ? ? ?ASSESSMENT AND PLAN: ? ?CAF- ?[name] has a CHA2DS2 - VASc score of 3.    No change in therapy ? ?Essential HTN ?Her blood pressure is at target.  No change in therapy.  ? ?Sleep apnea ?He is not using his CPAP.   I have encouraged him to try to use this or to let us know if he wants [place] to schedule him to get another mask.  He uses CPAP.  However, it does not sound like this is well treated.  I sent a message to our sleep nurse to get him back in the program.  Management of this I think is important for  management of his elevated pulmonary pressures and what he is generalized fatigue. ? ?Morbid obesity ?He had some weight loss and I encouraged more of the same.  ? ?Pulmonary hypertension ?I suspect that this is not primary pulmonary hypertension.  I am going to measure another echocardiogram.  Further management and evaluation will be based on these results. ? ?Current medicines are reviewed at length with the patient today.  The patient  does not have  concerns regarding medicines. ? ?The following changes have been made:  no change ? ?Labs/ tests ordered today include:  ? ?Orders Placed This Encounter  ?Procedures  ? EKG 12-Lead  ? ECHOCARDIOGRAM COMPLETE  ? ? ? ?Disposition:   FU with me in one year.   ? ? ?Signed, ?Minus Breeding, MD  ?11/06/2021 8:53 AM    ?[place] ? ? ? ?

## 2021-11-05 NOTE — Therapy (Signed)
[facility] [address] [place], [place], [address] Phone: [phone]   Fax:  [phone]  Physical Therapy Treatment  Patient Details  Name: Glen Bolton MRN: [id number] Date of Birth: 1940/12/12 Referring Provider (PT): Lujean Amel, MD   Encounter Date: 11/05/2021   PT End of Session - 11/05/21 1044     Visit Number 8    Number of Visits 12    Date for PT Re-Evaluation 11/11/21    Authorization Type [employer] Medicare    Progress Note Due on Visit 10    PT Start Time 0806    PT Stop Time 0845    PT Time Calculation (min) 39 min    Activity Tolerance Patient tolerated treatment well    Behavior During Therapy [facility] for tasks assessed/performed             Past Medical History:  Diagnosis Date   Anxiety    Arthritis    Atrial fibrillation, chronic 08/26/2015   Borderline diabetic    Chronic anticoagulation 12/04/2018   CHADS VASC-3-Xarelto   GERD (gastroesophageal reflux disease)    Glaucoma    Hard of hearing    History of gastritis    History of kidney stones    History of stomach ulcers    Hx of migraines    no migraines since he was in his 5's   Hypertension    Osteoarthritis of right knee 12/13/2019   Peripheral neuropathy 12/26/2017   Primary osteoarthritis of left hip 10/25/2015   Sleep apnea    CPAP  not currently working  couldnt breathe with it laying down trying something different end of  April or May 2021    Past Surgical History:  Procedure Laterality Date   CATARACT EXTRACTION Bilateral 2014   COLONOSCOPY     ESOPHAGOGASTRODUODENOSCOPY     EYE SURGERY     INGUINAL HERNIA REPAIR Left    x 2   Rotator cuff surgery Right 2013   TOTAL HIP ARTHROPLASTY Left 10/27/2015   Procedure: TOTAL HIP ARTHROPLASTY ANTERIOR APPROACH;  Surgeon: Frederik Pear, MD;  Location: [facility];  Service: Orthopedics;  Laterality: Left;   TOTAL KNEE ARTHROPLASTY Right 12/17/2019   Procedure: RIGHT TOTAL KNEE  ARTHROPLASTY;  Surgeon: Frederik Pear, MD;  Location: [facility];  Service: Orthopedics;  Laterality: Right;    Vitals:   11/05/21 1047  BP: 110/60     Subjective Assessment - 11/05/21 6734     Subjective Still hasn't heard from cardiology; saw PCP yesterday who took BP - pt felt like it was low for him.  Has not been feeling woozy.  Still hasn't noticed much improvement in his balance but feels like work is a little easier than it has been, doesn't has to move as slow or cautiously.    Pertinent History h/o BPPV, OA, atrial fibrillation, dysrhythmia, GERD, glaucoma, hearing loss, h/o kidney stones, h/o stomach ulcers, h/o migraines, HTN, peripheral neuropathy, OSA, cataract extraction, R rotator cuff surgery, L THA    Patient Stated Goals To stop falls    Currently in Pain? No/denies              Vestibular Treatment/Exercise - 11/05/21 0818       Vestibular Treatment/Exercise   Vestibular Treatment Provided Gaze    Gaze Exercises X1 Viewing Horizontal;X1 Viewing Vertical      X1 Viewing Horizontal   Foot Position solid surface, feet together.  Second set - same stance but focused on slightly faster  head movement.  Attempted feet apart on foam but pt required min-mod A to maintain balance    Reps 3    Comments 60 seconds, mild sway on solid surface.  Reports more challenge with faster speed.      X1 Viewing Vertical   Foot Position solid surface, feet together.  Second set - same stance but focused on slightly faster head movement.  Attempted feet apart on foam but pt required min-mod A to maintain balance    Reps 3    Comments 60 seconds, mild sway on solid surface.  Reports more challenge with faster speed.                Balance Exercises - 11/05/21 0830       Balance Exercises: Standing   Rockerboard Anterior/posterior;Head turns;EO;EC;10 seconds;10 reps;Intermittent UE support;Limitations    Rockerboard Limitations rocking ant/post 5 reps with UE, 5 reps without UE  support, 10 reps with EC with UE support.  Holding balance during 10 reps head turns/nods, EO.  Holding balance x 10 seconds without UE support with EC with min-mod A to maintain balance.                PT Education - 11/05/21 1043     Education Details progressed speed of VOR but did not change surface today    Person(s) Educated Patient    Methods Explanation;Demonstration    Comprehension Verbalized understanding;Returned demonstration              PT Short Term Goals - 10/07/21 0845       PT SHORT TERM GOAL #1   Title Pt will demonstrate negative positional testing    Baseline L posterior canal BPPV    Time 3    Period Weeks    Status Achieved    Target Date 10/21/21               PT Long Term Goals - 10/16/21 0856       PT LONG TERM GOAL #1   Title Pt will demonstrate independence with HEP    Time 6    Period Weeks    Status New    Target Date 11/11/21      PT LONG TERM GOAL #2   Title Pt will report no dizziness during any of his work tasks    Baseline Looking up at shelves can cause dizziness/imbalance    Time 6    Period Weeks    Status New    Target Date 11/11/21      PT LONG TERM GOAL #3   Title Pt will be able to maintain his balance for 30 sec in condition 4 of mCTSIB to demo improved vestibular integration    Baseline 13.87 sec    Time 6    Period Weeks    Status New    Target Date 11/11/21      PT LONG TERM GOAL #4   Title Pt will have improved FGA score to at least 22/30 to demo decreased fall risk    Baseline 13/30    Time 3    Period Weeks    Status New    Target Date 11/11/21      PT LONG TERM GOAL #5   Title Pt will have increased FOTO score to at least 57    Baseline 39    Time 6    Period Weeks    Status New    Target Date 11/11/21  Plan - 11/05/21 1044     Clinical Impression Statement Continued to progress VOR by increasing speed of head movement but not safe to progress to  compliant surface yet.  Continued dynamic balance and balance reaction training using rockerboard.  Pt demonstrated greatest difficulty with maintaining balance when performing vertical head movements and with EC.  Will continue to address and progress towards LTG.    Personal Factors and Comorbidities Comorbidity 3+;Fitness;Past/Current Experience    Comorbidities BPPV, fall, OA, atrial fibrillation, dysrhythmia, GERD, glaucoma, hearing loss, h/o kidney stones, h/o stomach ulcers, h/o migraines, HTN, peripheral neuropathy, OSA, cataract extraction, R rotator cuff surgery, L THA    Examination-Activity Limitations Bed Mobility;Bend;Stand;Locomotion Level;Transfers;Hygiene/Grooming    Examination-Participation Restrictions Community Activity;Occupation    Stability/Clinical Decision Making Stable/Uncomplicated    Rehab Potential Good    PT Frequency 2x / week    PT Duration 6 weeks    PT Treatment/Interventions ADLs/Self Care Home Management;Canalith Repostioning;Functional mobility training;Therapeutic activities;Therapeutic exercise;Balance training;Neuromuscular re-education;Patient/family education;Vestibular;Aquatic Therapy;DME Instruction;Gait training;Stair training;Dry needling;Passive range of motion;Manual techniques    PT Next Visit Plan Work simulation, step stool, ROCKERBOARD with reaching up, etc.  Continue to work on static and dynamic balance on compliant surface using hip reaction-wall bumps, balance reactions, weight shifting and recovery.    PT Home Exercise Plan [id number]    Consulted and Agree with Plan of Care Patient             Patient will benefit from skilled therapeutic intervention in order to improve the following deficits and impairments:  Decreased balance, Dizziness, Abnormal gait, Difficulty walking, Decreased mobility  Visit Diagnosis: Dizziness and giddiness  Unsteadiness on feet  Other abnormalities of gait and mobility     Problem List Patient  Active Problem List   Diagnosis Date Noted   Total knee replacement status, right 12/17/2019   Osteoarthritis of right knee 12/13/2019   Essential hypertension 12/04/2018   Sleep apnea 12/04/2018   Chronic anticoagulation 12/04/2018   IgM lambda monoclonal gammopathy 01/25/2018   Peripheral neuropathy 12/26/2017   Primary osteoarthritis of left hip 10/25/2015   Atrial fibrillation, chronic 08/26/2015    Rico Junker, PT, DPT 11/05/21    10:47 AM   [facility] [address] [place], [place], [address] Phone: [phone]   Fax:  [phone]  Name: Glen Bolton MRN: [id number] Date of Birth: 03-19-1941

## 2021-11-06 ENCOUNTER — Ambulatory Visit: Payer: Medicare Other | Admitting: Physical Therapy

## 2021-11-06 ENCOUNTER — Ambulatory Visit: Payer: Medicare Other | Admitting: Cardiology

## 2021-11-06 ENCOUNTER — Encounter: Payer: Self-pay | Admitting: Cardiology

## 2021-11-06 VITALS — BP 118/72 | HR 58 | Ht 70.0 in | Wt 264.4 lb

## 2021-11-06 DIAGNOSIS — I482 Chronic atrial fibrillation, unspecified: Secondary | ICD-10-CM | POA: Diagnosis not present

## 2021-11-06 DIAGNOSIS — I10 Essential (primary) hypertension: Secondary | ICD-10-CM | POA: Diagnosis not present

## 2021-11-06 DIAGNOSIS — R0602 Shortness of breath: Secondary | ICD-10-CM | POA: Diagnosis not present

## 2021-11-06 NOTE — Patient Instructions (Addendum)
Medication Instructions:  ?Your physician recommends that you continue on your current medications as directed. Please refer to the Current Medication list given to you today.  ?*If you need a refill on your cardiac medications before your next appointment, please call your pharmacy* ? ? ?Lab Work: ?None ?If you have labs (blood work) drawn today and your tests are completely normal, you will receive your results only by: ?MyChart Message (if you have MyChart) OR ?A paper copy in the mail ?If you have any lab test that is abnormal or we need to change your treatment, we will call you to review the results. ? ? ?Testing/Procedures: ?Your physician has requested that you have an echocardiogram. Echocardiography is a painless test that uses sound waves to create images of your heart. It provides your doctor with information about the size and shape of your heart and how well your heart?s chambers and valves are working. This procedure takes approximately one hour. There are no restrictions for this procedure. ? ? ?Follow-Up: ?At [facility], you and your health needs are our priority.  As part of our continuing mission to provide you with exceptional heart care, we have created designated Provider Care Teams.  These Care Teams include your primary Cardiologist (physician) and Advanced Practice Providers (APPs -  Physician Assistants and Nurse Practitioners) who all work together to provide you with the care you need, when you need it. ? ?We recommend signing up for the patient portal called "MyChart".  Sign up information is provided on this After Visit Summary.  MyChart is used to connect with patients for Virtual Visits (Telemedicine).  Patients are able to view lab/test results, encounter notes, upcoming appointments, etc.  Non-urgent messages can be sent to your provider as well.   ?To learn more about what you can do with MyChart, go to [email or web address].   ? ?Your next appointment:   ?1 year(s) ? ?The  format for your next appointment:   ?In Person ? ?Provider:   ?Minus Breeding, MD   ? ? ?Other Instructions ?  ?

## 2021-11-11 DIAGNOSIS — H401133 Primary open-angle glaucoma, bilateral, severe stage: Secondary | ICD-10-CM | POA: Diagnosis not present

## 2021-11-13 ENCOUNTER — Ambulatory Visit: Payer: Medicare Other | Admitting: Physical Therapy

## 2021-11-13 ENCOUNTER — Other Ambulatory Visit: Payer: Self-pay

## 2021-11-13 VITALS — BP 120/80

## 2021-11-13 DIAGNOSIS — R2689 Other abnormalities of gait and mobility: Secondary | ICD-10-CM | POA: Diagnosis not present

## 2021-11-13 DIAGNOSIS — H8111 Benign paroxysmal vertigo, right ear: Secondary | ICD-10-CM

## 2021-11-13 DIAGNOSIS — R2681 Unsteadiness on feet: Secondary | ICD-10-CM | POA: Diagnosis not present

## 2021-11-13 DIAGNOSIS — R42 Dizziness and giddiness: Secondary | ICD-10-CM | POA: Diagnosis not present

## 2021-11-13 NOTE — Therapy (Signed)
[facility] [address] [place], [place], [address] Phone: [phone]   Fax:  [phone]  Physical Therapy Treatment and 10th Visit Progress Note  Patient Details  Name: Glen Bolton MRN: [id number] Date of Birth: [birth year] Referring Provider (PT): Lujean Amel, MD   Encounter Date: 11/13/2021  Progress Note Reporting Period 09/30/2021 to 11/13/2021  See note below for Objective Data and Assessment of Progress/Goals.     PT End of Session - 11/13/21 1119     Visit Number 9    Number of Visits 17    Date for PT Re-Evaluation 01/12/22    Authorization Type [employer] Medicare  (10th visit note performed on visit 9)    Progress Note Due on Visit 19    PT Start Time 0900    PT Stop Time 0935    PT Time Calculation (min) 35 min    Activity Tolerance Patient tolerated treatment well    Behavior During Therapy WFL for tasks assessed/performed             Past Medical History:  Diagnosis Date   Anxiety    Arthritis    Atrial fibrillation, chronic 08/26/2015   Borderline diabetic    Chronic anticoagulation 12/04/2018   CHADS VASC-3-Xarelto   GERD (gastroesophageal reflux disease)    Glaucoma    Hard of hearing    History of gastritis    History of kidney stones    History of stomach ulcers    Hx of migraines    no migraines since he was in his 73's   Hypertension    Osteoarthritis of right knee 12/13/2019   Peripheral neuropathy 12/26/2017   Primary osteoarthritis of left hip 10/25/2015   Sleep apnea    CPAP  not currently working  couldnt breathe with it laying down trying something different end of  April or May 2021    Past Surgical History:  Procedure Laterality Date   CATARACT EXTRACTION Bilateral 2014   COLONOSCOPY     ESOPHAGOGASTRODUODENOSCOPY     EYE SURGERY     INGUINAL HERNIA REPAIR Left    x 2   Rotator cuff surgery Right 2013   TOTAL HIP ARTHROPLASTY Left 10/27/2015   Procedure: TOTAL HIP  ARTHROPLASTY ANTERIOR APPROACH;  Surgeon: Frederik Pear, MD;  Location: [facility];  Service: Orthopedics;  Laterality: Left;   TOTAL KNEE ARTHROPLASTY Right 12/17/2019   Procedure: RIGHT TOTAL KNEE ARTHROPLASTY;  Surgeon: Frederik Pear, MD;  Location: [facility];  Service: Orthopedics;  Laterality: Right;    Vitals:   11/13/21 0906  BP: 120/80     Subjective Assessment - 11/13/21 0901     Subjective Cardiology cut back on BP medication since BP is so low.  Has new drops for eyes and woke up with broken capillaries in R eye; irritated but not painful.    Pertinent History h/o BPPV, OA, atrial fibrillation, dysrhythmia, GERD, glaucoma, hearing loss, h/o kidney stones, h/o stomach ulcers, h/o migraines, HTN, peripheral neuropathy, OSA, cataract extraction, R rotator cuff surgery, L THA    Patient Stated Goals To stop falls               [facility] PT Assessment - 11/13/21 0907       Assessment   Medical Diagnosis R42 (ICD-10-CM) - Vertigo    Referring Provider (PT) Lujean Amel, MD    Onset Date/Surgical Date 11/09/19    Prior Therapy yes, orthopedic and vestibular      Precautions  Precautions Other (comment)    Precaution Comments OA, atrial fibrillation, dysrhythmia, GERD, glaucoma, hearing loss, h/o kidney stones, h/o stomach ulcers, h/o migraines, HTN, peripheral neuropathy, OSA, cataract extraction, R rotator cuff surgery, L THA      Prior Function   Level of Independence Independent      Observation/Other Assessments   Focus on Therapeutic Outcomes (FOTO)  Dizziness Positional Status: 53; Dizziness Functional Status: 54.3      Functional Gait  Assessment   Gait assessed  Yes    Gait Level Surface Walks 20 ft, slow speed, abnormal gait pattern, evidence for imbalance or deviates 10-15 in outside of the 12 in walkway width. Requires more than 7 sec to ambulate 20 ft.    Change in Gait Speed Able to change speed, demonstrates mild gait deviations, deviates 6-10 in outside of the 12 in  walkway width, or no gait deviations, unable to achieve a major change in velocity, or uses a change in velocity, or uses an assistive device.    Gait with Horizontal Head Turns Performs head turns smoothly with slight change in gait velocity (eg, minor disruption to smooth gait path), deviates 6-10 in outside 12 in walkway width, or uses an assistive device.    Gait with Vertical Head Turns Performs task with slight change in gait velocity (eg, minor disruption to smooth gait path), deviates 6 - 10 in outside 12 in walkway width or uses assistive device    Gait and Pivot Turn Pivot turns safely in greater than 3 sec and stops with no loss of balance, or pivot turns safely within 3 sec and stops with mild imbalance, requires small steps to catch balance.    Step Over Obstacle Is able to step over 2 stacked shoe boxes taped together (9 in total height) without changing gait speed. No evidence of imbalance.    Gait with Narrow Base of Support Ambulates less than 4 steps heel to toe or cannot perform without assistance.    Gait with Eyes Closed Walks 20 ft, slow speed, abnormal gait pattern, evidence for imbalance, deviates 10-15 in outside 12 in walkway width. Requires more than 9 sec to ambulate 20 ft.    Ambulating Backwards Walks 20 ft, slow speed, abnormal gait pattern, evidence for imbalance, deviates 10-15 in outside 12 in walkway width.    Steps Alternating feet, must use rail.    Total Score 16    FGA comment: 16/30                 Vestibular Assessment - 11/13/21 0918       Balancemaster   Balancemaster Comment MCTSIB: Condition 1 and 2: 30 seconds; Condition 3: 30 seconds.  Condition 4: 20 seconds but with close supervision and moderate sway      Positional Sensitivities   Sit to Supine No dizziness    Supine to Left Side No dizziness    Supine to Right Side No dizziness    Supine to Sitting No dizziness    Nose to Right Knee No dizziness    Right Knee to Sitting No dizziness     Nose to Left Knee No dizziness    Left Knee to Sitting No dizziness    Head Turning x 5 No dizziness    Head Nodding x 5 No dizziness    Pivot Right in Standing No dizziness    Pivot Left in Standing No dizziness    Rolling Right No dizziness    Rolling Left No  dizziness    Positional Sensitivities Comments No dizziness or wooziness but demonstrates imbalance and increased sway               PT Education - 11/13/21 1123     Education Details progress towards goals, plan to reduce frequency of visits to 1x/week and to continue to address balance for 8 more visits    Person(s) Educated Patient    Methods Explanation    Comprehension Verbalized understanding              PT Short Term Goals - 10/07/21 0845       PT SHORT TERM GOAL #1   Title Pt will demonstrate negative positional testing    Baseline L posterior canal BPPV    Time 3    Period Weeks    Status Achieved    Target Date 10/21/21               PT Long Term Goals - 11/13/21 1123       PT LONG TERM GOAL #1   Title Pt will demonstrate independence with HEP    Time 6    Period Weeks    Status Achieved    Target Date 11/11/21      PT LONG TERM GOAL #2   Title Pt will report no dizziness during any of his work tasks    Baseline Reports improvement with dizziness when stepping up on to step stool    Time 6    Period Weeks    Status Achieved    Target Date 11/11/21      PT LONG TERM GOAL #3   Title Pt will be able to maintain his balance for 30 sec in condition 4 of mCTSIB to demo improved vestibular integration    Baseline 20 sec    Time 6    Period Weeks    Status Partially Met    Target Date 11/11/21      PT LONG TERM GOAL #4   Title Pt will have improved FGA score to at least 22/30 to demo decreased fall risk    Baseline 13/30 > 16/30    Time 3    Period Weeks    Status Partially Met    Target Date 11/11/21      PT LONG TERM GOAL #5   Title Pt will have increased FOTO score to  at least 57    Baseline 39 > 53    Time 6    Period Weeks    Status Partially Met    Target Date 11/11/21            New goals for Recertification:   PT Short Term Goals - 11/13/21 1130       PT SHORT TERM GOAL #1   Title = LTG             PT Long Term Goals - 11/13/21 1130       PT LONG TERM GOAL #1   Title Pt will demonstrate independence with updated balance HEP  (LTG set at 4 weeks but POC is 8 weeks, can extend date out another 4 weeks if needed to 01/12/22)    Time 4    Period Weeks    Status Revised    Target Date 12/13/21      PT LONG TERM GOAL #2   Title Pt will report no dizziness during any of his work tasks    Baseline Reports improvement with dizziness when  stepping up on to step stool    Status Achieved      PT LONG TERM GOAL #3   Title Pt will be able to maintain his balance for 30 sec in condition 4 of mCTSIB to demo improved vestibular integration    Baseline 20 sec    Time 4    Period Weeks    Status Revised    Target Date 12/13/21      PT LONG TERM GOAL #4   Title Pt will have improved FGA score to at least 22/30 to demo decreased fall risk    Baseline 13/30 > 16/30    Time 4    Period Weeks    Status Revised    Target Date 12/13/21      PT LONG TERM GOAL #5   Title Pt will have increased FOTO score to at least 57    Baseline 39 > 53    Time 4    Period Weeks    Status Revised    Target Date 12/13/21              Plan - 11/13/21 1125     Clinical Impression Statement Performed assessment of progress towards LTG.  Pt is making steady progress and has met 2/5 LTG.  Pt no longer presents with vertigo and has demonstrated complete resolution of R BPPV.  Pt also demonstrates no ongoing motion sensitivity and reports improvement in symptoms when performing work tasks.  Pt has partially met and has made progress towards FOTO, FGA, and MCTSIB goals.  Pt continues to present with impaired static and dynamic balance and impaired gait; pt  continues to be at increased risk for falls and would benefit from continued skilled PT services to address ongoing impairments to further reduce falls risk and to continue to progress towards unmet goals.    Personal Factors and Comorbidities Comorbidity 3+;Fitness;Past/Current Experience    Comorbidities BPPV, fall, OA, atrial fibrillation, dysrhythmia, GERD, glaucoma, hearing loss, h/o kidney stones, h/o stomach ulcers, h/o migraines, HTN, peripheral neuropathy, OSA, cataract extraction, R rotator cuff surgery, L THA    Examination-Activity Limitations Bed Mobility;Bend;Stand;Locomotion Level;Transfers;Hygiene/Grooming    Examination-Participation Restrictions Community Activity;Occupation    Stability/Clinical Decision Making Stable/Uncomplicated    Rehab Potential Good    PT Frequency 1x / week    PT Duration 8 weeks    PT Treatment/Interventions ADLs/Self Care Home Management;Canalith Repostioning;Functional mobility training;Therapeutic activities;Therapeutic exercise;Balance training;Neuromuscular re-education;Patient/family education;Vestibular;Aquatic Therapy;DME Instruction;Gait training;Stair training;Dry needling;Passive range of motion;Manual techniques    PT Next Visit Plan Dynamic step ups with head turns, ROCKERBOARD with reaching up, etc.  Continue to work on static and dynamic balance on compliant surface using hip reaction-wall bumps, balance reactions, weight shifting and recovery.    PT Home Exercise Plan [id number]    Consulted and Agree with Plan of Care Patient             Patient will benefit from skilled therapeutic intervention in order to improve the following deficits and impairments:  Decreased balance, Dizziness, Abnormal gait, Difficulty walking, Decreased mobility  Visit Diagnosis: Dizziness and giddiness  Unsteadiness on feet  Other abnormalities of gait and mobility  BPPV (benign paroxysmal positional vertigo), right     Problem List Patient Active  Problem List   Diagnosis Date Noted   SOB (shortness of breath) 11/05/2021   Total knee replacement status, right 12/17/2019   Osteoarthritis of right knee 12/13/2019   Essential hypertension 12/04/2018   Sleep apnea 12/04/2018  Chronic anticoagulation 12/04/2018   IgM lambda monoclonal gammopathy 01/25/2018   Peripheral neuropathy 12/26/2017   Primary osteoarthritis of left hip 10/25/2015   Atrial fibrillation, chronic 08/26/2015    Rico Junker, PT, DPT 11/13/21    11:32 AM    [facility] [address] [place], [place], [address] Phone: [phone]   Fax:  [phone]  Name: Glen Bolton MRN: [id number] Date of Birth: 09-Jan-1941

## 2021-11-18 ENCOUNTER — Other Ambulatory Visit: Payer: Self-pay

## 2021-11-18 ENCOUNTER — Ambulatory Visit (INDEPENDENT_AMBULATORY_CARE_PROVIDER_SITE_OTHER): Payer: Medicare Other

## 2021-11-18 DIAGNOSIS — R0602 Shortness of breath: Secondary | ICD-10-CM

## 2021-11-18 LAB — ECHOCARDIOGRAM COMPLETE
AR max vel: 1.99 cm2
AV Area VTI: 1.74 cm2
AV Area mean vel: 1.92 cm2
AV Mean grad: 3 mmHg
AV Peak grad: 5.5 mmHg
Ao pk vel: 1.17 m/s
Area-P 1/2: 5.06 cm2
MV M vel: 4.51 m/s
MV Peak grad: 81.4 mmHg
S' Lateral: 3.4 cm

## 2021-11-19 ENCOUNTER — Ambulatory Visit: Payer: Medicare Other | Admitting: Physical Therapy

## 2021-11-19 DIAGNOSIS — R2681 Unsteadiness on feet: Secondary | ICD-10-CM | POA: Diagnosis not present

## 2021-11-19 DIAGNOSIS — R42 Dizziness and giddiness: Secondary | ICD-10-CM | POA: Diagnosis not present

## 2021-11-19 DIAGNOSIS — H8111 Benign paroxysmal vertigo, right ear: Secondary | ICD-10-CM | POA: Diagnosis not present

## 2021-11-19 DIAGNOSIS — R2689 Other abnormalities of gait and mobility: Secondary | ICD-10-CM | POA: Diagnosis not present

## 2021-11-19 NOTE — Therapy (Signed)
[place] ?[place] ?[place], [place], [address] ?Phone: [phone]   Fax:  [phone] ? ?Physical Therapy Treatment ? ?Patient Details  ?Name: Glen Bolton ?MRN: [id number] ?Date of Birth: Jan 30, 1941 ?Referring Provider (PT): Koirala, Dibas, MD ? ? ?Encounter Date: 11/19/2021 ? ? ? PT End of Session - 11/19/21 0901   ? ? Visit Number 10   ? Number of Visits 17   ? Date for PT Re-Evaluation 01/12/22   ? Authorization Type [employer] Medicare  (10th visit note performed on visit 9)   ? Progress Note Due on Visit 19   ? PT Start Time 0900   PT late  ? PT Stop Time 0930   pt had to leave for work  ? PT Time Calculation (min) 30 min   ? Activity Tolerance Patient tolerated treatment well   ? Behavior During Therapy [facility] for tasks assessed/performed   ? ?  ?  ? ?  ? ? ?Past Medical History:  ?Diagnosis Date  ? Anxiety   ? Arthritis   ? Atrial fibrillation, chronic 08/26/2015  ? Borderline diabetic   ? Chronic anticoagulation 12/04/2018  ? CHADS VASC-3-Xarelto  ? GERD (gastroesophageal reflux disease)   ? Glaucoma   ? Hard of hearing   ? History of gastritis   ? History of kidney stones   ? History of stomach ulcers   ? Hx of migraines   ? no migraines since he was in his 47's  ? Hypertension   ? Osteoarthritis of right knee 12/13/2019  ? Peripheral neuropathy 12/26/2017  ? Primary osteoarthritis of left hip 10/25/2015  ? Sleep apnea   ? CPAP  not currently working  couldnt breathe with it laying down trying something different end of  April or May 2021  ? ? ?Past Surgical History:  ?Procedure Laterality Date  ? CATARACT EXTRACTION Bilateral 2014  ? COLONOSCOPY    ? ESOPHAGOGASTRODUODENOSCOPY    ? EYE SURGERY    ? INGUINAL HERNIA REPAIR Left   ? x 2  ? Rotator cuff surgery Right 2013  ? TOTAL HIP ARTHROPLASTY Left 10/27/2015  ? Procedure: TOTAL HIP ARTHROPLASTY ANTERIOR APPROACH;  Surgeon: Frederik Pear, MD;  Location: [place];  Service: Orthopedics;  Laterality: Left;  ?  TOTAL KNEE ARTHROPLASTY Right 12/17/2019  ? Procedure: RIGHT TOTAL KNEE ARTHROPLASTY;  Surgeon: Frederik Pear, MD;  [facility];  Service: Orthopedics;  Laterality: Right;  ? ? ?There were no vitals filed for this visit. ? ? ? Subjective Assessment - 11/19/21 0903   ? ? Subjective BP has still been good.   Eye is better but still having to do drops.  Balance is getting better.   ? Pertinent History h/o BPPV, OA, atrial fibrillation, dysrhythmia, GERD, glaucoma, hearing loss, h/o kidney stones, h/o stomach ulcers, h/o migraines, HTN, peripheral neuropathy, OSA, cataract extraction, R rotator cuff surgery, L THA   ? Patient Stated Goals To stop falls   ? Currently in Pain? No/denies   ? ?  ?  ? ?  ? ? ? 11/19/21 0911  ?Balance Exercises: Standing  ?Rockerboard Anterior/posterior;EO;10 reps;Limitations  ?Rockerboard Limitations Ant/Post rocking back and forth x 10 reps focusing on increased weight shift forwards.  Performed alternating reaching up overhead x 10 reps with head/gaze forwards, 10 reps alternating reaching with one UE with looking up, 10 reps bilat UE reaching overhead with looking up with min A from PT for balance and safety.  Standing on rockerboard, PT  added in anterior and posterior perturbations with pt using ankle and hips to return to balance in neutral; intermittent UE support and mod A needed to prevent fall posteriorly.  ?Step Ups Forward;6 inch;UE support 2;Intermittent UE support;Limitations  ?Step Ups Limitations performed 2 sets x 8 reps forward step up with contralateral LE tap to next step with ipsilateral head turns beginning with bilat UE support > intermittent finger tip touch support.  Performed one set of alternating step up and alternating head turn L and R with light finger tip touch for support x 4 reps each side.  ? ? ? ? PT Short Term Goals - 11/13/21 1130   ? ?  ? PT SHORT TERM GOAL #1  ? Title = LTG   ? ?  ?  ? ?  ? ? ? ? PT Long Term Goals - 11/13/21 1130   ? ?  ? PT LONG  TERM GOAL #1  ? Title Pt will demonstrate independence with updated balance HEP  (LTG set at 4 weeks but POC is 8 weeks, can extend date out another 4 weeks if needed to 01/12/22)   ? Time 4   ? Period Weeks   ? Status Revised   ? Target Date 12/13/21   ?  ? PT LONG TERM GOAL #2  ? Title Pt will report no dizziness during any of his work tasks   ? Baseline Reports improvement with dizziness when stepping up on to step stool   ? Status Achieved   ?  ? PT LONG TERM GOAL #3  ? Title Pt will be able to maintain his balance for 30 sec in condition 4 of mCTSIB to demo improved vestibular integration   ? Baseline 20 sec   ? Time 4   ? Period Weeks   ? Status Revised   ? Target Date 12/13/21   ?  ? PT LONG TERM GOAL #4  ? Title Pt will have improved FGA score to at least 22/30 to demo decreased fall risk   ? Baseline 13/30 > 16/30   ? Time 4   ? Period Weeks   ? Status Revised   ? Target Date 12/13/21   ?  ? PT LONG TERM GOAL #5  ? Title Pt will have increased FOTO score to at least 57   ? Baseline 39 > 53   ? Time 4   ? Period Weeks   ? Status Revised   ? Target Date 12/13/21   ? ?  ?  ? ?  ? ? ? Plan - 11/19/21 0939   ? ? Clinical Impression Statement Continued to utilize rockerboard and step ups with head turns to focus on training ankle and hip balance strategies, promote greater anterior weight shifting and simulate work activities.  Pt continues to have increased difficulty with anterior weight shifts and tends to lose balance posteriorly.  Will continue to address and progress towards LTG.   ? Personal Factors and Comorbidities Comorbidity 3+;Fitness;Past/Current Experience   ? Comorbidities BPPV, fall, OA, atrial fibrillation, dysrhythmia, GERD, glaucoma, hearing loss, h/o kidney stones, h/o stomach ulcers, h/o migraines, HTN, peripheral neuropathy, OSA, cataract extraction, R rotator cuff surgery, L THA   ? Examination-Activity Limitations Bed Mobility;Bend;Stand;Locomotion Level;Transfers;Hygiene/Grooming   ?  Examination-Participation Restrictions Community Activity;Occupation   ? Stability/Clinical Decision Making Stable/Uncomplicated   ? Rehab Potential Good   ? PT Frequency 1x / week   ? PT Duration 8 weeks   ? PT Treatment/Interventions ADLs/Self Care  Home Management;Canalith Repostioning;Functional mobility training;Therapeutic activities;Therapeutic exercise;Balance training;Neuromuscular re-education;Patient/family education;Vestibular;Aquatic Therapy;DME Instruction;Gait training;Stair training;Dry needling;Passive range of motion;Manual techniques   ? PT Next Visit Plan Dynamic step ups with head turns, ROCKERBOARD with reaching up, etc.  Continue to work on static and dynamic balance on compliant surface using hip reaction-wall bumps, balance reactions, weight shifting and recovery.   ? PT Home Exercise Plan [email or web address]   ? Consulted and Agree with Plan of Care Patient   ? ?  ?  ? ?  ? ? ? ?Patient will benefit from skilled therapeutic intervention in order to improve the following deficits and impairments:  Decreased balance, Dizziness, Abnormal gait, Difficulty walking, Decreased mobility ? ?Visit Diagnosis: ?Dizziness and giddiness ? ?Unsteadiness on feet ? ?Other abnormalities of gait and mobility ? ? ? ? ?Problem List ?Patient Active Problem List  ? Diagnosis Date Noted  ? SOB (shortness of breath) 11/05/2021  ? Total knee replacement status, right 12/17/2019  ? Osteoarthritis of right knee 12/13/2019  ? Essential hypertension 12/04/2018  ? Sleep apnea 12/04/2018  ? Chronic anticoagulation 12/04/2018  ? IgM lambda monoclonal gammopathy 01/25/2018  ? Peripheral neuropathy 12/26/2017  ? Primary osteoarthritis of left hip 10/25/2015  ? Atrial fibrillation, chronic 08/26/2015  ? ? ?Rico Junker, PT, DPT ?11/19/21    9:42 AM ? ? ? ?[place] ?[place] ?[place], [place], [address] ?Phone: [phone]   Fax:  [phone] ? ?Name: Glen Bolton ?MRN:  [id number] ?Date of Birth: 07-14-1941 ? ? ? ?

## 2021-11-20 ENCOUNTER — Ambulatory Visit: Payer: Medicare Other | Admitting: Physical Therapy

## 2021-11-23 ENCOUNTER — Ambulatory Visit: Payer: Medicare Other | Admitting: Physical Therapy

## 2021-11-25 DIAGNOSIS — E7800 Pure hypercholesterolemia, unspecified: Secondary | ICD-10-CM | POA: Diagnosis not present

## 2021-11-25 DIAGNOSIS — I10 Essential (primary) hypertension: Secondary | ICD-10-CM | POA: Diagnosis not present

## 2021-11-25 DIAGNOSIS — E1169 Type 2 diabetes mellitus with other specified complication: Secondary | ICD-10-CM | POA: Diagnosis not present

## 2021-11-25 DIAGNOSIS — I4891 Unspecified atrial fibrillation: Secondary | ICD-10-CM | POA: Diagnosis not present

## 2021-11-26 ENCOUNTER — Ambulatory Visit: Payer: Medicare Other | Admitting: Physical Therapy

## 2021-11-26 ENCOUNTER — Encounter: Payer: Self-pay | Admitting: Physical Therapy

## 2021-11-26 ENCOUNTER — Other Ambulatory Visit: Payer: Self-pay

## 2021-11-26 DIAGNOSIS — H8111 Benign paroxysmal vertigo, right ear: Secondary | ICD-10-CM | POA: Diagnosis not present

## 2021-11-26 DIAGNOSIS — R2689 Other abnormalities of gait and mobility: Secondary | ICD-10-CM | POA: Diagnosis not present

## 2021-11-26 DIAGNOSIS — R42 Dizziness and giddiness: Secondary | ICD-10-CM | POA: Diagnosis not present

## 2021-11-26 DIAGNOSIS — R2681 Unsteadiness on feet: Secondary | ICD-10-CM | POA: Diagnosis not present

## 2021-11-26 NOTE — Therapy (Signed)
[facility] ?[place] ?[place], [place], [address] ?Phone: [phone]   Fax:  [phone] ? ?Physical Therapy Treatment ? ?Patient Details  ?Name: Glen Bolton ?MRN: [id number] ?Date of Birth: [birth year] ?Referring Provider (PT): Koirala, Dibas, MD ? ? ?Encounter Date: 11/26/2021 ? ? PT End of Session - 11/26/21 0911   ? ? Visit Number 11   ? Number of Visits 17   ? Date for PT Re-Evaluation 01/12/22   ? Authorization Type [employer] Medicare  (10th visit note performed on visit 9)   ? Progress Note Due on Visit 19   ? PT Start Time 0805   ? PT Stop Time 6378   ? PT Time Calculation (min) 41 min   ? Activity Tolerance Patient tolerated treatment well   ? Behavior During Therapy [facility] for tasks assessed/performed   ? ?  ?  ? ?  ? ? ?Past Medical History:  ?Diagnosis Date  ? Anxiety   ? Arthritis   ? Atrial fibrillation, chronic 08/26/2015  ? Borderline diabetic   ? Chronic anticoagulation 12/04/2018  ? CHADS VASC-3-Xarelto  ? GERD (gastroesophageal reflux disease)   ? Glaucoma   ? Hard of hearing   ? History of gastritis   ? History of kidney stones   ? History of stomach ulcers   ? Hx of migraines   ? no migraines since he was in his 30's  ? Hypertension   ? Osteoarthritis of right knee 12/13/2019  ? Peripheral neuropathy 12/26/2017  ? Primary osteoarthritis of left hip 10/25/2015  ? Sleep apnea   ? CPAP  not currently working  couldnt breathe with it laying down trying something different end of  April or May 2021  ? ? ?Past Surgical History:  ?Procedure Laterality Date  ? CATARACT EXTRACTION Bilateral 2014  ? COLONOSCOPY    ? ESOPHAGOGASTRODUODENOSCOPY    ? EYE SURGERY    ? INGUINAL HERNIA REPAIR Left   ? x 2  ? Rotator cuff surgery Right 2013  ? TOTAL HIP ARTHROPLASTY Left 10/27/2015  ? Procedure: TOTAL HIP ARTHROPLASTY ANTERIOR APPROACH;  Surgeon: Frederik Pear, MD;  Location: [place];  Service: Orthopedics;  Laterality: Left;  ? TOTAL KNEE ARTHROPLASTY Right  12/17/2019  ? Procedure: RIGHT TOTAL KNEE ARTHROPLASTY;  Surgeon: Frederik Pear, MD;  [facility];  Service: Orthopedics;  Laterality: Right;  ? ? ?There were no vitals filed for this visit. ? ? Subjective Assessment - 11/26/21 0809   ? ? Subjective Pt reports no changes since session last week - continues to have difficulty keeping balance with eyes closed.  Pt does report he mowed his yard this week (used Chiropractor) and did OK   ? Pertinent History h/o BPPV, OA, atrial fibrillation, dysrhythmia, GERD, glaucoma, hearing loss, h/o kidney stones, h/o stomach ulcers, h/o migraines, HTN, peripheral neuropathy, OSA, cataract extraction, R rotator cuff surgery, L THA   ? Patient Stated Goals To stop falls   ? Currently in Pain? No/denies   ? ?  ?  ? ?  ? ? ? ? ? ? ? ? ? ? ? ? ? ? ? ? ? ? ? ? [place] Adult PT Treatment/Exercise - 11/26/21 0001   ? ?  ? Transfers  ? Transfers Sit to Stand;Stand to Sit   ? Sit to Stand 4: Min guard   ? Number of Reps 10 reps   5 reps with EO with feet on Airex:  5 reps with EC  with feet on Airex with CGA for safety with LOB  ?  ? Ambulation/Gait  ? Ambulation/Gait Yes   ? Ambulation/Gait Assistance 5: Supervision   ? Ambulation Distance (Feet) 40 Feet   ? Assistive device None   ? Gait Pattern Step-through pattern   ? Ambulation Surface Level;Indoor   ? Gait Comments 20' with horizontal head turns, 20' with vertical head turns with CGA   ?  ? Knee/Hip Exercises: Standing  ? Heel Raises Both;1 set;10 reps   min. UE support used on // bars  ? ?  ?  ? ?  ? ? ? ? ? ? Balance Exercises - 11/26/21 0001   ? ?  ? Balance Exercises: Standing  ? Standing Eyes Closed Narrow base of support (BOS);Wide (BOA);Head turns;Foam/compliant surface;5 reps   horizontal and vertical head turns - standing on Airex and on foam balance beam with EO and EC 10 sec hold each without head turns, then added head turns with EO and EC 5 reps each  ? Rockerboard Anterior/posterior;Head turns;EO;EC;5 reps   pt stood on  rockerboard inside // bars -held board steady with EO for 10 secs, then held steady with EC for 10 secs; added head turns EO side to side 5 reps, up/down 5 reps and then performed with EC side to side 5 reps and up/down 5 reps with UE support  ? Balance Beam standing perpendicular on beam with LUE support prn - EO and EC and then added head turns horizontally and vertically 5 reps each with EO and EC   ? Other Standing Exercises Pt performed marching on Airex - inside // bars with UE support prn; EO and then EC ; performed horizontal head turns with EO 5 reps and vertical 5 reps with EO; no head turns performed with marching with EC on Airex   ? Other Standing Exercises Comments Pt performed cone taps standing on floor to 2 cones 5 reps straight ahead; diagonals 3 reps each foot; then progressed to tipping cone over and standing upright 2 reps each foot with min assist needed for recovery of LOB due to SLS deficits(standing on floor)   ? ?  ?  ? ?  ? ? ? ? ? ? ? PT Short Term Goals - 11/13/21 1130   ? ?  ? PT SHORT TERM GOAL #1  ? Title = LTG   ? ?  ?  ? ?  ? ? ? ? PT Long Term Goals - 11/26/21 0914   ? ?  ? PT LONG TERM GOAL #1  ? Title Pt will demonstrate independence with updated balance HEP  (LTG set at 4 weeks but POC is 8 weeks, can extend date out another 4 weeks if needed to 01/12/22)   ? Time 4   ? Period Weeks   ? Status Revised   ? Target Date 12/13/21   ?  ? PT LONG TERM GOAL #2  ? Title Pt will report no dizziness during any of his work tasks   ? Baseline Reports improvement with dizziness when stepping up on to step stool   ? Status Achieved   ?  ? PT LONG TERM GOAL #3  ? Title Pt will be able to maintain his balance for 30 sec in condition 4 of mCTSIB to demo improved vestibular integration   ? Baseline 20 sec   ? Time 4   ? Period Weeks   ? Status Revised   ? Target Date 12/13/21   ?  ?  PT LONG TERM GOAL #4  ? Title Pt will have improved FGA score to at least 22/30 to demo decreased fall risk   ?  Baseline 13/30 > 16/30   ? Time 4   ? Period Weeks   ? Status Revised   ? Target Date 12/13/21   ?  ? PT LONG TERM GOAL #5  ? Title Pt will have increased FOTO score to at least 57   ? Baseline 39 > 53   ? Time 4   ? Period Weeks   ? Status Revised   ? Target Date 12/13/21   ? ?  ?  ? ?  ? ? ? ? ? ? ? ? Plan - 11/26/21 0911   ? ? Clinical Impression Statement Pt has most difficulty with maintaining balance on compliant surface with EC, indicative of decreased vestibular input in maintaining balance.  Pt also has moderate sway/postural instability with amb. with horizontal head turns with need for CGA for balance recovery.  Pt able to perform step and pivot turn to each side standing on floor with no major LOB.  Cont with POC.   ? Personal Factors and Comorbidities Comorbidity 3+;Fitness;Past/Current Experience   ? Comorbidities BPPV, fall, OA, atrial fibrillation, dysrhythmia, GERD, glaucoma, hearing loss, h/o kidney stones, h/o stomach ulcers, h/o migraines, HTN, peripheral neuropathy, OSA, cataract extraction, R rotator cuff surgery, L THA   ? Examination-Activity Limitations Bed Mobility;Bend;Stand;Locomotion Level;Transfers;Hygiene/Grooming   ? Examination-Participation Restrictions Community Activity;Occupation   ? Stability/Clinical Decision Making Stable/Uncomplicated   ? Rehab Potential Good   ? PT Frequency 1x / week   ? PT Duration 8 weeks   ? PT Treatment/Interventions ADLs/Self Care Home Management;Canalith Repostioning;Functional mobility training;Therapeutic activities;Therapeutic exercise;Balance training;Neuromuscular re-education;Patient/family education;Vestibular;Aquatic Therapy;DME Instruction;Gait training;Stair training;Dry needling;Passive range of motion;Manual techniques   ? PT Next Visit Plan Continue static & dynamic standing balance on compliant surfaces - exercises to increase vestibular input in balance   ? PT Home Exercise Plan [id number]   ? Consulted and Agree with Plan of Care Patient    ? ?  ?  ? ?  ? ? ?Patient will benefit from skilled therapeutic intervention in order to improve the following deficits and impairments:  Decreased balance, Dizziness, Abnormal gait, Difficulty walking, Decreased

## 2021-12-02 DIAGNOSIS — H401133 Primary open-angle glaucoma, bilateral, severe stage: Secondary | ICD-10-CM | POA: Diagnosis not present

## 2021-12-03 ENCOUNTER — Ambulatory Visit: Payer: Medicare Other | Admitting: Physical Therapy

## 2021-12-10 ENCOUNTER — Ambulatory Visit: Payer: Medicare Other | Attending: Family Medicine | Admitting: Physical Therapy

## 2021-12-10 DIAGNOSIS — R2689 Other abnormalities of gait and mobility: Secondary | ICD-10-CM | POA: Diagnosis not present

## 2021-12-10 DIAGNOSIS — R2681 Unsteadiness on feet: Secondary | ICD-10-CM | POA: Insufficient documentation

## 2021-12-11 ENCOUNTER — Encounter: Payer: Self-pay | Admitting: Physical Therapy

## 2021-12-11 NOTE — Therapy (Signed)
[place] ?[place] ?[place], [place], [address] ?Phone: [phone]   Fax:  [phone] ? ?Physical Therapy Treatment ? ?Patient Details  ?Name: Glen Bolton ?MRN: [id number] ?Date of Birth: 01/16/1941 ?Referring Provider (PT): Koirala, Dibas, MD ? ? ?Encounter Date: 12/10/2021 ? ? PT End of Session - 12/11/21 1152   ? ? Visit Number 12   ? Number of Visits 17   ? Date for PT Re-Evaluation 01/12/22   ? Authorization Type [employer] Medicare  (10th visit note performed on visit 9)   ? Progress Note Due on Visit 19   ? PT Start Time 0801   ? PT Stop Time [phone]   pt requested to leave early due to having another appt  ? PT Time Calculation (min) 34 min   ? Activity Tolerance Patient tolerated treatment well   ? Behavior During Therapy [facility] for tasks assessed/performed   ? ?  ?  ? ?  ? ? ?Past Medical History:  ?Diagnosis Date  ? Anxiety   ? Arthritis   ? Atrial fibrillation, chronic 08/26/2015  ? Borderline diabetic   ? Chronic anticoagulation 12/04/2018  ? CHADS VASC-3-Xarelto  ? GERD (gastroesophageal reflux disease)   ? Glaucoma   ? Hard of hearing   ? History of gastritis   ? History of kidney stones   ? History of stomach ulcers   ? Hx of migraines   ? no migraines since he was in his 34's  ? Hypertension   ? Osteoarthritis of right knee 12/13/2019  ? Peripheral neuropathy 12/26/2017  ? Primary osteoarthritis of left hip 10/25/2015  ? Sleep apnea   ? CPAP  not currently working  couldnt breathe with it laying down trying something different end of  April or May 2021  ? ? ?Past Surgical History:  ?Procedure Laterality Date  ? CATARACT EXTRACTION Bilateral 2014  ? COLONOSCOPY    ? ESOPHAGOGASTRODUODENOSCOPY    ? EYE SURGERY    ? INGUINAL HERNIA REPAIR Left   ? x 2  ? Rotator cuff surgery Right 2013  ? TOTAL HIP ARTHROPLASTY Left 10/27/2015  ? Procedure: TOTAL HIP ARTHROPLASTY ANTERIOR APPROACH;  Surgeon: Frederik Pear, MD;  Location: [facility];  Service: Orthopedics;   Laterality: Left;  ? TOTAL KNEE ARTHROPLASTY Right 12/17/2019  ? Procedure: RIGHT TOTAL KNEE ARTHROPLASTY;  Surgeon: Frederik Pear, MD;  [facility];  Service: Orthopedics;  Laterality: Right;  ? ? ?There were no vitals filed for this visit. ? ? Subjective Assessment - 12/11/21 1145   ? ? Subjective Pt reports no changes or problems - reports balance seems to be a little better overall   ? Pertinent History h/o BPPV, OA, atrial fibrillation, dysrhythmia, GERD, glaucoma, hearing loss, h/o kidney stones, h/o stomach ulcers, h/o migraines, HTN, peripheral neuropathy, OSA, cataract extraction, R rotator cuff surgery, L THA   ? Patient Stated Goals To stop falls   ? Currently in Pain? No/denies   ? ?  ?  ? ?  ? ? ? ? ? ? ? ? ? ? ? ? ? ? ? ? ? ? ? ? [place] Adult PT Treatment/Exercise - 12/11/21 0001   ? ?  ? Transfers  ? Transfers Sit to Stand;Stand to Sit   ? Sit to Stand 4: Min guard   ? Number of Reps Other reps (comment)   3 reps feet on floor; 5 reps feet on AIrex without UE support  ?  ? Ambulation/Gait  ?  Ambulation/Gait Yes   ? Ambulation/Gait Assistance 4: Min guard   ? Ambulation Distance (Feet) 70 Feet   35' x 2 reps with horizontal head turns  ? Assistive device None   ? Gait Pattern Step-through pattern   ? Ambulation Surface Indoor   ? Gait Comments 30' x 1 rep with vertical head turns   ?  ? Neuro Re-ed   ? Neuro Re-ed Details  sidestepping x 2 reps on blue foam balance beam with minimal UE support; tandem stance on floor inside // bars 10' x 2 reps with UE support prn; partial tandem stance on blue beam inside bars x 2 reps with UE support prn   ?  ? Knee/Hip Exercises: Standing  ? Other Standing Knee Exercises sidestepping with squats inside // bars 10' x 2 reps without UE support   ? ?  ?  ? ?  ? ? ? ? ? ? Balance Exercises - 12/11/21 0001   ? ?  ? Balance Exercises: Standing  ? Standing Eyes Closed Narrow base of support (BOS);Wide (BOA);Head turns;Foam/compliant surface;5 reps   horizontal and  vertical head turns - standing on Airex and on foam balance beam with EO and EC 10 sec hold each without head turns, then added head turns with EO and EC 5 reps each  ? Rockerboard Anterior/posterior;Head turns;EO;EC;5 reps   pt stood on rockerboard inside // bars -held board steady with EO for 10 secs, then held steady with EC for 10 secs; added head turns EO side to side 5 reps, up/down 5 reps and then performed with EC side to side 5 reps and up/down 5 reps with UE support  ? Balance Beam standing perpendicular on beam with LUE support prn - EO and EC and then added head turns horizontally and vertically 5 reps each with EO and EC   ? Other Standing Exercises Pt performed marching on Airex - inside // bars with UE support prn; EO and then EC ; performed horizontal head turns with EO 5 reps and vertical 5 reps with EO; EC - marching only 10 reps each without head turns with UE support prn for balance recovery   ? ?  ?  ? ?  ? ? ? ? ? ? ? PT Short Term Goals - 11/13/21 1130   ? ?  ? PT SHORT TERM GOAL #1  ? Title = LTG   ? ?  ?  ? ?  ? ? ? ? PT Long Term Goals - 12/11/21 1156   ? ?  ? PT LONG TERM GOAL #1  ? Title Pt will demonstrate independence with updated balance HEP  (LTG set at 4 weeks but POC is 8 weeks, can extend date out another 4 weeks if needed to 01/12/22)   ? Time 4   ? Period Weeks   ? Status Revised   ? Target Date 12/13/21   ?  ? PT LONG TERM GOAL #2  ? Title Pt will report no dizziness during any of his work tasks   ? Baseline Reports improvement with dizziness when stepping up on to step stool   ? Status Achieved   ?  ? PT LONG TERM GOAL #3  ? Title Pt will be able to maintain his balance for 30 sec in condition 4 of mCTSIB to demo improved vestibular integration   ? Baseline 20 sec   ? Time 4   ? Period Weeks   ? Status Revised   ?  Target Date 12/13/21   ?  ? PT LONG TERM GOAL #4  ? Title Pt will have improved FGA score to at least 22/30 to demo decreased fall risk   ? Baseline 13/30 > 16/30   ?  Time 4   ? Period Weeks   ? Status Revised   ? Target Date 12/13/21   ?  ? PT LONG TERM GOAL #5  ? Title Pt will have increased FOTO score to at least 57   ? Baseline 39 > 53   ? Time 4   ? Period Weeks   ? Status Revised   ? Target Date 12/13/21   ? ?  ?  ? ?  ? ? ? ? ? ? ? ? Plan - 12/11/21 1153   ? ? Clinical Impression Statement Pt continues to have postural instability with maintaining balance on compliant surface with EC, indicative of vestibular hypofunction.  Balance with EO on firm surface is WFL's.  Pt is progressing towards LTG's.  Cont with POC.   ? Personal Factors and Comorbidities Comorbidity 3+;Fitness;Past/Current Experience   ? Comorbidities BPPV, fall, OA, atrial fibrillation, dysrhythmia, GERD, glaucoma, hearing loss, h/o kidney stones, h/o stomach ulcers, h/o migraines, HTN, peripheral neuropathy, OSA, cataract extraction, R rotator cuff surgery, L THA   ? Examination-Activity Limitations Bed Mobility;Bend;Stand;Locomotion Level;Transfers;Hygiene/Grooming   ? Examination-Participation Restrictions Community Activity;Occupation   ? Stability/Clinical Decision Making Stable/Uncomplicated   ? Rehab Potential Good   ? PT Frequency 1x / week   ? PT Duration 8 weeks   ? PT Treatment/Interventions ADLs/Self Care Home Management;Canalith Repostioning;Functional mobility training;Therapeutic activities;Therapeutic exercise;Balance training;Neuromuscular re-education;Patient/family education;Vestibular;Aquatic Therapy;DME Instruction;Gait training;Stair training;Dry needling;Passive range of motion;Manual techniques   ? PT Next Visit Plan Continue static & dynamic standing balance on compliant surfaces - exercises to increase vestibular input in balance   ? PT Home Exercise Plan [id number]   ? Consulted and Agree with Plan of Care Patient   ? ?  ?  ? ?  ? ? ?Patient will benefit from skilled therapeutic intervention in order to improve the following deficits and impairments:  Decreased balance, Dizziness,  Abnormal gait, Difficulty walking, Decreased mobility ? ?Visit Diagnosis: ?Unsteadiness on feet ? ?Other abnormalities of gait and mobility ? ? ? ? ?Problem List ?Patient Active Problem List  ? Diagnosis Date Note

## 2021-12-24 ENCOUNTER — Telehealth: Payer: Self-pay | Admitting: Hematology

## 2021-12-24 ENCOUNTER — Ambulatory Visit: Payer: Medicare Other | Admitting: Physical Therapy

## 2021-12-24 DIAGNOSIS — R2689 Other abnormalities of gait and mobility: Secondary | ICD-10-CM | POA: Diagnosis not present

## 2021-12-24 DIAGNOSIS — R2681 Unsteadiness on feet: Secondary | ICD-10-CM

## 2021-12-24 NOTE — Telephone Encounter (Signed)
Per provider reschedule called and spoke to pt about appointment changes pr confirmed appointment  ?

## 2021-12-25 NOTE — Therapy (Signed)
[facility] ?[address] ?[place], [place], [address] ?Phone: [phone]   Fax:  [phone] ? ?Physical Therapy Treatment ? ?Patient Details  ?Name: Glen Bolton ?MRN: [id number] ?Date of Birth: Jan 06, 1941 ?Referring Provider (PT): Koirala, Dibas, MD ? ? ?Encounter Date: 12/24/2021 ? ? PT End of Session - 12/25/21 0935   ? ? Visit Number 13   ? Number of Visits 17   ? Date for PT Re-Evaluation 01/12/22   ? Authorization Type [employer] Medicare  (10th visit note performed on visit 9)   ? Progress Note Due on Visit 19   ? PT Start Time 0802   ? PT Stop Time 7253   ? PT Time Calculation (min) 44 min   ? Equipment Utilized During Treatment Gait belt   ? Activity Tolerance Patient tolerated treatment well   ? Behavior During Therapy [facility] for tasks assessed/performed   ? ?  ?  ? ?  ? ? ?Past Medical History:  ?Diagnosis Date  ? Anxiety   ? Arthritis   ? Atrial fibrillation, chronic 08/26/2015  ? Borderline diabetic   ? Chronic anticoagulation 12/04/2018  ? CHADS VASC-3-Xarelto  ? GERD (gastroesophageal reflux disease)   ? Glaucoma   ? Hard of hearing   ? History of gastritis   ? History of kidney stones   ? History of stomach ulcers   ? Hx of migraines   ? no migraines since he was in his 38's  ? Hypertension   ? Osteoarthritis of right knee 12/13/2019  ? Peripheral neuropathy 12/26/2017  ? Primary osteoarthritis of left hip 10/25/2015  ? Sleep apnea   ? CPAP  not currently working  couldnt breathe with it laying down trying something different end of  April or May 2021  ? ? ?Past Surgical History:  ?Procedure Laterality Date  ? CATARACT EXTRACTION Bilateral 2014  ? COLONOSCOPY    ? ESOPHAGOGASTRODUODENOSCOPY    ? EYE SURGERY    ? INGUINAL HERNIA REPAIR Left   ? x 2  ? Rotator cuff surgery Right 2013  ? TOTAL HIP ARTHROPLASTY Left 10/27/2015  ? Procedure: TOTAL HIP ARTHROPLASTY ANTERIOR APPROACH;  Surgeon: Frederik Pear, MD;  Location: [facility];  Service: Orthopedics;   Laterality: Left;  ? TOTAL KNEE ARTHROPLASTY Right 12/17/2019  ? Procedure: RIGHT TOTAL KNEE ARTHROPLASTY;  Surgeon: Frederik Pear, MD;  Location: [facility];  Service: Orthopedics;  Laterality: Right;  ? ? ?There were no vitals filed for this visit. ? ? Subjective Assessment - 12/24/21 0808   ? ? Subjective Pt reports he played golf for first time in a year this week - states he noticed some mild balance deficits when he turned his head when hitting ball   ? Pertinent History h/o BPPV, OA, atrial fibrillation, dysrhythmia, GERD, glaucoma, hearing loss, h/o kidney stones, h/o stomach ulcers, h/o migraines, HTN, peripheral neuropathy, OSA, cataract extraction, R rotator cuff surgery, L THA   ? Patient Stated Goals To stop falls   ? Currently in Pain? Other (Comment)   "normal" pain - Rt ankle bothering him due to plantar fasciitis  ? ?  ?  ? ?  ? ? ? ? ? ? ? ? ? ? ? ? ? ? ? ? ? ? ? ? [facility] PT Treatment/Exercise - 12/25/21 0001   ? ?  ? Transfers  ? Transfers Sit to Stand;Stand to Sit   ? Sit to Stand 4: Min guard   ? Number of Reps  Other reps (comment)   5 reps  ? Comments feet on AIrex   ? ?  ?  ? ?  ? ? ? ? ? ? Balance Exercises - 12/25/21 0001   ? ?  ? Balance Exercises: Standing  ? Standing Eyes Opened Wide (BOA);Narrow base of support (BOS);Foam/compliant surface;5 reps   standing on blue mat - feet together - horizontal and vertical head tuns  ? Standing Eyes Closed Narrow base of support (BOS);Wide (BOA);Head turns;Foam/compliant surface;5 reps   horizontal and vertical head turns - standing on Airex and on foam balance beam with EO and EC 10 sec hold each without head turns, then added head turns with EO and EC 5 reps each  ? Rockerboard Anterior/posterior;Head turns;EO;EC;10 reps   pt stood on rockerboard inside // bars -held board steady with EO for 10 secs, then held steady with EC for 10 secs; added head turns EO side to side 5 reps, up/down 5 reps and then performed with EC side to side 5 reps and up/down  5 reps with UE support  ? Retro Gait 2 reps   20' x 2  ? Other Standing Exercises Pt performed marching on Airex - inside // bars with UE support prn; EO and then EC ; performed horizontal head turns with EO 5 reps and vertical 5 reps with EO; EC - marching only 10 reps each without head turns with UE support prn for balance recovery   ? Other Standing Exercises Comments Pt performed cone taps standing on floor to 2 cones 5 reps straight ahead; diagonals 3 reps each foot; then progressed to tipping cone over and standing upright 2 reps each foot with min assist needed for recovery of LOB due to SLS deficits(standing on floor)   ? ?  ?  ? ?  ? ? ? ? ? PT Education - 12/25/21 0938   ? ? Education Details pt informed of using frozen water bottle for massage/tissue mobilization to address plantar fasciitis   ? Person(s) Educated Patient   ? Methods Explanation   ? Comprehension Verbalized understanding   ? ?  ?  ? ?  ? ? ? PT Short Term Goals - 11/13/21 1130   ? ?  ? PT SHORT TERM GOAL #1  ? Title = LTG   ? ?  ?  ? ?  ? ? ? ? PT Long Term Goals - 12/25/21 0937   ? ?  ? PT LONG TERM GOAL #1  ? Title Pt will demonstrate independence with updated balance HEP  (LTG set at 4 weeks but POC is 8 weeks, can extend date out another 4 weeks if needed to 01/12/22)   ? Time 4   ? Period Weeks   ? Status Revised   ? Target Date 12/13/21   ?  ? PT LONG TERM GOAL #2  ? Title Pt will report no dizziness during any of his work tasks   ? Baseline Reports improvement with dizziness when stepping up on to step stool   ? Status Achieved   ?  ? PT LONG TERM GOAL #3  ? Title Pt will be able to maintain his balance for 30 sec in condition 4 of mCTSIB to demo improved vestibular integration   ? Baseline 20 sec   ? Time 4   ? Period Weeks   ? Status Revised   ? Target Date 12/13/21   ?  ? PT LONG TERM GOAL #4  ? Title Pt  will have improved FGA score to at least 22/30 to demo decreased fall risk   ? Baseline 13/30 > 16/30   ? Time 4   ? Period  Weeks   ? Status Revised   ? Target Date 12/13/21   ?  ? PT LONG TERM GOAL #5  ? Title Pt will have increased FOTO score to at least 57   ? Baseline 39 > 53   ? Time 4   ? Period Weeks   ? Status Revised   ? Target Date 12/13/21   ? ?  ?  ? ?  ? ? ? ? ? ? ? ? Plan - 12/25/21 0935   ? ? Clinical Impression Statement Pt has most difficulty maintaining balance with EC with standing on compliant surfaces, indicative of decreased vestibular input in maintaining balance.  Pt able to amb. on flat surface tracking ball both clockwise and counterclockwise without LOB, only occasional mild postural instability.  Plan D/C next session - pt agrees.   ? Personal Factors and Comorbidities Comorbidity 3+;Fitness;Past/Current Experience   ? Comorbidities BPPV, fall, OA, atrial fibrillation, dysrhythmia, GERD, glaucoma, hearing loss, h/o kidney stones, h/o stomach ulcers, h/o migraines, HTN, peripheral neuropathy, OSA, cataract extraction, R rotator cuff surgery, L THA   ? Examination-Activity Limitations Bed Mobility;Bend;Stand;Locomotion Level;Transfers;Hygiene/Grooming   ? Examination-Participation Restrictions Community Activity;Occupation   ? Stability/Clinical Decision Making Stable/Uncomplicated   ? Rehab Potential Good   ? PT Frequency 1x / week   ? PT Duration 8 weeks   ? PT Treatment/Interventions ADLs/Self Care Home Management;Canalith Repostioning;Functional mobility training;Therapeutic activities;Therapeutic exercise;Balance training;Neuromuscular re-education;Patient/family education;Vestibular;Aquatic Therapy;DME Instruction;Gait training;Stair training;Dry needling;Passive range of motion;Manual techniques   ? PT Next Visit Plan Check LTG's - D/C   ? PT Home Exercise Plan [id number]   ? Consulted and Agree with Plan of Care Patient   ? ?  ?  ? ?  ? ? ?Patient will benefit from skilled therapeutic intervention in order to improve the following deficits and impairments:  Decreased balance, Dizziness, Abnormal gait,  Difficulty walking, Decreased mobility ? ?Visit Diagnosis: ?Unsteadiness on feet ? ?Other abnormalities of gait and mobility ? ? ? ? ?Problem List ?Patient Active Problem List  ? Diagnosis Date Noted  ? SOB (shortnes

## 2021-12-27 ENCOUNTER — Other Ambulatory Visit: Payer: Self-pay | Admitting: Cardiology

## 2021-12-28 NOTE — Telephone Encounter (Signed)
Prescription refill request for Xarelto received.  ?Indication:Afib ?Last office visit:3/23 ?Weight:119.9 kg ?Age:81 ?Scr:0.9 ?CrCl:112/87 ml/min ? ?Prescription refilled ? ?

## 2021-12-31 ENCOUNTER — Ambulatory Visit: Payer: Medicare Other | Admitting: Physical Therapy

## 2021-12-31 DIAGNOSIS — R2689 Other abnormalities of gait and mobility: Secondary | ICD-10-CM

## 2021-12-31 DIAGNOSIS — R2681 Unsteadiness on feet: Secondary | ICD-10-CM

## 2021-12-31 NOTE — Therapy (Signed)
[facility] ?[place] ?[place], [place], [address] ?Phone: [phone]   Fax:  [phone] ? ?Physical Therapy Treatment & Discharge Summary ? ?Patient Details  ?Name: Glen Bolton ?MRN: [id number] ?Date of Birth: 1940/09/20 ?Referring Provider (PT): Koirala, Dibas, MD ? ? ?Encounter Date: 12/31/2021 ? ? PT End of Session - 12/31/21 1935   ? ? Visit Number 14   ? Number of Visits 17   ? Date for PT Re-Evaluation 01/12/22   ? Authorization Type [employer] Medicare  (10th visit note performed on visit 9)   ? Progress Note Due on Visit 19   ? PT Start Time 0800   ? PT Stop Time 0845   ? PT Time Calculation (min) 45 min   ? Equipment Utilized During Treatment Gait belt   ? Activity Tolerance Patient tolerated treatment well   ? Behavior During Therapy [facility] for tasks assessed/performed   ? ?  ?  ? ?  ? ? ?Past Medical History:  ?Diagnosis Date  ? Anxiety   ? Arthritis   ? Atrial fibrillation, chronic 08/26/2015  ? Borderline diabetic   ? Chronic anticoagulation 12/04/2018  ? CHADS VASC-3-Xarelto  ? GERD (gastroesophageal reflux disease)   ? Glaucoma   ? Hard of hearing   ? History of gastritis   ? History of kidney stones   ? History of stomach ulcers   ? Hx of migraines   ? no migraines since he was in his 60's  ? Hypertension   ? Osteoarthritis of right knee 12/13/2019  ? Peripheral neuropathy 12/26/2017  ? Primary osteoarthritis of left hip 10/25/2015  ? Sleep apnea   ? CPAP  not currently working  couldnt breathe with it laying down trying something different end of  April or May 2021  ? ? ?Past Surgical History:  ?Procedure Laterality Date  ? CATARACT EXTRACTION Bilateral 2014  ? COLONOSCOPY    ? ESOPHAGOGASTRODUODENOSCOPY    ? EYE SURGERY    ? INGUINAL HERNIA REPAIR Left   ? x 2  ? Rotator cuff surgery Right 2013  ? TOTAL HIP ARTHROPLASTY Left 10/27/2015  ? Procedure: TOTAL HIP ARTHROPLASTY ANTERIOR APPROACH;  Surgeon: Frederik Pear, MD;  Location: [facility];  Service:  Orthopedics;  Laterality: Left;  ? TOTAL KNEE ARTHROPLASTY Right 12/17/2019  ? Procedure: RIGHT TOTAL KNEE ARTHROPLASTY;  Surgeon: Frederik Pear, MD;  Location: [facility];  Service: Orthopedics;  Laterality: Right;  ? ? ?There were no vitals filed for this visit. ? ? Subjective Assessment - 12/31/21 0806   ? ? Subjective Pt states the plantar fasciitis is bothering him some - used Voltaren this morning; states he did play golf with his wife yesterday but continues to have difficulty keeping balance when he turns his head   ? Pertinent History h/o BPPV, OA, atrial fibrillation, dysrhythmia, GERD, glaucoma, hearing loss, h/o kidney stones, h/o stomach ulcers, h/o migraines, HTN, peripheral neuropathy, OSA, cataract extraction, R rotator cuff surgery, L THA   ? Patient Stated Goals To stop falls   ? Currently in Pain? Yes   ? Pain Score 2    ? Pain Location Foot   ? Pain Orientation Right   ? Pain Descriptors / Indicators Discomfort;Dull;Aching   ? Pain Type Chronic pain   ? Pain Onset More than a month ago   ? Pain Frequency Intermittent   ? Aggravating Factors  weight bearing and standing   ? Pain Relieving Factors rest - non-weight bearing   ? ?  ?  ? ?  ? ? ? ? ?  [facility] PT Assessment - 12/31/21 0001   ? ?  ? Functional Gait  Assessment  ? Gait assessed  Yes   ? Gait Level Surface Walks 20 ft, slow speed, abnormal gait pattern, evidence for imbalance or deviates 10-15 in outside of the 12 in walkway width. Requires more than 7 sec to ambulate 20 ft.   8.00 secs  ? Change in Gait Speed Able to change speed, demonstrates mild gait deviations, deviates 6-10 in outside of the 12 in walkway width, or no gait deviations, unable to achieve a major change in velocity, or uses a change in velocity, or uses an assistive device.   ? Gait with Horizontal Head Turns Performs head turns smoothly with slight change in gait velocity (eg, minor disruption to smooth gait path), deviates 6-10 in outside 12 in walkway width, or uses an  assistive device.   ? Gait with Vertical Head Turns Performs task with slight change in gait velocity (eg, minor disruption to smooth gait path), deviates 6 - 10 in outside 12 in walkway width or uses assistive device   ? Gait and Pivot Turn Pivot turns safely within 3 sec and stops quickly with no loss of balance.   ? Step Over Obstacle Is able to step over 2 stacked shoe boxes taped together (9 in total height) without changing gait speed. No evidence of imbalance.   ? Gait with Narrow Base of Support Ambulates less than 4 steps heel to toe or cannot perform without assistance.   ? Gait with Eyes Closed Walks 20 ft, uses assistive device, slower speed, mild gait deviations, deviates 6-10 in outside 12 in walkway width. Ambulates 20 ft in less than 9 sec but greater than 7 sec.   ? Ambulating Backwards Walks 20 ft, no assistive devices, good speed, no evidence for imbalance, normal gait   ? Steps Alternating feet, must use rail.   ? Total Score 20   ? FGA comment: 20/30   ? ?  ?  ? ?  ? ? ? ? ? ? ? ? ? ? ? ? ? ? ? ? ? ? ? ? ? Balance Exercises - 12/31/21 0001   ? ?  ? Balance Exercises: Standing  ? Standing Eyes Closed Narrow base of support (BOS);Wide (BOA);Head turns;Foam/compliant surface;5 reps   horizontal and vertical head turns - standing on Airex and on foam balance beam with EO and EC 10 sec hold each without head turns, then added head turns with EO and EC 5 reps each  ? Rockerboard Anterior/posterior;Head turns;EO;EC;10 reps   pt stood on rockerboard inside // bars -held board steady with EO for 10 secs, then held steady with EC for 10 secs; added head turns EO side to side 5 reps, up/down 5 reps and then performed with EC side to side 5 reps and up/down 5 reps with UE support  ? Balance Beam standing perpendicular on beam with LUE support prn - EO; pt performed sidestepping on beam inside // bars with UE support prn; stepping over/back of blue balance beam 5 reps each leg  with UE support prn   ? Other  Standing Exercises Pt performed marching on Airex - inside // bars with UE support prn; EO and then EC ; performed horizontal head turns with EO 5 reps and vertical 5 reps with EO; EC - marching only 10 reps each without head turns with UE support prn for balance recovery   ? Other Standing Exercises Comments Pt performed  cone taps standing on floor to 2 cones 5 reps straight ahead; diagonals 3 reps each foot; then progressed to tipping cone over and standing upright 2 reps each foot with min assist needed for recovery of LOB due to SLS deficits(standing on floor)   ? ?  ?  ? ?  ? ? ? ? ? ? ? PT Short Term Goals - 11/13/21 1130   ? ?  ? PT SHORT TERM GOAL #1  ? Title = LTG   ? ?  ?  ? ?  ? ? ? ? PT Long Term Goals - 12/31/21 0807   ? ?  ? PT LONG TERM GOAL #1  ? Title Pt will demonstrate independence with updated balance HEP  (LTG set at 4 weeks but POC is 8 weeks, can extend date out another 4 weeks if needed to 01/12/22)   ? Baseline met 12-31-21   ? Time 4   ? Period Weeks   ? Status Achieved   ? Target Date 12/13/21   ?  ? PT LONG TERM GOAL #2  ? Title Pt will report no dizziness during any of his work tasks   ? Baseline Reports improvement with dizziness when stepping up on to step stool   ? Status Achieved   ?  ? PT LONG TERM GOAL #3  ? Title Pt will be able to maintain his balance for 30 sec in condition 4 of mCTSIB to demo improved vestibular integration   ? Baseline 20 sec;       30 secs on 12-31-21   ? Time 4   ? Period Weeks   ? Status Achieved   ? Target Date 12/13/21   ?  ? PT LONG TERM GOAL #4  ? Title Pt will have improved FGA score to at least 22/30 to demo decreased fall risk   ? Baseline 13/30 > 16/30;              20/30 on 12-31-21   ? Time 4   ? Period Weeks   ? Status Partially Met   ? Target Date 12/13/21   ?  ? PT LONG TERM GOAL #5  ? Title Pt will have increased FOTO score to at least 57   ? Baseline 39 > 53;     53.29 on 12-31-21   ? Time 4   ? Period Weeks   ? Status Not Met   ? Target Date  12/13/21   ? ?  ?  ? ?  ? ? ? ? ? ? ? ? Plan - 12/31/21 1936   ? ? Clinical Impression Statement Pt has met LTG's #1-3; pt has partially met LTG #4 as FGA score has improved from 16/30 to 20/30 but not to stated go

## 2022-01-19 DIAGNOSIS — H401133 Primary open-angle glaucoma, bilateral, severe stage: Secondary | ICD-10-CM | POA: Diagnosis not present

## 2022-01-19 DIAGNOSIS — Z961 Presence of intraocular lens: Secondary | ICD-10-CM | POA: Diagnosis not present

## 2022-01-20 ENCOUNTER — Other Ambulatory Visit: Payer: Self-pay

## 2022-01-20 DIAGNOSIS — R768 Other specified abnormal immunological findings in serum: Secondary | ICD-10-CM

## 2022-01-20 DIAGNOSIS — D472 Monoclonal gammopathy: Secondary | ICD-10-CM

## 2022-01-21 ENCOUNTER — Inpatient Hospital Stay: Payer: Medicare Other | Attending: Hematology

## 2022-01-21 ENCOUNTER — Other Ambulatory Visit: Payer: Self-pay

## 2022-01-21 DIAGNOSIS — I10 Essential (primary) hypertension: Secondary | ICD-10-CM | POA: Insufficient documentation

## 2022-01-21 DIAGNOSIS — R768 Other specified abnormal immunological findings in serum: Secondary | ICD-10-CM

## 2022-01-21 DIAGNOSIS — Z7901 Long term (current) use of anticoagulants: Secondary | ICD-10-CM | POA: Insufficient documentation

## 2022-01-21 DIAGNOSIS — I700 Atherosclerosis of aorta: Secondary | ICD-10-CM | POA: Insufficient documentation

## 2022-01-21 DIAGNOSIS — D472 Monoclonal gammopathy: Secondary | ICD-10-CM | POA: Diagnosis not present

## 2022-01-21 LAB — CMP (CANCER CENTER ONLY)
ALT: 31 U/L (ref 0–44)
AST: 30 U/L (ref 15–41)
Albumin: 4.1 g/dL (ref 3.5–5.0)
Alkaline Phosphatase: 63 U/L (ref 38–126)
Anion gap: 5 (ref 5–15)
BUN: 27 mg/dL — ABNORMAL HIGH (ref 8–23)
CO2: 33 mmol/L — ABNORMAL HIGH (ref 22–32)
Calcium: 9.3 mg/dL (ref 8.9–10.3)
Chloride: 102 mmol/L (ref 98–111)
Creatinine: 1.05 mg/dL (ref 0.61–1.24)
GFR, Estimated: >60 mL/min (ref >60)
Glucose, Bld: 182 mg/dL — ABNORMAL HIGH (ref 70–99)
Potassium: 3.9 mmol/L (ref 3.5–5.1)
Sodium: 140 mmol/L (ref 135–145)
Total Bilirubin: 1.1 mg/dL (ref 0.3–1.2)
Total Protein: 7 g/dL (ref 6.5–8.1)

## 2022-01-21 LAB — CBC WITH DIFFERENTIAL (CANCER CENTER ONLY)
Abs Immature Granulocytes: 0.02 10*3/uL (ref 0.00–0.07)
Basophils Absolute: 0 10*3/uL (ref 0.0–0.1)
Basophils Relative: 0 %
Eosinophils Absolute: 0.2 10*3/uL (ref 0.0–0.5)
Eosinophils Relative: 3 %
HCT: 43.7 % (ref 39.0–52.0)
Hemoglobin: 14.7 g/dL (ref 13.0–17.0)
Immature Granulocytes: 0 %
Lymphocytes Relative: 20 %
Lymphs Abs: 1.2 10*3/uL (ref 0.7–4.0)
MCH: 31.3 pg (ref 26.0–34.0)
MCHC: 33.6 g/dL (ref 30.0–36.0)
MCV: 93.2 fL (ref 80.0–100.0)
Monocytes Absolute: 0.8 10*3/uL (ref 0.1–1.0)
Monocytes Relative: 14 %
Neutro Abs: 3.8 10*3/uL (ref 1.7–7.7)
Neutrophils Relative %: 63 %
Platelet Count: 156 10*3/uL (ref 150–400)
RBC: 4.69 MIL/uL (ref 4.22–5.81)
RDW: 13.8 % (ref 11.5–15.5)
WBC Count: 6 10*3/uL (ref 4.0–10.5)
nRBC: 0 % (ref 0.0–0.2)

## 2022-01-22 LAB — KAPPA/LAMBDA LIGHT CHAINS
Kappa free light chain: 226.3 mg/L — ABNORMAL HIGH (ref 3.3–19.4)
Kappa, lambda light chain ratio: 14.79 — ABNORMAL HIGH (ref 0.26–1.65)
Lambda free light chains: 15.3 mg/L (ref 5.7–26.3)

## 2022-01-25 LAB — MULTIPLE MYELOMA PANEL, SERUM
Albumin SerPl Elph-Mcnc: 3.7 g/dL (ref 2.9–4.4)
Albumin/Glob SerPl: 1.4 (ref 0.7–1.7)
Alpha 1: 0.2 g/dL (ref 0.0–0.4)
Alpha2 Glob SerPl Elph-Mcnc: 0.7 g/dL (ref 0.4–1.0)
B-Globulin SerPl Elph-Mcnc: 0.8 g/dL (ref 0.7–1.3)
Gamma Glob SerPl Elph-Mcnc: 0.9 g/dL (ref 0.4–1.8)
Globulin, Total: 2.7 g/dL (ref 2.2–3.9)
IgA: 125 mg/dL (ref 61–437)
IgG (Immunoglobin G), Serum: 745 mg/dL (ref 603–1613)
IgM (Immunoglobulin M), Srm: 228 mg/dL — ABNORMAL HIGH (ref 15–143)
Total Protein ELP: 6.4 g/dL (ref 6.0–8.5)

## 2022-01-28 ENCOUNTER — Ambulatory Visit: Payer: Medicare Other | Admitting: Hematology

## 2022-01-28 ENCOUNTER — Inpatient Hospital Stay: Payer: Medicare Other | Admitting: Hematology

## 2022-01-28 ENCOUNTER — Other Ambulatory Visit: Payer: Self-pay

## 2022-01-28 VITALS — BP 132/91 | HR 54 | Temp 97.6°F | Resp 18 | Wt 265.6 lb

## 2022-01-28 DIAGNOSIS — Z7901 Long term (current) use of anticoagulants: Secondary | ICD-10-CM | POA: Diagnosis not present

## 2022-01-28 DIAGNOSIS — D472 Monoclonal gammopathy: Secondary | ICD-10-CM | POA: Diagnosis not present

## 2022-01-28 DIAGNOSIS — R768 Other specified abnormal immunological findings in serum: Secondary | ICD-10-CM

## 2022-01-28 DIAGNOSIS — I700 Atherosclerosis of aorta: Secondary | ICD-10-CM | POA: Diagnosis not present

## 2022-01-28 DIAGNOSIS — I10 Essential (primary) hypertension: Secondary | ICD-10-CM | POA: Diagnosis not present

## 2022-02-03 NOTE — Progress Notes (Signed)
HEMATOLOGY/ONCOLOGY CLINIC NOTE  Date of Service: 01/28/2022   Patient Care Team: Lujean Amel, MD as PCP - General (Family Medicine) Minus Breeding, MD as PCP - Cardiology (Cardiology)  CHIEF COMPLAINTS/PURPOSE OF CONSULTATION:  Follow-up for light chain MGUS  HISTORY OF PRESENTING ILLNESS:   Glen Bolton is a wonderful 81 y.o. male who has been referred to [place] by my colleague Dr Grace Isaac for evaluation and management of Elevated serum kappa light chains. He is accompanied today by his wife. The pt reports that he is doing well overall.   The pt reports that he had no symptoms before his abnormal lab results prompting his initial visit with Dr Lebron Conners. He denies any concerns for inflammation besides an ear infection for the last 3 weeks and a viral infection as well. He is being followed by his PCP and has had a 20dB drop in his right ear threshold. He has had upper respiratory symptoms including a bad cough that hurt his abdomen which is coupled by a bruise at his lower abdomen. He denies any trauma to the area but does take Xarelto for his Afib. He has been treated with a z-pack and other antibiotics. He has had tubes put in his ear 3 times in the past but denies any ear pain.   He notes that he has arthritis in his knees but denies concern for rheumatoid arthritis.   Of note prior to the patient's visit today, pt has had PET/CT completed on 02/06/18 with results revealing 1. No hypermetabolic mass or adenopathy identified. 2. No evidence for hepatomegaly or splenomegaly. 3. Mild low level FDG uptake throughout the bone marrow is noted. Nonspecific and may be physiologic. 4.  Aortic Atherosclerosis (ICD10-I70.0). 5. Bilateral maxillary sinus disease.   Surgical pathology 02/03/18 revealed HYPERCELLULAR BONE MARROW FOR AGE WITH TRILINEAGE HEMATOPOIESIS. - A MINOR PLASMA CELL COMPONENT WITH KAPPA LIGHT CHAIN EXCESS. - A FEW SMALL LYMPHOID AGGREGATES PRESENT.   Most recent lab  results (02/03/18) of CBC w/ diff  is as follows: all values are WNL. UPEP Light chains 01/26/18 showed Total Protein ur/day at 156, Free kappa lt chains, ur at 93.20, Free lambda lt chains at 7.61, Free Kappa/Lambda ratio at 12.25.  MMP 01/24/18 showed all values WNL except for IgM at 244. M Protein not observed.   On review of systems, pt reports cough, ear infection, lower abdomen superficial bruising, and denies fevers, chills, night sweats, leg swelling, testicular pain or swelling, and any other symptoms.   Interval History:   Glen Bolton is here for scheduled 1 year follow-up of his light chain MGUS.  He notes no acute new symptoms since his last clinic visit.  No new focal bone pains.  No significant new fatigue.   No fevers no chills no night sweats no unexpected weight loss.  No new chest pain or shortness of breath. Chronic issues with arthritis. Labs done on 01/21/2022 were reviewed with him in detail.   MEDICAL HISTORY:  Past Medical History:  Diagnosis Date   Anxiety    Arthritis    Atrial fibrillation, chronic 08/26/2015   Borderline diabetic    Chronic anticoagulation 12/04/2018   CHADS VASC-3-Xarelto   GERD (gastroesophageal reflux disease)    Glaucoma    Hard of hearing    History of gastritis    History of kidney stones    History of stomach ulcers    Hx of migraines    no migraines since he was in his 62's  Hypertension    Osteoarthritis of right knee 12/13/2019   Peripheral neuropathy 12/26/2017   Primary osteoarthritis of left hip 10/25/2015   Sleep apnea    CPAP  not currently working  couldnt breathe with it laying down trying something different end of  April or May 2021    SURGICAL HISTORY: Past Surgical History:  Procedure Laterality Date   CATARACT EXTRACTION Bilateral 2014   COLONOSCOPY     ESOPHAGOGASTRODUODENOSCOPY     EYE SURGERY     INGUINAL HERNIA REPAIR Left    x 2   Rotator cuff surgery Right 2013   TOTAL HIP ARTHROPLASTY Left 10/27/2015    Procedure: TOTAL HIP ARTHROPLASTY ANTERIOR APPROACH;  Surgeon: Frederik Pear, MD;  Location: [facility];  Service: Orthopedics;  Laterality: Left;   TOTAL KNEE ARTHROPLASTY Right 12/17/2019   Procedure: RIGHT TOTAL KNEE ARTHROPLASTY;  Surgeon: Frederik Pear, MD;  Location: [facility];  Service: Orthopedics;  Laterality: Right;    SOCIAL HISTORY: Social History   Socioeconomic History   Marital status: Married    Spouse name: Not on file   Number of children: 2   Years of education: Not on file   Highest education level: Not on file  Occupational History   Occupation: [employer]  Tobacco Use   Smoking status: Never   Smokeless tobacco: Never  Vaping Use   Vaping Use: Never used  Substance and Sexual Activity   Alcohol use: Yes    Alcohol/week: 1.0 standard drink    Types: 1 Glasses of wine per week    Comment: daily   Drug use: No   Sexual activity: Not Currently  Other Topics Concern   Not on file  Social History Narrative   Lives with wife   Caffeine use: 2 cups coffee daily   [employer]   Right handed    Social Determinants of Health   Financial Resource Strain: Not on file  Food Insecurity: Not on file  Transportation Needs: Not on file  Physical Activity: Not on file  Stress: Not on file  Social Connections: Not on file  Intimate Partner Violence: Not on file    FAMILY HISTORY: Family History  Problem Relation Age of Onset   Hyperlipidemia Brother    Stroke Sister 61    ALLERGIES:  has No Known Allergies.  MEDICATIONS:  Current Outpatient Medications  Medication Sig Dispense Refill   albuterol (VENTOLIN HFA) 108 (90 Base) MCG/ACT inhaler 2 puffs as needed     albuterol (VENTOLIN HFA) 108 (90 Base) MCG/ACT inhaler SMARTSIG:2 Puff(s) By Mouth Every 4 Hours PRN     chlorthalidone (HYGROTON) 25 MG tablet Take 25 mg by mouth every morning.     dorzolamide (TRUSOPT) 2 % ophthalmic solution 1 drop into affected eye     dorzolamide-timolol (COSOPT) 22.3-6.8 MG/ML  ophthalmic solution Place 1 drop into both eyes 2 (two) times daily.   8   hydrochlorothiazide (HYDRODIURIL) 12.5 MG tablet Take 12.5 mg by mouth every evening.      methylPREDNISolone (MEDROL DOSEPAK) 4 MG TBPK tablet 6 day dose pack - take as directed 21 tablet 0   metoprolol succinate (TOPROL-XL) 50 MG 24 hr tablet TAKE 1 TABLET(50 MG) BY MOUTH DAILY 90 tablet 3   Multiple Vitamin (MULTIVITAMIN WITH MINERALS) TABS tablet Take 1 tablet by mouth daily.     olmesartan (BENICAR) 40 MG tablet Take 40 mg by mouth daily.   4   Probiotic Product (PROBIOTIC DAILY PO) Take 1 capsule by  mouth every evening.     rivaroxaban (XARELTO) 20 MG TABS tablet TAKE 1 TABLET(20 MG) BY MOUTH DAILY WITH SUPPER 30 tablet 5   triamcinolone (NASACORT) 55 MCG/ACT AERO nasal inhaler Place 2 sprays into the nose daily. 2 sprays each nostril at night on a regular basis 1 each 12   No current facility-administered medications for this visit.    REVIEW OF SYSTEMS:   10 Point review of Systems was done is negative except as noted above.  PHYSICAL EXAMINATION: .BP (!) 132/91   Pulse (!) 54   Temp 97.6 F (36.4 C)   Resp 18   Wt 265 lb 9.6 oz (120.5 kg)   SpO2 97%   BMI 38.11 kg/m   NAD GENERAL:alert, in no acute distress and comfortable SKIN: no acute rashes, no significant lesions EYES: conjunctiva are pink and non-injected, sclera anicteric OROPHARYNX: MMM, no exudates, no oropharyngeal erythema or ulceration NECK: supple, no JVD LYMPH:  no palpable lymphadenopathy in the cervical, axillary or inguinal regions LUNGS: clear to auscultation b/l with normal respiratory effort HEART: regular rate & rhythm ABDOMEN:  normoactive bowel sounds , non tender, not distended. Extremity: no pedal edema PSYCH: alert & oriented x 3 with fluent speech NEURO: no focal motor/sensory deficits  LABORATORY DATA:  I have reviewed the data as listed  .    Latest Ref Rng & Units 01/21/2022    9:39 AM 12/12/2020   11:47 AM  12/18/2019    3:29 AM  CBC  WBC 4.0 - 10.5 K/uL 6.0   6.8   9.5    Hemoglobin 13.0 - 17.0 g/dL 14.7   15.7   14.3    Hematocrit 39.0 - 52.0 % 43.7   46.2   43.6    Platelets 150 - 400 K/uL 156   154   154      .    Latest Ref Rng & Units 01/21/2022    9:39 AM 12/12/2020   11:47 AM 12/18/2019    3:29 AM  CMP  Glucose 70 - 99 mg/dL 182   105   265    BUN 8 - 23 mg/dL '27   21   18    '$ Creatinine 0.61 - 1.24 mg/dL 1.05   0.94   0.86    Sodium 135 - 145 mmol/L 140   141   136    Potassium 3.5 - 5.1 mmol/L 3.9   4.1   4.3    Chloride 98 - 111 mmol/L 102   106   102    CO2 22 - 32 mmol/L 33   23   24    Calcium 8.9 - 10.3 mg/dL 9.3   8.8   8.6    Total Protein 6.5 - 8.1 g/dL 7.0   6.9     Total Bilirubin 0.3 - 1.2 mg/dL 1.1   0.8     Alkaline Phos 38 - 126 U/L 63   68     AST 15 - 41 U/L 30   29     ALT 0 - 44 U/L 31   31      02/03/18 BM Report:    02/03/18 Flow Cytometry:   02/03/18 Cytogenetics:    RADIOGRAPHIC STUDIES: I have personally reviewed the radiological images as listed and agreed with the findings in the report. No results found.  ASSESSMENT & PLAN:   81 y.o. male with  1. Light chain MGUS  PET/CT from 02/06/18 did not reveal any bone  lesions and revealed Mild low level FDG uptake throughout the bone marrow is noted.  Labs upon initial presentation from 02/03/18 and 01/24/18, Free kappa lt chains in the urine were elevated at 93.20. No anemia, no abnormal kidney functions. Nonspecific and may be physiologic.  IgM increase noted without M-spike 02/03/18 BM bx pathology indicated 2% plasma cells 02/03/18 Cytogenetics showed normal genetics without specific mutation Slow moving lymphoma or plasma cell disorder vs reactive to chronic inflammation with frequent sinus and ear infections ?  PLAN: -Patient notes no acute new symptoms suggestive of progression to symptomatic plasma cell dyscrasia. -Labs from 01/21/2022 were discussed in detail CBC within normal limits with  no anemia CMP showed stable creatinine of 1.05 and a normal calcium level of 9.3 Myeloma panel shows no observable M spike Serum kappa free light chains remain elevated at 226 with a kappa lambda ratio of 14.79 which is not significantly changed. Patient continues to prefer a conservative approach to continue to monitor this. He will continue to follow with his PCP for management of his other medical comorbidities. He will let us know if there are any other acute new symptoms associated with plasma cell dyscrasias as were discussed with him.  FOLLOW UP: RTC with Dr Irene Limbo in 12 months Plz schedule labs 1 week prior to clinic visit  The total time spent in the appointment was 20 minutes*.  All of the patient's questions were answered with apparent satisfaction. The patient knows to call the clinic with any problems, questions or concerns.   Sullivan Lone MD MS AAHIVMS [facility] Hematology/Oncology Physician [facility]  .*Total Encounter Time as defined by the Centers for Medicare and Medicaid Services includes, in addition to the face-to-face time of a patient visit (documented in the note above) non-face-to-face time: obtaining and reviewing outside history, ordering and reviewing medications, tests or procedures, care coordination (communications with other health care professionals or caregivers) and documentation in the medical record.

## 2022-02-15 DIAGNOSIS — I10 Essential (primary) hypertension: Secondary | ICD-10-CM | POA: Diagnosis not present

## 2022-02-15 DIAGNOSIS — E1169 Type 2 diabetes mellitus with other specified complication: Secondary | ICD-10-CM | POA: Diagnosis not present

## 2022-02-15 DIAGNOSIS — I4891 Unspecified atrial fibrillation: Secondary | ICD-10-CM | POA: Diagnosis not present

## 2022-02-15 DIAGNOSIS — E7800 Pure hypercholesterolemia, unspecified: Secondary | ICD-10-CM | POA: Diagnosis not present

## 2022-02-26 DIAGNOSIS — R030 Elevated blood-pressure reading, without diagnosis of hypertension: Secondary | ICD-10-CM | POA: Diagnosis not present

## 2022-02-26 DIAGNOSIS — M79605 Pain in left leg: Secondary | ICD-10-CM | POA: Diagnosis not present

## 2022-04-21 DIAGNOSIS — Z961 Presence of intraocular lens: Secondary | ICD-10-CM | POA: Diagnosis not present

## 2022-04-21 DIAGNOSIS — Z0001 Encounter for general adult medical examination with abnormal findings: Secondary | ICD-10-CM | POA: Diagnosis not present

## 2022-04-21 DIAGNOSIS — D6869 Other thrombophilia: Secondary | ICD-10-CM | POA: Diagnosis not present

## 2022-04-21 DIAGNOSIS — I4891 Unspecified atrial fibrillation: Secondary | ICD-10-CM | POA: Diagnosis not present

## 2022-04-21 DIAGNOSIS — I10 Essential (primary) hypertension: Secondary | ICD-10-CM | POA: Diagnosis not present

## 2022-04-21 DIAGNOSIS — H401133 Primary open-angle glaucoma, bilateral, severe stage: Secondary | ICD-10-CM | POA: Diagnosis not present

## 2022-04-21 DIAGNOSIS — E1169 Type 2 diabetes mellitus with other specified complication: Secondary | ICD-10-CM | POA: Diagnosis not present

## 2022-04-21 DIAGNOSIS — E7800 Pure hypercholesterolemia, unspecified: Secondary | ICD-10-CM | POA: Diagnosis not present

## 2022-04-21 DIAGNOSIS — D472 Monoclonal gammopathy: Secondary | ICD-10-CM | POA: Diagnosis not present

## 2022-04-21 DIAGNOSIS — Z79899 Other long term (current) drug therapy: Secondary | ICD-10-CM | POA: Diagnosis not present

## 2022-04-27 DIAGNOSIS — H401113 Primary open-angle glaucoma, right eye, severe stage: Secondary | ICD-10-CM | POA: Diagnosis not present

## 2022-06-06 ENCOUNTER — Other Ambulatory Visit: Payer: Self-pay | Admitting: Cardiology

## 2022-06-11 DIAGNOSIS — Z23 Encounter for immunization: Secondary | ICD-10-CM | POA: Diagnosis not present

## 2022-06-28 ENCOUNTER — Other Ambulatory Visit: Payer: Self-pay | Admitting: Cardiology

## 2022-06-28 DIAGNOSIS — Z1211 Encounter for screening for malignant neoplasm of colon: Secondary | ICD-10-CM | POA: Diagnosis not present

## 2022-06-28 NOTE — Telephone Encounter (Signed)
Prescription refill request for Xarelto received.  Indication:Afib Last office visit:3/23 Weight:120.5 kg Age:81 Scr:1.0 CrCl:102.09 ml/min  Prescription refilled

## 2022-07-01 ENCOUNTER — Other Ambulatory Visit: Payer: Self-pay | Admitting: Cardiology

## 2022-07-01 NOTE — Telephone Encounter (Signed)
Prescription refill request for Xarelto received.  Indication: afib  Last office visit: Hochrein, 11/06/2021 Weight: 120.5 kg  Age: 81 yo Scr: 1.05, 01/21/2022 CrCl: 97 ml/min   Refill sent.

## 2022-07-27 DIAGNOSIS — H401133 Primary open-angle glaucoma, bilateral, severe stage: Secondary | ICD-10-CM | POA: Diagnosis not present

## 2022-08-13 DIAGNOSIS — Z03818 Encounter for observation for suspected exposure to other biological agents ruled out: Secondary | ICD-10-CM | POA: Diagnosis not present

## 2022-08-13 DIAGNOSIS — L989 Disorder of the skin and subcutaneous tissue, unspecified: Secondary | ICD-10-CM | POA: Diagnosis not present

## 2022-08-13 DIAGNOSIS — J019 Acute sinusitis, unspecified: Secondary | ICD-10-CM | POA: Diagnosis not present

## 2022-08-13 DIAGNOSIS — R059 Cough, unspecified: Secondary | ICD-10-CM | POA: Diagnosis not present

## 2022-08-17 DIAGNOSIS — E1169 Type 2 diabetes mellitus with other specified complication: Secondary | ICD-10-CM | POA: Diagnosis not present

## 2022-08-17 DIAGNOSIS — E7800 Pure hypercholesterolemia, unspecified: Secondary | ICD-10-CM | POA: Diagnosis not present

## 2022-09-10 ENCOUNTER — Ambulatory Visit
Admission: RE | Admit: 2022-09-10 | Discharge: 2022-09-10 | Disposition: A | Discharge status: Home / Self Care | Payer: Medicare Other | Source: Ambulatory Visit | Attending: Family Medicine | Admitting: Family Medicine

## 2022-09-10 ENCOUNTER — Other Ambulatory Visit: Payer: Self-pay | Admitting: Family Medicine

## 2022-09-10 DIAGNOSIS — R059 Cough, unspecified: Secondary | ICD-10-CM

## 2022-09-24 DIAGNOSIS — Z9889 Other specified postprocedural states: Secondary | ICD-10-CM | POA: Diagnosis not present

## 2022-09-24 DIAGNOSIS — H401133 Primary open-angle glaucoma, bilateral, severe stage: Secondary | ICD-10-CM | POA: Diagnosis not present

## 2022-10-06 DIAGNOSIS — B079 Viral wart, unspecified: Secondary | ICD-10-CM | POA: Diagnosis not present

## 2022-10-06 DIAGNOSIS — D485 Neoplasm of uncertain behavior of skin: Secondary | ICD-10-CM | POA: Diagnosis not present

## 2022-11-15 DIAGNOSIS — E1142 Type 2 diabetes mellitus with diabetic polyneuropathy: Secondary | ICD-10-CM | POA: Diagnosis not present

## 2022-11-15 DIAGNOSIS — D6869 Other thrombophilia: Secondary | ICD-10-CM | POA: Diagnosis not present

## 2022-11-15 DIAGNOSIS — Z79899 Other long term (current) drug therapy: Secondary | ICD-10-CM | POA: Diagnosis not present

## 2022-11-15 DIAGNOSIS — I4891 Unspecified atrial fibrillation: Secondary | ICD-10-CM | POA: Diagnosis not present

## 2022-11-15 DIAGNOSIS — E1169 Type 2 diabetes mellitus with other specified complication: Secondary | ICD-10-CM | POA: Diagnosis not present

## 2022-11-15 DIAGNOSIS — M722 Plantar fascial fibromatosis: Secondary | ICD-10-CM | POA: Diagnosis not present

## 2022-11-17 DIAGNOSIS — C44319 Basal cell carcinoma of skin of other parts of face: Secondary | ICD-10-CM | POA: Diagnosis not present

## 2022-11-17 DIAGNOSIS — L814 Other melanin hyperpigmentation: Secondary | ICD-10-CM | POA: Diagnosis not present

## 2022-11-17 DIAGNOSIS — L570 Actinic keratosis: Secondary | ICD-10-CM | POA: Diagnosis not present

## 2022-11-17 DIAGNOSIS — L821 Other seborrheic keratosis: Secondary | ICD-10-CM | POA: Diagnosis not present

## 2022-11-17 DIAGNOSIS — D485 Neoplasm of uncertain behavior of skin: Secondary | ICD-10-CM | POA: Diagnosis not present

## 2022-11-17 DIAGNOSIS — D1801 Hemangioma of skin and subcutaneous tissue: Secondary | ICD-10-CM | POA: Diagnosis not present

## 2022-11-24 DIAGNOSIS — C44319 Basal cell carcinoma of skin of other parts of face: Secondary | ICD-10-CM | POA: Diagnosis not present

## 2022-11-28 ENCOUNTER — Other Ambulatory Visit: Payer: Self-pay | Admitting: Cardiology

## 2022-11-28 DIAGNOSIS — I482 Chronic atrial fibrillation, unspecified: Secondary | ICD-10-CM

## 2022-11-29 ENCOUNTER — Telehealth: Payer: Self-pay | Admitting: Cardiology

## 2022-11-29 NOTE — Telephone Encounter (Signed)
*  STAT* If patient is at the pharmacy, call can be transferred to refill team.   1. Which medications need to be refilled? (please list name of each medication and dose if known) rivaroxaban (XARELTO) 20 MG TABS tablet   2. Which pharmacy/location (including street and city if local pharmacy) is medication to be sent to?  [place], [place] - [address] AT [place]    3. Do they need a 30 day or 90 day supply? 90   Patient has appt on 4/24

## 2022-11-29 NOTE — Telephone Encounter (Signed)
Prescription refill request for Xarelto received.  Indication: Afib  Last office visit: 11/06/21 (Hochrein)  Weight: 120.5kg Age: 82 Scr: 1.05 (01/21/22)  CrCl: 95.29ml/min  Pt overdue to see provider. Called, no answer. Left message on voicemail.

## 2022-11-29 NOTE — Telephone Encounter (Signed)
Pt scheduled appt with Mayra Reel, NP on 12/29/22. Refill sent.

## 2022-11-30 NOTE — Telephone Encounter (Signed)
Refill sent to requested pharmacy on 11/29/22.

## 2022-12-02 DIAGNOSIS — M7661 Achilles tendinitis, right leg: Secondary | ICD-10-CM | POA: Diagnosis not present

## 2022-12-02 DIAGNOSIS — M2012 Hallux valgus (acquired), left foot: Secondary | ICD-10-CM | POA: Diagnosis not present

## 2022-12-02 DIAGNOSIS — M722 Plantar fascial fibromatosis: Secondary | ICD-10-CM | POA: Diagnosis not present

## 2022-12-02 DIAGNOSIS — M7731 Calcaneal spur, right foot: Secondary | ICD-10-CM | POA: Diagnosis not present

## 2022-12-02 DIAGNOSIS — M7732 Calcaneal spur, left foot: Secondary | ICD-10-CM | POA: Diagnosis not present

## 2022-12-02 DIAGNOSIS — M792 Neuralgia and neuritis, unspecified: Secondary | ICD-10-CM | POA: Diagnosis not present

## 2022-12-02 DIAGNOSIS — M2011 Hallux valgus (acquired), right foot: Secondary | ICD-10-CM | POA: Diagnosis not present
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
## 2022-12-29 ENCOUNTER — Encounter: Payer: Self-pay | Admitting: Student

## 2022-12-29 ENCOUNTER — Ambulatory Visit: Payer: Medicare Other | Attending: Student | Admitting: Student

## 2022-12-29 VITALS — BP 132/70 | HR 61 | Ht 69.0 in | Wt 267.4 lb

## 2022-12-29 DIAGNOSIS — I4821 Permanent atrial fibrillation: Secondary | ICD-10-CM

## 2022-12-29 DIAGNOSIS — I272 Pulmonary hypertension, unspecified: Secondary | ICD-10-CM

## 2022-12-29 DIAGNOSIS — G4733 Obstructive sleep apnea (adult) (pediatric): Secondary | ICD-10-CM

## 2022-12-29 DIAGNOSIS — I10 Essential (primary) hypertension: Secondary | ICD-10-CM

## 2022-12-29 NOTE — Patient Instructions (Signed)
Medication Instructions:  Your physician recommends that you continue on your current medications as directed. Please refer to the Current Medication list given to you today.  *If you need a refill on your cardiac medications before your next appointment, please call your pharmacy*   Lab Work: NONE If you have labs (blood work) drawn today and your tests are completely normal, you will receive your results only by: MyChart Message (if you have MyChart) OR A paper copy in the mail If you have any lab test that is abnormal or we need to change your treatment, we will call you to review the results.   Testing/Procedures: NONE   Follow-Up: At [facility], you and your health needs are our priority.  As part of our continuing mission to provide you with exceptional heart care, we have created designated Provider Care Teams.  These Care Teams include your primary Cardiologist (physician) and Advanced Practice Providers (APPs -  Physician Assistants and Nurse Practitioners) who all work together to provide you with the care you need, when you need it.  We recommend signing up for the patient portal called "MyChart".  Sign up information is provided on this After Visit Summary.  MyChart is used to connect with patients for Virtual Visits (Telemedicine).  Patients are able to view lab/test results, encounter notes, upcoming appointments, etc.  Non-urgent messages can be sent to your provider as well.   To learn more about what you can do with MyChart, go to [email or web address].    Your next appointment:   1 year(s)  Provider:   James Hochrein, MD   

## 2023-01-05 ENCOUNTER — Other Ambulatory Visit: Payer: Self-pay | Admitting: Cardiology

## 2023-01-05 DIAGNOSIS — I482 Chronic atrial fibrillation, unspecified: Secondary | ICD-10-CM

## 2023-01-05 MED ORDER — RIVAROXABAN 20 MG PO TABS: ORAL_TABLET | ORAL | 1 refills | Status: DC

## 2023-01-05 NOTE — Telephone Encounter (Signed)
Xarelto 20mg  refill request received. Pt is 82 years old, weight-121.3kg, Crea-1.01 on 04/22/22 via [facility] from [facility], last seen by Carlos Levering on 12/29/22, Diagnosis-Afib, CrCl-96.27 mL/min; Dose is appropriate based on dosing criteria. Will send in refill to requested pharmacy.

## 2023-01-05 NOTE — Telephone Encounter (Signed)
*  STAT* If patient is at the pharmacy, call can be transferred to refill team.   1. Which medications need to be refilled? (please list name of each medication and dose if known) rivaroxaban (XARELTO) 20 MG TABS tablet     2. Which pharmacy/location (including street and city if local pharmacy) is medication to be sent to?  [id number] - [place], [place] - [address]    3. Do they need a 30 day or 90 day supply? 90

## 2023-01-25 ENCOUNTER — Telehealth: Payer: Self-pay | Admitting: Hematology

## 2023-01-25 ENCOUNTER — Other Ambulatory Visit: Payer: Self-pay

## 2023-01-25 DIAGNOSIS — D472 Monoclonal gammopathy: Secondary | ICD-10-CM

## 2023-01-26 ENCOUNTER — Inpatient Hospital Stay: Payer: Medicare Other

## 2023-01-28 DIAGNOSIS — H401133 Primary open-angle glaucoma, bilateral, severe stage: Secondary | ICD-10-CM | POA: Diagnosis not present

## 2023-02-02 ENCOUNTER — Inpatient Hospital Stay: Payer: Medicare Other | Admitting: Hematology

## 2023-02-15 ENCOUNTER — Other Ambulatory Visit: Payer: Self-pay

## 2023-02-15 ENCOUNTER — Inpatient Hospital Stay: Payer: Medicare Other | Attending: Hematology

## 2023-02-15 DIAGNOSIS — I4891 Unspecified atrial fibrillation: Secondary | ICD-10-CM | POA: Diagnosis not present

## 2023-02-15 DIAGNOSIS — I700 Atherosclerosis of aorta: Secondary | ICD-10-CM | POA: Diagnosis not present

## 2023-02-15 DIAGNOSIS — M1990 Unspecified osteoarthritis, unspecified site: Secondary | ICD-10-CM | POA: Diagnosis not present

## 2023-02-15 DIAGNOSIS — Z87442 Personal history of urinary calculi: Secondary | ICD-10-CM | POA: Diagnosis not present

## 2023-02-15 DIAGNOSIS — J320 Chronic maxillary sinusitis: Secondary | ICD-10-CM | POA: Diagnosis not present

## 2023-02-15 DIAGNOSIS — D472 Monoclonal gammopathy: Secondary | ICD-10-CM | POA: Diagnosis not present

## 2023-02-15 DIAGNOSIS — Z7901 Long term (current) use of anticoagulants: Secondary | ICD-10-CM | POA: Insufficient documentation

## 2023-02-15 DIAGNOSIS — M129 Arthropathy, unspecified: Secondary | ICD-10-CM | POA: Insufficient documentation

## 2023-02-15 DIAGNOSIS — K219 Gastro-esophageal reflux disease without esophagitis: Secondary | ICD-10-CM | POA: Diagnosis not present

## 2023-02-15 DIAGNOSIS — Z86711 Personal history of pulmonary embolism: Secondary | ICD-10-CM | POA: Diagnosis not present

## 2023-02-15 DIAGNOSIS — Z79899 Other long term (current) drug therapy: Secondary | ICD-10-CM | POA: Diagnosis not present

## 2023-02-15 DIAGNOSIS — G4730 Sleep apnea, unspecified: Secondary | ICD-10-CM | POA: Insufficient documentation

## 2023-02-15 DIAGNOSIS — G629 Polyneuropathy, unspecified: Secondary | ICD-10-CM | POA: Diagnosis not present

## 2023-02-15 LAB — CMP (CANCER CENTER ONLY)
ALT: 23 U/L (ref 0–44)
AST: 29 U/L (ref 15–41)
Albumin: 4.2 g/dL (ref 3.5–5.0)
Alkaline Phosphatase: 60 U/L (ref 38–126)
Anion gap: 7 (ref 5–15)
BUN: 23 mg/dL (ref 8–23)
CO2: 29 mmol/L (ref 22–32)
Calcium: 9.4 mg/dL (ref 8.9–10.3)
Chloride: 103 mmol/L (ref 98–111)
Creatinine: 0.94 mg/dL (ref 0.61–1.24)
GFR, Estimated: >60 mL/min (ref >60)
Glucose, Bld: 151 mg/dL — ABNORMAL HIGH (ref 70–99)
Potassium: 3.8 mmol/L (ref 3.5–5.1)
Sodium: 139 mmol/L (ref 135–145)
Total Bilirubin: 0.8 mg/dL (ref 0.3–1.2)
Total Protein: 6.8 g/dL (ref 6.5–8.1)

## 2023-02-15 LAB — CBC WITH DIFFERENTIAL (CANCER CENTER ONLY)
Abs Immature Granulocytes: 0.02 10*3/uL (ref 0.00–0.07)
Basophils Absolute: 0 10*3/uL (ref 0.0–0.1)
Basophils Relative: 0 %
Eosinophils Absolute: 0.3 10*3/uL (ref 0.0–0.5)
Eosinophils Relative: 5 %
HCT: 44.3 % (ref 39.0–52.0)
Hemoglobin: 15 g/dL (ref 13.0–17.0)
Immature Granulocytes: 0 %
Lymphocytes Relative: 20 %
Lymphs Abs: 1.2 10*3/uL (ref 0.7–4.0)
MCH: 31.5 pg (ref 26.0–34.0)
MCHC: 33.9 g/dL (ref 30.0–36.0)
MCV: 93.1 fL (ref 80.0–100.0)
Monocytes Absolute: 1.1 10*3/uL — ABNORMAL HIGH (ref 0.1–1.0)
Monocytes Relative: 18 %
Neutro Abs: 3.5 10*3/uL (ref 1.7–7.7)
Neutrophils Relative %: 57 %
Platelet Count: 140 10*3/uL — ABNORMAL LOW (ref 150–400)
RBC: 4.76 MIL/uL (ref 4.22–5.81)
RDW: 12.9 % (ref 11.5–15.5)
WBC Count: 6.2 10*3/uL (ref 4.0–10.5)
nRBC: 0 % (ref 0.0–0.2)

## 2023-02-16 ENCOUNTER — Telehealth: Payer: Self-pay | Admitting: *Deleted

## 2023-02-16 NOTE — Telephone Encounter (Signed)
   Pre-operative Risk Assessment    Patient Name: Glen Bolton  DOB: March 25, 1941 MRN: [id number]      Request for Surgical Clearance    Procedure:   GLAUCOMA TUBE SHUNT W/ SCLERAL PATCH GRAFT - LEFT  Date of Surgery:  Clearance 03/07/23                                 Surgeon:  DR. Haynes Hoehn Surgeon's Group or Practice Name:   [facility] Phone number:  [phone] X: [phone] Fax number:  [phone]   Type of Clearance Requested:   - Medical  - Pharmacy:  Hold Rivaroxaban (Xarelto) 2-3 DAYS   Type of Anesthesia:   IV SEDATION   Additional requests/questions:    Signed, Elliot Cousin   02/16/2023, 1:46 PM

## 2023-02-17 DIAGNOSIS — E7800 Pure hypercholesterolemia, unspecified: Secondary | ICD-10-CM | POA: Insufficient documentation

## 2023-02-17 DIAGNOSIS — R2689 Other abnormalities of gait and mobility: Secondary | ICD-10-CM | POA: Insufficient documentation

## 2023-02-17 DIAGNOSIS — D472 Monoclonal gammopathy: Secondary | ICD-10-CM | POA: Insufficient documentation

## 2023-02-17 DIAGNOSIS — D6859 Other primary thrombophilia: Secondary | ICD-10-CM | POA: Insufficient documentation

## 2023-02-17 DIAGNOSIS — I499 Cardiac arrhythmia, unspecified: Secondary | ICD-10-CM | POA: Insufficient documentation

## 2023-02-17 DIAGNOSIS — E1169 Type 2 diabetes mellitus with other specified complication: Secondary | ICD-10-CM | POA: Insufficient documentation

## 2023-02-17 DIAGNOSIS — M519 Unspecified thoracic, thoracolumbar and lumbosacral intervertebral disc disorder: Secondary | ICD-10-CM | POA: Insufficient documentation

## 2023-02-17 NOTE — Telephone Encounter (Signed)
Patient with diagnosis of a fib on Xarelto for anticoagulation.    Procedure: GLAUCOMA TUBE SHUNT W/ SCLERAL PATCH GRAFT - LEFT   Date of procedure: 03/07/23   CHA2DS2-VASc Score = 4  This indicates a 4.8% annual risk of stroke. The patient's score is based upon: CHF History: 0 HTN History: 1 Diabetes History: 1 Stroke History: 0 Vascular Disease History: 0 Age Score: 2 Gender Score: 0  CrCl 107 ml/min Platelet count 140K  Per office protocol, patient can hold Xarelto for 2 days prior to procedure.     **This guidance is not considered finalized until pre-operative APP has relayed final recommendations.**

## 2023-02-17 NOTE — Telephone Encounter (Signed)
   Primary Cardiologist: Rollene Rotunda, MD  Chart reviewed as part of pre-operative protocol coverage. Given past medical history and time since last visit, based on ACC/AHA guidelines, Glen Bolton would be at acceptable risk for the planned procedure without further cardiovascular testing.   Patient was advised that if he develops new symptoms prior to surgery to contact our office to arrange a follow-up appointment.  He verbalized understanding.  Per office protocol, patient can hold Xarelto for 2 days prior to procedure.   I will route this recommendation to the requesting party via Epic fax function and remove from pre-op pool.  Please call with questions.  Levi Aland, NP-C  02/17/2023, 8:24 AM [address], [address] Office [phone] Fax [phone]

## 2023-02-22 ENCOUNTER — Other Ambulatory Visit: Payer: Self-pay

## 2023-02-22 ENCOUNTER — Inpatient Hospital Stay: Payer: Medicare Other | Admitting: Hematology

## 2023-02-22 VITALS — BP 144/91 | HR 57 | Temp 97.3°F | Resp 18 | Wt 258.6 lb

## 2023-02-22 DIAGNOSIS — G4730 Sleep apnea, unspecified: Secondary | ICD-10-CM | POA: Diagnosis not present

## 2023-02-22 DIAGNOSIS — Z86711 Personal history of pulmonary embolism: Secondary | ICD-10-CM | POA: Diagnosis not present

## 2023-02-22 DIAGNOSIS — M129 Arthropathy, unspecified: Secondary | ICD-10-CM | POA: Diagnosis not present

## 2023-02-22 DIAGNOSIS — D472 Monoclonal gammopathy: Secondary | ICD-10-CM | POA: Diagnosis not present

## 2023-02-22 DIAGNOSIS — Z87442 Personal history of urinary calculi: Secondary | ICD-10-CM | POA: Diagnosis not present

## 2023-02-22 DIAGNOSIS — Z79899 Other long term (current) drug therapy: Secondary | ICD-10-CM | POA: Diagnosis not present

## 2023-02-22 DIAGNOSIS — G629 Polyneuropathy, unspecified: Secondary | ICD-10-CM | POA: Diagnosis not present

## 2023-02-22 DIAGNOSIS — R768 Other specified abnormal immunological findings in serum: Secondary | ICD-10-CM

## 2023-02-22 DIAGNOSIS — J320 Chronic maxillary sinusitis: Secondary | ICD-10-CM | POA: Diagnosis not present

## 2023-02-22 DIAGNOSIS — I700 Atherosclerosis of aorta: Secondary | ICD-10-CM | POA: Diagnosis not present

## 2023-02-22 DIAGNOSIS — I4891 Unspecified atrial fibrillation: Secondary | ICD-10-CM | POA: Diagnosis not present

## 2023-02-22 DIAGNOSIS — K219 Gastro-esophageal reflux disease without esophagitis: Secondary | ICD-10-CM | POA: Diagnosis not present

## 2023-02-22 DIAGNOSIS — M1990 Unspecified osteoarthritis, unspecified site: Secondary | ICD-10-CM | POA: Diagnosis not present

## 2023-02-22 DIAGNOSIS — Z7901 Long term (current) use of anticoagulants: Secondary | ICD-10-CM | POA: Diagnosis not present

## 2023-02-24 ENCOUNTER — Other Ambulatory Visit: Payer: Self-pay

## 2023-02-24 ENCOUNTER — Inpatient Hospital Stay: Payer: Medicare Other

## 2023-02-24 DIAGNOSIS — J320 Chronic maxillary sinusitis: Secondary | ICD-10-CM | POA: Diagnosis not present

## 2023-02-24 DIAGNOSIS — Z86711 Personal history of pulmonary embolism: Secondary | ICD-10-CM | POA: Diagnosis not present

## 2023-02-24 DIAGNOSIS — G4730 Sleep apnea, unspecified: Secondary | ICD-10-CM | POA: Diagnosis not present

## 2023-02-24 DIAGNOSIS — K219 Gastro-esophageal reflux disease without esophagitis: Secondary | ICD-10-CM | POA: Diagnosis not present

## 2023-02-24 DIAGNOSIS — Z87442 Personal history of urinary calculi: Secondary | ICD-10-CM | POA: Diagnosis not present

## 2023-02-24 DIAGNOSIS — G629 Polyneuropathy, unspecified: Secondary | ICD-10-CM | POA: Diagnosis not present

## 2023-02-24 DIAGNOSIS — Z7901 Long term (current) use of anticoagulants: Secondary | ICD-10-CM | POA: Diagnosis not present

## 2023-02-24 DIAGNOSIS — D472 Monoclonal gammopathy: Secondary | ICD-10-CM | POA: Diagnosis not present

## 2023-02-24 DIAGNOSIS — M1990 Unspecified osteoarthritis, unspecified site: Secondary | ICD-10-CM | POA: Diagnosis not present

## 2023-02-24 DIAGNOSIS — I700 Atherosclerosis of aorta: Secondary | ICD-10-CM | POA: Diagnosis not present

## 2023-02-24 DIAGNOSIS — R768 Other specified abnormal immunological findings in serum: Secondary | ICD-10-CM

## 2023-02-24 DIAGNOSIS — I4891 Unspecified atrial fibrillation: Secondary | ICD-10-CM | POA: Diagnosis not present

## 2023-02-24 DIAGNOSIS — Z79899 Other long term (current) drug therapy: Secondary | ICD-10-CM | POA: Diagnosis not present

## 2023-02-24 DIAGNOSIS — M129 Arthropathy, unspecified: Secondary | ICD-10-CM | POA: Diagnosis not present

## 2023-02-25 LAB — KAPPA/LAMBDA LIGHT CHAINS
Kappa free light chain: 211.9 mg/L — ABNORMAL HIGH (ref 3.3–19.4)
Kappa, lambda light chain ratio: 12.54 — ABNORMAL HIGH (ref 0.26–1.65)
Lambda free light chains: 16.9 mg/L (ref 5.7–26.3)

## 2023-03-01 DIAGNOSIS — M2012 Hallux valgus (acquired), left foot: Secondary | ICD-10-CM | POA: Diagnosis not present

## 2023-03-01 DIAGNOSIS — M7661 Achilles tendinitis, right leg: Secondary | ICD-10-CM | POA: Diagnosis not present

## 2023-03-01 DIAGNOSIS — Z9181 History of falling: Secondary | ICD-10-CM | POA: Diagnosis not present

## 2023-03-01 DIAGNOSIS — M7731 Calcaneal spur, right foot: Secondary | ICD-10-CM | POA: Diagnosis not present

## 2023-03-01 DIAGNOSIS — M7732 Calcaneal spur, left foot: Secondary | ICD-10-CM | POA: Diagnosis not present

## 2023-03-01 DIAGNOSIS — E1142 Type 2 diabetes mellitus with diabetic polyneuropathy: Secondary | ICD-10-CM | POA: Diagnosis not present

## 2023-03-01 DIAGNOSIS — M722 Plantar fascial fibromatosis: Secondary | ICD-10-CM | POA: Diagnosis not present

## 2023-03-01 LAB — MULTIPLE MYELOMA PANEL, SERUM
Albumin SerPl Elph-Mcnc: 3.6 g/dL (ref 2.9–4.4)
Albumin/Glob SerPl: 1.4 (ref 0.7–1.7)
Alpha 1: 0.2 g/dL (ref 0.0–0.4)
Alpha2 Glob SerPl Elph-Mcnc: 0.8 g/dL (ref 0.4–1.0)
B-Globulin SerPl Elph-Mcnc: 0.8 g/dL (ref 0.7–1.3)
Gamma Glob SerPl Elph-Mcnc: 0.8 g/dL (ref 0.4–1.8)
Globulin, Total: 2.7 g/dL (ref 2.2–3.9)
IgA: 128 mg/dL (ref 61–437)
IgG (Immunoglobin G), Serum: 766 mg/dL (ref 603–1613)
IgM (Immunoglobulin M), Srm: 208 mg/dL — ABNORMAL HIGH (ref 15–143)
Total Protein ELP: 6.3 g/dL (ref 6.0–8.5)

## 2023-03-01 NOTE — Progress Notes (Signed)
HEMATOLOGY/ONCOLOGY CLINIC NOTE  Date of Service: .02/22/2023  Patient Care Team: Darrow Bussing, MD as PCP - General (Family Medicine) Rollene Rotunda, MD as PCP - Cardiology (Cardiology)  CHIEF COMPLAINTS/PURPOSE OF CONSULTATION:  1 year follow-up for light chain MGUS  HISTORY OF PRESENTING ILLNESS:   Glen Bolton is a wonderful 82 y.o. male who has been referred to [place] by my colleague Dr Milinda Antis for evaluation and management of Elevated serum kappa light chains. He is accompanied today by his wife. The pt reports that he is doing well overall.   The pt reports that he had no symptoms before his abnormal lab results prompting his initial visit with Dr Gweneth Dimitri. He denies any concerns for inflammation besides an ear infection for the last 3 weeks and a viral infection as well. He is being followed by his PCP and has had a 20dB drop in his right ear threshold. He has had upper respiratory symptoms including a bad cough that hurt his abdomen which is coupled by a bruise at his lower abdomen. He denies any trauma to the area but does take Xarelto for his Afib. He has been treated with a z-pack and other antibiotics. He has had tubes put in his ear 3 times in the past but denies any ear pain.   He notes that he has arthritis in his knees but denies concern for rheumatoid arthritis.   Of note prior to the patient's visit today, pt has had PET/CT completed on 02/06/18 with results revealing 1. No hypermetabolic mass or adenopathy identified. 2. No evidence for hepatomegaly or splenomegaly. 3. Mild low level FDG uptake throughout the bone marrow is noted. Nonspecific and may be physiologic. 4.  Aortic Atherosclerosis (ICD10-I70.0). 5. Bilateral maxillary sinus disease.   Surgical pathology 02/03/18 revealed HYPERCELLULAR BONE MARROW FOR AGE WITH TRILINEAGE HEMATOPOIESIS. - A MINOR PLASMA CELL COMPONENT WITH KAPPA LIGHT CHAIN EXCESS. - A FEW SMALL LYMPHOID AGGREGATES PRESENT.   Most  recent lab results (02/03/18) of CBC w/ diff  is as follows: all values are WNL. UPEP Light chains 01/26/18 showed Total Protein ur/day at 156, Free kappa lt chains, ur at 93.20, Free lambda lt chains at 7.61, Free Kappa/Lambda ratio at 12.25.  MMP 01/24/18 showed all values WNL except for IgM at 244. M Protein not observed.   On review of systems, pt reports cough, ear infection, lower abdomen superficial bruising, and denies fevers, chills, night sweats, leg swelling, testicular pain or swelling, and any other symptoms.   Interval History:   Glen Bolton is here for his scheduled 4-month follow-up for continued monitoring of his light chain MGUS.  He notes no new bone pains, no fevers no chills no night sweats no unexpected weight loss. He notes no new fatigue.  Good p.o. intake. He notes that he has an upcoming surgery for glaucoma. Recent labs done were discussed in detail with him. MEDICAL HISTORY:  Past Medical History:  Diagnosis Date   Anxiety    Arthritis    Atrial fibrillation, chronic 08/26/2015   Borderline diabetic    Chronic anticoagulation 12/04/2018   CHADS VASC-3-Xarelto   GERD (gastroesophageal reflux disease)    Glaucoma    Hard of hearing    History of gastritis    History of kidney stones    History of stomach ulcers    Hx of migraines    no migraines since he was in his 30's   Hypertension    Osteoarthritis of right knee 12/13/2019  Peripheral neuropathy 12/26/2017   Primary osteoarthritis of left hip 10/25/2015   Sleep apnea    CPAP  not currently working  couldnt breathe with it laying down trying something different end of  April or May 2021    SURGICAL HISTORY: Past Surgical History:  Procedure Laterality Date   CATARACT EXTRACTION Bilateral 2014   COLONOSCOPY     ESOPHAGOGASTRODUODENOSCOPY     EYE SURGERY     INGUINAL HERNIA REPAIR Left    x 2   Rotator cuff surgery Right 2013   TOTAL HIP ARTHROPLASTY Left 10/27/2015   Procedure: TOTAL HIP  ARTHROPLASTY ANTERIOR APPROACH;  Surgeon: Gean Birchwood, MD;  Location: [facility];  Service: Orthopedics;  Laterality: Left;   TOTAL KNEE ARTHROPLASTY Right 12/17/2019   Procedure: RIGHT TOTAL KNEE ARTHROPLASTY;  Surgeon: Gean Birchwood, MD;  Location: [facility];  Service: Orthopedics;  Laterality: Right;    SOCIAL HISTORY: Social History   Socioeconomic History   Marital status: Married    Spouse name: Not on file   Number of children: 2   Years of education: Not on file   Highest education level: Not on file  Occupational History   Occupation: [employer]  Tobacco Use   Smoking status: Never   Smokeless tobacco: Never  Vaping Use   Vaping Use: Never used  Substance and Sexual Activity   Alcohol use: Yes    Alcohol/week: 1.0 standard drink    Types: 1 Glasses of wine per week    Comment: daily   Drug use: No   Sexual activity: Not Currently  Other Topics Concern   Not on file  Social History Narrative   Lives with wife   Caffeine use: 2 cups coffee daily   [employer]   Right handed    Social Determinants of Health   Financial Resource Strain: Not on file  Food Insecurity: Not on file  Transportation Needs: Not on file  Physical Activity: Not on file  Stress: Not on file  Social Connections: Not on file  Intimate Partner Violence: Not on file    FAMILY HISTORY: Family History  Problem Relation Age of Onset   Hyperlipidemia Brother    Stroke Sister 2    ALLERGIES:  has No Known Allergies.  MEDICATIONS:  Current Outpatient Medications  Medication Sig Dispense Refill   albuterol (VENTOLIN HFA) 108 (90 Base) MCG/ACT inhaler 2 puffs as needed     albuterol (VENTOLIN HFA) 108 (90 Base) MCG/ACT inhaler SMARTSIG:2 Puff(s) By Mouth Every 4 Hours PRN     chlorthalidone (HYGROTON) 25 MG tablet Take 25 mg by mouth every morning.     dorzolamide (TRUSOPT) 2 % ophthalmic solution 1 drop into affected eye     dorzolamide-timolol (COSOPT) 22.3-6.8 MG/ML ophthalmic solution Place 1  drop into both eyes 2 (two) times daily.   8   hydrochlorothiazide (HYDRODIURIL) 12.5 MG tablet Take 12.5 mg by mouth every evening.      methylPREDNISolone (MEDROL DOSEPAK) 4 MG TBPK tablet 6 day dose pack - take as directed 21 tablet 0   metoprolol succinate (TOPROL-XL) 50 MG 24 hr tablet TAKE 1 TABLET(50 MG) BY MOUTH DAILY 90 tablet 3   Multiple Vitamin (MULTIVITAMIN WITH MINERALS) TABS tablet Take 1 tablet by mouth daily.     olmesartan (BENICAR) 40 MG tablet Take 40 mg by mouth daily.   4   Probiotic Product (PROBIOTIC DAILY PO) Take 1 capsule by mouth every evening.     rivaroxaban (XARELTO) 20 MG  TABS tablet TAKE 1 TABLET(20 MG) BY MOUTH DAILY WITH SUPPER 30 tablet 5   triamcinolone (NASACORT) 55 MCG/ACT AERO nasal inhaler Place 2 sprays into the nose daily. 2 sprays each nostril at night on a regular basis 1 each 12   No current facility-administered medications for this visit.    REVIEW OF SYSTEMS:   .10 Point review of Systems was done is negative except as noted above.   PHYSICAL EXAMINATION: .BP (!) 132/91   Pulse (!) 54   Temp 97.6 F (36.4 C)   Resp 18   Wt 265 lb 9.6 oz (120.5 kg)   SpO2 97%   BMI 38.11 kg/m   . GENERAL:alert, in no acute distress and comfortable SKIN: no acute rashes, no significant lesions EYES: conjunctiva are pink and non-injected, sclera anicteric OROPHARYNX: MMM, no exudates, no oropharyngeal erythema or ulceration NECK: supple, no JVD LYMPH:  no palpable lymphadenopathy in the cervical, axillary or inguinal regions LUNGS: clear to auscultation b/l with normal respiratory effort HEART: regular rate & rhythm ABDOMEN:  normoactive bowel sounds , non tender, not distended. Extremity: no pedal edema PSYCH: alert & oriented x 3 with fluent speech NEURO: no focal motor/sensory deficits   LABORATORY DATA:  I have reviewed the data as listed  .    Latest Ref Rng & Units 02/15/2023    8:39 AM 01/21/2022    9:39 AM 12/12/2020   11:47 AM  CBC   WBC 4.0 - 10.5 K/uL 6.2  6.0  6.8   Hemoglobin 13.0 - 17.0 g/dL 78.2  95.6  21.3   Hematocrit 39.0 - 52.0 % 44.3  43.7  46.2   Platelets 150 - 400 K/uL 140  156  154     .    Latest Ref Rng & Units 02/15/2023    8:39 AM 01/21/2022    9:39 AM 12/12/2020   11:47 AM  CMP  Glucose 70 - 99 mg/dL 086  578  469   BUN 8 - 23 mg/dL 23  27  21    Creatinine 0.61 - 1.24 mg/dL 6.29  5.28  4.13   Sodium 135 - 145 mmol/L 139  140  141   Potassium 3.5 - 5.1 mmol/L 3.8  3.9  4.1   Chloride 98 - 111 mmol/L 103  102  106   CO2 22 - 32 mmol/L 29  33  23   Calcium 8.9 - 10.3 mg/dL 9.4  9.3  8.8   Total Protein 6.5 - 8.1 g/dL 6.8  7.0  6.9   Total Bilirubin 0.3 - 1.2 mg/dL 0.8  1.1  0.8   Alkaline Phos 38 - 126 U/L 60  63  68   AST 15 - 41 U/L 29  30  29    ALT 0 - 44 U/L 23  31  31        02/03/18 BM Report:    02/03/18 Flow Cytometry:   02/03/18 Cytogenetics:    RADIOGRAPHIC STUDIES: I have personally reviewed the radiological images as listed and agreed with the findings in the report. No results found.  ASSESSMENT & PLAN:   82 y.o. male with  1. Light chain MGUS  PET/CT from 02/06/18 did not reveal any bone lesions and revealed Mild low level FDG uptake throughout the bone marrow is noted.  Labs upon initial presentation from 02/03/18 and 01/24/18, Free kappa lt chains in the urine were elevated at 93.20. No anemia, no abnormal kidney functions. Nonspecific and may be physiologic.  IgM  increase noted without M-spike 02/03/18 BM bx pathology indicated 2% plasma cells 02/03/18 Cytogenetics showed normal genetics without specific mutation Slow moving lymphoma or plasma cell disorder vs reactive to chronic inflammation with frequent sinus and ear infections ?  PLAN: -Patient has no symptoms suggestive of progression to multiple myeloma or other symptomatic plasma cell dyscrasia. -Recent CBC on 02/15/2023 showed no anemia with a normal hemoglobin of 15 and normal WBC count of 6.2k and near  normal platelets of 140k -CMP on 02/15/2023 showed stable results with no changes in kidney function and no hypercalcemia. -Serum kappa light chain still remain elevated at 211 with a kappa lambda ratio of 12.5.  This is quite stable compared to a year ago with no significant progression. -Multiple myeloma panel is currently pending but do not anticipate any significant changes. -No indication for repeat PET scan or bone marrow evaluation at this time. -Would recommend continued yearly lab monitoring unless new symptoms arise prior to that. -Continue to follow with primary care physician for management of his other medical comorbidities. -No evidence of bleeding dyscrasias related to this. -Patient okay to proceed with planned Eye procedure from a perspective of his light chain MGUS.  Form filled and signed and faxed to his ophthalmologist.  FOLLOW UP:  Labs on Thursday 6/20 RTC with Dr Candise Che in 11 months Labs 1 week prior to visit   The total time spent in the appointment was 20 minutes*.  All of the patient's questions were answered with apparent satisfaction. The patient knows to call the clinic with any problems, questions or concerns.   Wyvonnia Lora MD MS AAHIVMS [facility] Hematology/Oncology Physician [facility]  .*Total Encounter Time as defined by the Centers for Medicare and Medicaid Services includes, in addition to the face-to-face time of a patient visit (documented in the note above) non-face-to-face time: obtaining and reviewing outside history, ordering and reviewing medications, tests or procedures, care coordination (communications with other health care professionals or caregivers) and documentation in the medical record.

## 2023-03-03 ENCOUNTER — Telehealth: Payer: Self-pay

## 2023-03-03 NOTE — Telephone Encounter (Signed)
Contacted pt per Dr Candise Che to : let patient know his Light chains and K/L light chain ratio is stable. Pt acknowledged information and verbalized understanding.

## 2023-03-07 DIAGNOSIS — H401123 Primary open-angle glaucoma, left eye, severe stage: Secondary | ICD-10-CM | POA: Diagnosis not present

## 2023-04-06 DIAGNOSIS — Z03818 Encounter for observation for suspected exposure to other biological agents ruled out: Secondary | ICD-10-CM | POA: Diagnosis not present

## 2023-04-06 DIAGNOSIS — J069 Acute upper respiratory infection, unspecified: Secondary | ICD-10-CM | POA: Diagnosis not present

## 2023-04-06 DIAGNOSIS — D6869 Other thrombophilia: Secondary | ICD-10-CM | POA: Diagnosis not present

## 2023-05-02 DIAGNOSIS — I4891 Unspecified atrial fibrillation: Secondary | ICD-10-CM | POA: Diagnosis not present

## 2023-05-02 DIAGNOSIS — K219 Gastro-esophageal reflux disease without esophagitis: Secondary | ICD-10-CM | POA: Diagnosis not present

## 2023-05-02 DIAGNOSIS — M79606 Pain in leg, unspecified: Secondary | ICD-10-CM | POA: Diagnosis not present

## 2023-05-12 DIAGNOSIS — L814 Other melanin hyperpigmentation: Secondary | ICD-10-CM | POA: Diagnosis not present

## 2023-05-12 DIAGNOSIS — D225 Melanocytic nevi of trunk: Secondary | ICD-10-CM | POA: Diagnosis not present

## 2023-05-12 DIAGNOSIS — L821 Other seborrheic keratosis: Secondary | ICD-10-CM | POA: Diagnosis not present

## 2023-05-12 DIAGNOSIS — I8392 Asymptomatic varicose veins of left lower extremity: Secondary | ICD-10-CM | POA: Diagnosis not present

## 2023-05-12 DIAGNOSIS — L570 Actinic keratosis: Secondary | ICD-10-CM | POA: Diagnosis not present

## 2023-05-23 DIAGNOSIS — Z23 Encounter for immunization: Secondary | ICD-10-CM | POA: Diagnosis not present

## 2023-05-23 DIAGNOSIS — Z0001 Encounter for general adult medical examination with abnormal findings: Secondary | ICD-10-CM | POA: Diagnosis not present

## 2023-05-23 DIAGNOSIS — E1142 Type 2 diabetes mellitus with diabetic polyneuropathy: Secondary | ICD-10-CM | POA: Diagnosis not present

## 2023-05-23 DIAGNOSIS — Z79899 Other long term (current) drug therapy: Secondary | ICD-10-CM | POA: Diagnosis not present

## 2023-05-23 DIAGNOSIS — E7800 Pure hypercholesterolemia, unspecified: Secondary | ICD-10-CM | POA: Diagnosis not present

## 2023-05-23 DIAGNOSIS — E1165 Type 2 diabetes mellitus with hyperglycemia: Secondary | ICD-10-CM | POA: Diagnosis not present

## 2023-05-23 DIAGNOSIS — I700 Atherosclerosis of aorta: Secondary | ICD-10-CM | POA: Diagnosis not present

## 2023-05-23 DIAGNOSIS — I272 Pulmonary hypertension, unspecified: Secondary | ICD-10-CM | POA: Diagnosis not present

## 2023-05-23 DIAGNOSIS — I4891 Unspecified atrial fibrillation: Secondary | ICD-10-CM | POA: Diagnosis not present

## 2023-05-23 DIAGNOSIS — I77810 Thoracic aortic ectasia: Secondary | ICD-10-CM | POA: Diagnosis not present

## 2023-05-23 DIAGNOSIS — D6869 Other thrombophilia: Secondary | ICD-10-CM | POA: Diagnosis not present

## 2023-05-31 ENCOUNTER — Other Ambulatory Visit: Payer: Self-pay | Admitting: Cardiology

## 2023-07-12 ENCOUNTER — Other Ambulatory Visit: Payer: Self-pay | Admitting: Cardiology

## 2023-07-12 DIAGNOSIS — I482 Chronic atrial fibrillation, unspecified: Secondary | ICD-10-CM

## 2023-07-12 NOTE — Telephone Encounter (Signed)
Prescription refill request for Xarelto received.  Indication:afib Last office visit:4/24 Weight:117.3  kg Age:82 Scr:0.94  6/24 CrCl:103.99  ml/min  Prescription refilled

## 2023-07-19 DIAGNOSIS — H401133 Primary open-angle glaucoma, bilateral, severe stage: Secondary | ICD-10-CM | POA: Diagnosis not present

## 2023-07-28 ENCOUNTER — Telehealth: Payer: Self-pay | Admitting: *Deleted

## 2023-07-28 DIAGNOSIS — I4891 Unspecified atrial fibrillation: Secondary | ICD-10-CM | POA: Diagnosis not present

## 2023-07-28 DIAGNOSIS — Z7901 Long term (current) use of anticoagulants: Secondary | ICD-10-CM | POA: Diagnosis not present

## 2023-07-28 NOTE — Telephone Encounter (Signed)
   Pre-operative Risk Assessment    Patient Name: Glen Bolton  DOB: Oct 17, 1940 MRN: [id number]  DATE OF LAST VISIT: 12/29/22 Carlos Levering, NP DATE OF NEXT VISIT: NONE    Request for Surgical Clearance    Procedure:   COLONOSCOPY  Date of Surgery:  Clearance 08/25/23                                 Surgeon:  DR. Matthias Hughs Surgeon's Group or Practice Name:  [facility] Phone number:  [phone] Fax number:  [phone]   Type of Clearance Requested:   - Medical  - Pharmacy:  Hold Rivaroxaban (Xarelto)     Type of Anesthesia:   PROPOFOL   Additional requests/questions:    Elpidio Anis   07/28/2023, 4:31 PM

## 2023-08-01 NOTE — Telephone Encounter (Addendum)
Patient with diagnosis of afib on Xarelto  for anticoagulation.    Procedure:   COLONOSCOPY  Date of procedure: 08/25/2023   CHA2DS2-VASc Score = 4   This indicates a 4.8% annual risk of stroke. The patient's score is based upon: CHF History: 0 HTN History: 1 Diabetes History: 1 Stroke History: 0 Vascular Disease History: 0 Age Score: 2 Gender Score: 0    CrCl 85mL/min (02/15/2023) Platelet count 140 K (02/15/2023)  Per office protocol, patient can hold Xarelto  for 2 days prior to procedure.     **This guidance is not considered finalized until pre-operative APP has relayed final recommendations.**

## 2023-08-09 NOTE — Telephone Encounter (Signed)
Tried to reach the pt to schedule a tele pre op appt. No answer.

## 2023-08-09 NOTE — Telephone Encounter (Signed)
Primary Cardiologist:James Hochrein, MD   Preoperative team, please contact this patient and set up a phone call appointment for further preoperative risk assessment. Please obtain consent and complete medication review. Thank you for your help.   Per office protocol, patient can hold Eliquis for 2 days prior to procedure.    I also confirmed the patient resides in the state of [place]. As per [facility] Medical Board telemedicine laws, the patient must reside in the state in which the provider is licensed.   Levi Aland, NP-C  08/09/2023, 8:12 AM [address], [address] Office [phone] Fax [phone]

## 2023-08-11 ENCOUNTER — Telehealth: Payer: Self-pay | Admitting: *Deleted

## 2023-08-11 NOTE — Telephone Encounter (Signed)
 Pt has been scheduled tele pre op appt 08/18/23. Med rec and consent are done.

## 2023-08-11 NOTE — Telephone Encounter (Signed)
Pt has been scheduled tele pre op appt 08/18/23. Med rec and consent are done.     Patient Consent for Virtual Visit        Glen Bolton has provided verbal consent on 08/11/2023 for a virtual visit (video or telephone).   CONSENT FOR VIRTUAL VISIT FOR:  Glen Bolton  By participating in this virtual visit I agree to the following:  I hereby voluntarily request, consent and authorize [facility] and its employed or contracted physicians, physician assistants, nurse practitioners or other licensed health care professionals (the Practitioner), to provide me with telemedicine health care services (the "Services") as deemed necessary by the treating Practitioner. I acknowledge and consent to receive the Services by the Practitioner via telemedicine. I understand that the telemedicine visit will involve communicating with the Practitioner through live audiovisual communication technology and the disclosure of certain medical information by electronic transmission. I acknowledge that I have been given the opportunity to request an in-person assessment or other available alternative prior to the telemedicine visit and am voluntarily participating in the telemedicine visit.  I understand that I have the right to withhold or withdraw my consent to the use of telemedicine in the course of my care at any time, without affecting my right to future care or treatment, and that the Practitioner or I may terminate the telemedicine visit at any time. I understand that I have the right to inspect all information obtained and/or recorded in the course of the telemedicine visit and may receive copies of available information for a reasonable fee.  I understand that some of the potential risks of receiving the Services via telemedicine include:  Delay or interruption in medical evaluation due to technological equipment failure or disruption; Information transmitted may not be sufficient (e.g. poor resolution of  images) to allow for appropriate medical decision making by the Practitioner; and/or  In rare instances, security protocols could fail, causing a breach of personal health information.  Furthermore, I acknowledge that it is my responsibility to provide information about my medical history, conditions and care that is complete and accurate to the best of my ability. I acknowledge that Practitioner's advice, recommendations, and/or decision may be based on factors not within their control, such as incomplete or inaccurate data provided by me or distortions of diagnostic images or specimens that may result from electronic transmissions. I understand that the practice of medicine is not an exact science and that Practitioner makes no warranties or guarantees regarding treatment outcomes. I acknowledge that a copy of this consent can be made available to me via my patient portal [facility] MyChart), or I can request a printed copy by calling the office of [facility].    I understand that my insurance will be billed for this visit.   I have read or had this consent read to me. I understand the contents of this consent, which adequately explains the benefits and risks of the Services being provided via telemedicine.  I have been provided ample opportunity to ask questions regarding this consent and the Services and have had my questions answered to my satisfaction. I give my informed consent for the services to be provided through the use of telemedicine in my medical care

## 2023-08-15 NOTE — Progress Notes (Unsigned)
Virtual Visit via Telephone Note   Because of Glen Bolton's co-morbid illnesses, he is at least at moderate risk for complications without adequate follow up.  This format is felt to be most appropriate for this patient at this time.  The patient did not have access to video technology/had technical difficulties with video requiring transitioning to audio format only (telephone).  All issues noted in this document were discussed and addressed.  No physical exam could be performed with this format.  Please refer to the patient's chart for his consent to telehealth for [facility].  Evaluation Performed:  Preoperative cardiovascular risk assessment _____________   Date:  08/15/2023   Patient ID:  Glen Bolton, DOB 09-27-1940, MRN [id number] Patient Location:  Home Provider location:   Office  Primary Care Provider:  Darrow Bussing, MD Primary Cardiologist:  Rollene Rotunda, MD  Chief Complaint / Patient Profile   82 y.o. y/o male with a h/o permanent atrial fibrillation, pulmonary . hypertension hypertension, and OSA on CPAP.  He is pending colonoscopy by Dr. Matthias Hughs of [facility] and presents today for telephonic preoperative cardiovascular risk assessment along with recommendations for holding Xarelto.  History of Present Illness    Glen Bolton is a 82 y.o. male who presents via audio/video conferencing for a telehealth visit today.  Pt was last seen in cardiology clinic on 12/29/2022 by Carlos Levering, DNP  At that time Ryland Michalski was doing well .  The patient is now pending procedure as outlined above. Since his last visit, he ***  Past Medical History    Past Medical History:  Diagnosis Date   Anxiety    Arthritis    Atrial fibrillation, chronic 08/26/2015   Borderline diabetic    Chronic anticoagulation 12/04/2018   CHADS VASC-3-Xarelto   GERD (gastroesophageal reflux disease)    Glaucoma    Hard of hearing    History of gastritis    History of kidney stones     History of stomach ulcers    Hx of migraines    no migraines since he was in his 30's   Hypertension    Osteoarthritis of right knee 12/13/2019   Peripheral neuropathy 12/26/2017   Primary osteoarthritis of left hip 10/25/2015   Sleep apnea    CPAP  not currently working  couldnt breathe with it laying down trying something different end of  April or May 2021   Past Surgical History:  Procedure Laterality Date   CATARACT EXTRACTION Bilateral 2014   COLONOSCOPY     ESOPHAGOGASTRODUODENOSCOPY     EYE SURGERY     INGUINAL HERNIA REPAIR Left    x 2   Rotator cuff surgery Right 2013   TOTAL HIP ARTHROPLASTY Left 10/27/2015   Procedure: TOTAL HIP ARTHROPLASTY ANTERIOR APPROACH;  Surgeon: Gean Birchwood, MD;  Location: [facility] OR;  Service: Orthopedics;  Laterality: Left;   TOTAL KNEE ARTHROPLASTY Right 12/17/2019   Procedure: RIGHT TOTAL KNEE ARTHROPLASTY;  Surgeon: Gean Birchwood, MD;  Location: [facility];  Service: Orthopedics;  Laterality: Right;    Allergies  No Known Allergies  Home Medications    Prior to Admission medications   Medication Sig Start Date End Date Taking? Authorizing Provider  albuterol (VENTOLIN HFA) 108 (90 Base) MCG/ACT inhaler 2 puffs as needed 12/29/20   [provider]  albuterol (VENTOLIN HFA) 108 (90 Base) MCG/ACT inhaler SMARTSIG:2 Puff(s) By Mouth Every 4 Hours PRN 12/29/20   [provider]  chlorthalidone (HYGROTON) 25 MG tablet Take 25  mg by mouth every morning. 10/21/21   [provider]  dorzolamide (TRUSOPT) 2 % ophthalmic solution 1 drop into affected eye    [provider]  dorzolamide-timolol (COSOPT) 22.3-6.8 MG/ML ophthalmic solution Place 1 drop into both eyes 2 (two) times daily.  06/20/18   [provider]  hydrochlorothiazide (HYDRODIURIL) 12.5 MG tablet Take 12.5 mg by mouth every evening.     [provider]  metFORMIN (GLUCOPHAGE) 500 MG tablet Take 500 mg by mouth 2 (two) times daily with a meal.  11/19/22   [provider]  methylPREDNISolone (MEDROL DOSEPAK) 4 MG TBPK tablet 6 day dose pack - take as directed 06/29/21   Edwin Cap, DPM  metoprolol succinate (TOPROL-XL) 50 MG 24 hr tablet TAKE 1 TABLET(50 MG) BY MOUTH DAILY 05/31/23   Rollene Rotunda, MD  Multiple Vitamin (MULTIVITAMIN WITH MINERALS) TABS tablet Take 1 tablet by mouth daily.    [provider]  olmesartan (BENICAR) 40 MG tablet Take 40 mg by mouth daily.  05/23/17   [provider]  Probiotic Product (PROBIOTIC DAILY PO) Take 1 capsule by mouth every evening.    [provider]  triamcinolone (NASACORT) 55 MCG/ACT AERO nasal inhaler Place 2 sprays into the nose daily. 2 sprays each nostril at night on a regular basis 06/23/21   Drema Halon, MD  XARELTO 20 MG TABS tablet TAKE 1 TABLET(20 MG) BY MOUTH DAILY WITH SUPPER 07/12/23   Rollene Rotunda, MD    Physical Exam    Vital Signs:  Welford Guzek does not have vital signs available for review today.***  Given telephonic nature of communication, physical exam is limited. AAOx3. NAD. Normal affect.  Speech and respirations are unlabored.  Accessory Clinical Findings    None  Assessment & Plan    1.  Preoperative Cardiovascular Risk Assessment:  According to the Revised Cardiac Risk Index (RCRI), his    His   according to the Duke Activity Status Index (DASI).   Per office protocol, patient can hold Xarelto  for 2 days prior to procedure.    Therefore, based on ACC/AHA guidelines, patient would be at acceptable risk for the planned procedure without further cardiovascular testing. I will route this recommendation to the requesting party via Epic fax function.   The patient was advised that if he develops new symptoms prior to surgery to contact our office to arrange for a follow-up visit, and he verbalized understanding.  A copy of this note will be routed to requesting surgeon.  Time:   Today, I have spent ***  minutes with the patient with telehealth technology discussing medical history, symptoms, and management plan.     Joni Reining, NP  08/15/2023, 2:45 PM

## 2023-08-18 ENCOUNTER — Ambulatory Visit: Payer: Medicare Other | Attending: Cardiovascular Disease

## 2023-08-18 DIAGNOSIS — Z01810 Encounter for preprocedural cardiovascular examination: Secondary | ICD-10-CM

## 2023-08-18 DIAGNOSIS — Z01818 Encounter for other preprocedural examination: Secondary | ICD-10-CM

## 2023-08-23 DIAGNOSIS — H401133 Primary open-angle glaucoma, bilateral, severe stage: Secondary | ICD-10-CM | POA: Diagnosis not present

## 2023-08-25 DIAGNOSIS — Z1211 Encounter for screening for malignant neoplasm of colon: Secondary | ICD-10-CM | POA: Diagnosis not present

## 2023-08-25 DIAGNOSIS — D123 Benign neoplasm of transverse colon: Secondary | ICD-10-CM | POA: Diagnosis not present

## 2023-08-25 DIAGNOSIS — K621 Rectal polyp: Secondary | ICD-10-CM | POA: Diagnosis not present

## 2023-08-29 DIAGNOSIS — K621 Rectal polyp: Secondary | ICD-10-CM | POA: Diagnosis not present

## 2023-08-29 DIAGNOSIS — D123 Benign neoplasm of transverse colon: Secondary | ICD-10-CM | POA: Diagnosis not present

## 2023-08-30 DIAGNOSIS — Z79899 Other long term (current) drug therapy: Secondary | ICD-10-CM | POA: Diagnosis not present

## 2023-10-05 DIAGNOSIS — H401133 Primary open-angle glaucoma, bilateral, severe stage: Secondary | ICD-10-CM | POA: Diagnosis not present

## 2023-10-13 DIAGNOSIS — M7661 Achilles tendinitis, right leg: Secondary | ICD-10-CM | POA: Diagnosis not present

## 2023-10-13 DIAGNOSIS — M2012 Hallux valgus (acquired), left foot: Secondary | ICD-10-CM | POA: Diagnosis not present

## 2023-10-13 DIAGNOSIS — M722 Plantar fascial fibromatosis: Secondary | ICD-10-CM | POA: Diagnosis not present

## 2023-10-13 DIAGNOSIS — M21622 Bunionette of left foot: Secondary | ICD-10-CM | POA: Diagnosis not present

## 2023-10-13 DIAGNOSIS — M792 Neuralgia and neuritis, unspecified: Secondary | ICD-10-CM | POA: Diagnosis not present

## 2023-10-13 DIAGNOSIS — M7732 Calcaneal spur, left foot: Secondary | ICD-10-CM | POA: Diagnosis not present

## 2023-10-13 DIAGNOSIS — M7731 Calcaneal spur, right foot: Secondary | ICD-10-CM | POA: Diagnosis not present

## 2023-11-07 DIAGNOSIS — H90A21 Sensorineural hearing loss, unilateral, right ear, with restricted hearing on the contralateral side: Secondary | ICD-10-CM | POA: Diagnosis not present

## 2023-11-09 DIAGNOSIS — L570 Actinic keratosis: Secondary | ICD-10-CM | POA: Diagnosis not present

## 2023-11-09 DIAGNOSIS — L814 Other melanin hyperpigmentation: Secondary | ICD-10-CM | POA: Diagnosis not present

## 2023-11-09 DIAGNOSIS — Z08 Encounter for follow-up examination after completed treatment for malignant neoplasm: Secondary | ICD-10-CM | POA: Diagnosis not present

## 2023-11-09 DIAGNOSIS — L821 Other seborrheic keratosis: Secondary | ICD-10-CM | POA: Diagnosis not present

## 2023-11-09 DIAGNOSIS — Z85828 Personal history of other malignant neoplasm of skin: Secondary | ICD-10-CM | POA: Diagnosis not present

## 2023-11-09 DIAGNOSIS — D225 Melanocytic nevi of trunk: Secondary | ICD-10-CM | POA: Diagnosis not present

## 2023-11-10 DIAGNOSIS — E119 Type 2 diabetes mellitus without complications: Secondary | ICD-10-CM | POA: Diagnosis not present

## 2023-11-10 DIAGNOSIS — R053 Chronic cough: Secondary | ICD-10-CM | POA: Diagnosis not present

## 2023-11-10 DIAGNOSIS — R0981 Nasal congestion: Secondary | ICD-10-CM | POA: Diagnosis not present

## 2023-11-24 DIAGNOSIS — I272 Pulmonary hypertension, unspecified: Secondary | ICD-10-CM | POA: Diagnosis not present

## 2023-11-24 DIAGNOSIS — I4891 Unspecified atrial fibrillation: Secondary | ICD-10-CM | POA: Diagnosis not present

## 2023-11-24 DIAGNOSIS — Z79899 Other long term (current) drug therapy: Secondary | ICD-10-CM | POA: Diagnosis not present

## 2023-11-24 DIAGNOSIS — D472 Monoclonal gammopathy: Secondary | ICD-10-CM | POA: Diagnosis not present

## 2023-11-24 DIAGNOSIS — E1165 Type 2 diabetes mellitus with hyperglycemia: Secondary | ICD-10-CM | POA: Diagnosis not present

## 2023-11-30 DIAGNOSIS — H6522 Chronic serous otitis media, left ear: Secondary | ICD-10-CM | POA: Diagnosis not present

## 2023-11-30 DIAGNOSIS — H90A32 Mixed conductive and sensorineural hearing loss, unilateral, left ear with restricted hearing on the contralateral side: Secondary | ICD-10-CM | POA: Diagnosis not present

## 2023-12-12 DIAGNOSIS — H6522 Chronic serous otitis media, left ear: Secondary | ICD-10-CM | POA: Diagnosis not present

## 2023-12-12 DIAGNOSIS — H90A32 Mixed conductive and sensorineural hearing loss, unilateral, left ear with restricted hearing on the contralateral side: Secondary | ICD-10-CM | POA: Diagnosis not present

## 2024-01-13 ENCOUNTER — Other Ambulatory Visit: Payer: Self-pay

## 2024-01-13 DIAGNOSIS — D472 Monoclonal gammopathy: Secondary | ICD-10-CM

## 2024-01-13 DIAGNOSIS — R768 Other specified abnormal immunological findings in serum: Secondary | ICD-10-CM

## 2024-01-16 ENCOUNTER — Inpatient Hospital Stay: Payer: Medicare Other | Attending: Hematology

## 2024-01-16 DIAGNOSIS — D472 Monoclonal gammopathy: Secondary | ICD-10-CM | POA: Insufficient documentation

## 2024-01-16 DIAGNOSIS — R768 Other specified abnormal immunological findings in serum: Secondary | ICD-10-CM

## 2024-01-16 LAB — CMP (CANCER CENTER ONLY)
ALT: 21 U/L (ref 0–44)
AST: 23 U/L (ref 15–41)
Albumin: 4.2 g/dL (ref 3.5–5.0)
Alkaline Phosphatase: 56 U/L (ref 38–126)
Anion gap: 5 (ref 5–15)
BUN: 24 mg/dL — ABNORMAL HIGH (ref 8–23)
CO2: 31 mmol/L (ref 22–32)
Calcium: 9.4 mg/dL (ref 8.9–10.3)
Chloride: 102 mmol/L (ref 98–111)
Creatinine: 0.84 mg/dL (ref 0.61–1.24)
GFR, Estimated: >60 mL/min (ref >60)
Glucose, Bld: 154 mg/dL — ABNORMAL HIGH (ref 70–99)
Potassium: 3.6 mmol/L (ref 3.5–5.1)
Sodium: 138 mmol/L (ref 135–145)
Total Bilirubin: 0.9 mg/dL (ref 0.0–1.2)
Total Protein: 6.9 g/dL (ref 6.5–8.1)

## 2024-01-16 LAB — CBC WITH DIFFERENTIAL (CANCER CENTER ONLY)
Abs Immature Granulocytes: 0.02 10*3/uL (ref 0.00–0.07)
Basophils Absolute: 0 10*3/uL (ref 0.0–0.1)
Basophils Relative: 1 %
Eosinophils Absolute: 0.2 10*3/uL (ref 0.0–0.5)
Eosinophils Relative: 4 %
HCT: 42.6 % (ref 39.0–52.0)
Hemoglobin: 14.5 g/dL (ref 13.0–17.0)
Immature Granulocytes: 0 %
Lymphocytes Relative: 23 %
Lymphs Abs: 1.5 10*3/uL (ref 0.7–4.0)
MCH: 31.3 pg (ref 26.0–34.0)
MCHC: 34 g/dL (ref 30.0–36.0)
MCV: 91.8 fL (ref 80.0–100.0)
Monocytes Absolute: 0.9 10*3/uL (ref 0.1–1.0)
Monocytes Relative: 14 %
Neutro Abs: 3.8 10*3/uL (ref 1.7–7.7)
Neutrophils Relative %: 58 %
Platelet Count: 172 10*3/uL (ref 150–400)
RBC: 4.64 MIL/uL (ref 4.22–5.81)
RDW: 12.8 % (ref 11.5–15.5)
WBC Count: 6.4 10*3/uL (ref 4.0–10.5)
nRBC: 0 % (ref 0.0–0.2)

## 2024-01-17 LAB — KAPPA/LAMBDA LIGHT CHAINS
Kappa free light chain: 211 mg/L — ABNORMAL HIGH (ref 3.3–19.4)
Kappa, lambda light chain ratio: 13.02 — ABNORMAL HIGH (ref 0.26–1.65)
Lambda free light chains: 16.2 mg/L (ref 5.7–26.3)

## 2024-01-18 ENCOUNTER — Other Ambulatory Visit: Payer: Self-pay | Admitting: Cardiology

## 2024-01-18 DIAGNOSIS — I482 Chronic atrial fibrillation, unspecified: Secondary | ICD-10-CM

## 2024-01-18 NOTE — Telephone Encounter (Signed)
 Prescription refill request for Xarelto  received.  Indication:afib Last office visit:12/24 Weight:117.3  kg Age:83 Scr:0.84  5/25 CrCl:114.43  ml/min  Prescription refilled

## 2024-01-20 LAB — MULTIPLE MYELOMA PANEL, SERUM
Albumin SerPl Elph-Mcnc: 3.7 g/dL (ref 2.9–4.4)
Albumin/Glob SerPl: 1.4 (ref 0.7–1.7)
Alpha 1: 0.2 g/dL (ref 0.0–0.4)
Alpha2 Glob SerPl Elph-Mcnc: 0.8 g/dL (ref 0.4–1.0)
B-Globulin SerPl Elph-Mcnc: 0.8 g/dL (ref 0.7–1.3)
Gamma Glob SerPl Elph-Mcnc: 0.9 g/dL (ref 0.4–1.8)
Globulin, Total: 2.8 g/dL (ref 2.2–3.9)
IgA: 147 mg/dL (ref 61–437)
IgG (Immunoglobin G), Serum: 899 mg/dL (ref 603–1613)
IgM (Immunoglobulin M), Srm: 213 mg/dL — ABNORMAL HIGH (ref 15–143)
Total Protein ELP: 6.5 g/dL (ref 6.0–8.5)

## 2024-01-22 NOTE — Progress Notes (Signed)
 HEMATOLOGY/ONCOLOGY CLINIC NOTE  Date of Service: 01/23/2024  Patient Care Team: Lanae Pinal, MD as PCP - General (Family Medicine) Eilleen Grates, MD as PCP - Cardiology (Cardiology)  CHIEF COMPLAINTS/PURPOSE OF CONSULTATION:  1 year follow-up for light chain MGUS  HISTORY OF PRESENTING ILLNESS:   Glen Bolton is a wonderful 83 y.o. male who has been referred to us  by my colleague Dr Cyrilla Drivers for evaluation and management of Elevated serum kappa light chains. He is accompanied today by his wife. The pt reports that he is doing well overall.   The pt reports that he had no symptoms before his abnormal lab results prompting his initial visit with Dr Arleta Bench. He denies any concerns for inflammation besides an ear infection for the last 3 weeks and a viral infection as well. He is being followed by his PCP and has had a 20dB drop in his right ear threshold. He has had upper respiratory symptoms including a bad cough that hurt his abdomen which is coupled by a bruise at his lower abdomen. He denies any trauma to the area but does take Xarelto  for his Afib. He has been treated with a z-pack and other antibiotics. He has had tubes put in his ear 3 times in the past but denies any ear pain.   He notes that he has arthritis in his knees but denies concern for rheumatoid arthritis.   Of note prior to the patient's visit today, pt has had PET/CT completed on 02/06/18 with results revealing 1. No hypermetabolic mass or adenopathy identified. 2. No evidence for hepatomegaly or splenomegaly. 3. Mild low level FDG uptake throughout the bone marrow is noted. Nonspecific and may be physiologic. 4.  Aortic Atherosclerosis (ICD10-I70.0). 5. Bilateral maxillary sinus disease.   Surgical pathology 02/03/18 revealed HYPERCELLULAR BONE MARROW FOR AGE WITH TRILINEAGE HEMATOPOIESIS. - A MINOR PLASMA CELL COMPONENT WITH KAPPA LIGHT CHAIN EXCESS. - A FEW SMALL LYMPHOID AGGREGATES PRESENT.   Most recent  lab results (02/03/18) of CBC w/ diff  is as follows: all values are WNL. UPEP Light chains 01/26/18 showed Total Protein ur/day at 156, Free kappa lt chains, ur at 93.20, Free lambda lt chains at 7.61, Free Kappa/Lambda ratio at 12.25.  MMP 01/24/18 showed all values WNL except for IgM at 244. M Protein not observed.   On review of systems, pt reports cough, ear infection, lower abdomen superficial bruising, and denies fevers, chills, night sweats, leg swelling, testicular pain or swelling, and any other symptoms.   Interval History:   Glen Bolton is here for his scheduled 62-month follow-up for continued monitoring of his light chain MGUS.   Patient was last seen by me on 02/22/2023 and was doing well overall with no new medical complaints. He noted upcoming surgery for glaucoma.   Patient notes that he had glaucoma surgery.  He notes no other acute new symptoms since his last clinic visit.  No fevers no chills no night sweats.  No new focal bone pains. Labs done last week were discussed with her in detail.   MEDICAL HISTORY:  Past Medical History:  Diagnosis Date   Anxiety    Arthritis    Atrial fibrillation, chronic 08/26/2015   Borderline diabetic    Chronic anticoagulation 12/04/2018   CHADS VASC-3-Xarelto    GERD (gastroesophageal reflux disease)    Glaucoma    Hard of hearing    History of gastritis    History of kidney stones    History of stomach ulcers  Hx of migraines    no migraines since he was in his 30's   Hypertension    Osteoarthritis of right knee 12/13/2019   Peripheral neuropathy 12/26/2017   Primary osteoarthritis of left hip 10/25/2015   Sleep apnea    CPAP  not currently working  couldnt breathe with it laying down trying something different end of  April or May 2021    SURGICAL HISTORY: Past Surgical History:  Procedure Laterality Date   CATARACT EXTRACTION Bilateral 2014   COLONOSCOPY     ESOPHAGOGASTRODUODENOSCOPY     EYE SURGERY     INGUINAL HERNIA  REPAIR Left    x 2   Rotator cuff surgery Right 2013   TOTAL HIP ARTHROPLASTY Left 10/27/2015   Procedure: TOTAL HIP ARTHROPLASTY ANTERIOR APPROACH;  Surgeon: Wendolyn Hamburger, MD;  Location: [facility] OR;  Service: Orthopedics;  Laterality: Left;   TOTAL KNEE ARTHROPLASTY Right 12/17/2019   Procedure: RIGHT TOTAL KNEE ARTHROPLASTY;  Surgeon: Wendolyn Hamburger, MD;  Location: [facility];  Service: Orthopedics;  Laterality: Right;    SOCIAL HISTORY: Social History   Socioeconomic History   Marital status: Married    Spouse name: Not on file   Number of children: 2   Years of education: Not on file   Highest education level: Not on file  Occupational History   Occupation: [employer]  Tobacco Use   Smoking status: Never   Smokeless tobacco: Never  Vaping Use   Vaping status: Never Used  Substance and Sexual Activity   Alcohol use: Yes    Alcohol/week: 1.0 standard drink of alcohol    Types: 1 Glasses of wine per week    Comment: daily   Drug use: No   Sexual activity: Not Currently  Other Topics Concern   Not on file  Social History Narrative   Lives with wife   Caffeine use: 2 cups coffee daily   Gun sm[employer]   Right handed    Social Drivers of Health   Financial Resource Strain: Not on file  Food Insecurity: Not on file  Transportation Needs: Not on file  Physical Activity: Not on file  Stress: Not on file  Social Connections: Not on file  Intimate Partner Violence: Not on file    FAMILY HISTORY: Family History  Problem Relation Age of Onset   Hyperlipidemia Brother    Stroke Sister 8    ALLERGIES:  has no known allergies.  MEDICATIONS:  Current Outpatient Medications  Medication Sig Dispense Refill   albuterol (VENTOLIN HFA) 108 (90 Base) MCG/ACT inhaler 2 puffs as needed     albuterol (VENTOLIN HFA) 108 (90 Base) MCG/ACT inhaler SMARTSIG:2 Puff(s) By Mouth Every 4 Hours PRN     chlorthalidone (HYGROTON) 25 MG tablet Take 25 mg by mouth every morning.     dorzolamide   (TRUSOPT ) 2 % ophthalmic solution 1 drop into affected eye     dorzolamide -timolol  (COSOPT ) 22.3-6.8 MG/ML ophthalmic solution Place 1 drop into both eyes 2 (two) times daily.   8   hydrochlorothiazide  (HYDRODIURIL ) 12.5 MG tablet Take 12.5 mg by mouth every evening.      metFORMIN (GLUCOPHAGE) 500 MG tablet Take 500 mg by mouth 2 (two) times daily with a meal.     methylPREDNISolone  (MEDROL  DOSEPAK) 4 MG TBPK tablet 6 day dose pack - take as directed 21 tablet 0   metoprolol  succinate (TOPROL -XL) 50 MG 24 hr tablet TAKE 1 TABLET(50 MG) BY MOUTH DAILY 90 tablet 3   Multiple Vitamin (MULTIVITAMIN  WITH MINERALS) TABS tablet Take 1 tablet by mouth daily.     olmesartan (BENICAR) 40 MG tablet Take 40 mg by mouth daily.   4   Probiotic Product (PROBIOTIC DAILY PO) Take 1 capsule by mouth every evening.     triamcinolone  (NASACORT ) 55 MCG/ACT AERO nasal inhaler Place 2 sprays into the nose daily. 2 sprays each nostril at night on a regular basis 1 each 12   XARELTO  20 MG TABS tablet TAKE 1 TABLET(20 MG) BY MOUTH DAILY WITH SUPPER 90 tablet 1   No current facility-administered medications for this visit.    REVIEW OF SYSTEMS:    10 Point review of Systems was done is negative except as noted above.   PHYSICAL EXAMINATION: .BP 131/67   Pulse 71   Temp (!) 97.5 F (36.4 C)   Resp (!) 71   Wt 267 lb 1.6 oz (121.2 kg)   SpO2 97%   BMI 39.44 kg/m    GENERAL:alert, in no acute distress and comfortable SKIN: no acute rashes, no significant lesions EYES: conjunctiva are pink and non-injected, sclera anicteric OROPHARYNX: MMM, no exudates, no oropharyngeal erythema or ulceration NECK: supple, no JVD LYMPH:  no palpable lymphadenopathy in the cervical, axillary or inguinal regions LUNGS: clear to auscultation b/l with normal respiratory effort HEART: regular rate & rhythm ABDOMEN:  normoactive bowel sounds , non tender, not distended. Extremity: no pedal edema PSYCH: alert & oriented x 3 with  fluent speech NEURO: no focal motor/sensory deficits   LABORATORY DATA:  I have reviewed the data as listed  .    Latest Ref Rng & Units 01/16/2024    9:42 AM 02/15/2023    8:39 AM 01/21/2022    9:39 AM  CBC  WBC 4.0 - 10.5 K/uL 6.4  6.2  6.0   Hemoglobin 13.0 - 17.0 g/dL 40.9  81.1  91.4   Hematocrit 39.0 - 52.0 % 42.6  44.3  43.7   Platelets 150 - 400 K/uL 172  140  156     .    Latest Ref Rng & Units 01/16/2024    9:42 AM 02/15/2023    8:39 AM 01/21/2022    9:39 AM  CMP  Glucose 70 - 99 mg/dL 782  956  213   BUN 8 - 23 mg/dL 24  23  27    Creatinine 0.61 - 1.24 mg/dL 0.86  5.78  4.69   Sodium 135 - 145 mmol/L 138  139  140   Potassium 3.5 - 5.1 mmol/L 3.6  3.8  3.9   Chloride 98 - 111 mmol/L 102  103  102   CO2 22 - 32 mmol/L 31  29  33   Calcium 8.9 - 10.3 mg/dL 9.4  9.4  9.3   Total Protein 6.5 - 8.1 g/dL 6.9  6.8  7.0   Total Bilirubin 0.0 - 1.2 mg/dL 0.9  0.8  1.1   Alkaline Phos 38 - 126 U/L 56  60  63   AST 15 - 41 U/L 23  29  30    ALT 0 - 44 U/L 21  23  31      Component     Latest Ref Rng 02/24/2023 01/16/2024  IgG (Immunoglobin G), Serum     603 - 1,613 mg/dL 629  528   IgA     61 - 437 mg/dL 413  244   IgM (Immunoglobulin M), Srm     15 - 143 mg/dL 010 (H)  272 (H)  Total Protein ELP     6.0 - 8.5 g/dL 6.3 (C) 6.5 (C)  Albumin SerPl Elph-Mcnc     2.9 - 4.4 g/dL 3.6 (C) 3.7 (C)  Alpha 1     0.0 - 0.4 g/dL 0.2 (C) 0.2 (C)  Alpha2 Glob SerPl Elph-Mcnc     0.4 - 1.0 g/dL 0.8 (C) 0.8 (C)  B-Globulin SerPl Elph-Mcnc     0.7 - 1.3 g/dL 0.8 (C) 0.8 (C)  Gamma Glob SerPl Elph-Mcnc     0.4 - 1.8 g/dL 0.8 (C) 0.9 (C)  M Protein SerPl Elph-Mcnc     Not Observed g/dL Not Observed (C) Not Observed (C)  Globulin, Total     2.2 - 3.9 g/dL 2.7 (C) 2.8 (C)  Albumin/Glob SerPl     0.7 - 1.7  1.4 (C) 1.4 (C)  IFE 1 Comment (C) Comment ! (C)  Please Note (HCV): Comment (C) Comment (C)  Kappa free light chain     3.3 - 19.4 mg/L 211.9 (H)  211.0 (H)   Lambda free  light chains     5.7 - 26.3 mg/L 16.9  16.2   Kappa, lambda light chain ratio     0.26 - 1.65  12.54 (H)  13.02 (H)     Legend: (H) High ! Abnormal (C) Corrected  02/03/18 BM Report:    02/03/18 Flow Cytometry:   02/03/18 Cytogenetics:    RADIOGRAPHIC STUDIES: I have personally reviewed the radiological images as listed and agreed with the findings in the report. No results found.  ASSESSMENT & PLAN:   83 y.o. male with  1. Light chain MGUS  PET/CT from 02/06/18 did not reveal any bone lesions and revealed Mild low level FDG uptake throughout the bone marrow is noted.  Labs upon initial presentation from 02/03/18 and 01/24/18, Free kappa lt chains in the urine were elevated at 93.20. No anemia, no abnormal kidney functions. Nonspecific and may be physiologic.  IgM increase noted without M-spike 02/03/18 BM bx pathology indicated 2% plasma cells 02/03/18 Cytogenetics showed normal genetics without specific mutation Slow moving lymphoma or plasma cell disorder vs reactive to chronic inflammation with frequent sinus and ear infections ?  PLAN:  -discussed lab results from today, 01/23/2024, in detail with patient - CBC is stable with no anemia and normal WBC count and platelets CMP Shows normal renal function and no hypercalcemia Serum free light chains showed elevated kappa light chains of 211 with a kappa lambda ratio of 13.  This is stable and shows no significant changes from his last labs nearly year ago. Myeloma panel shows no M spike. Patient does not have any lab or clinical findings suggestive of progression to active multiple myeloma at this time. Continue follow-up with PCP for management of other medical concerns  FOLLOW UP: RTC with Dr Salomon Cree in 11 months Labs 1 week prior to visit   The total time spent in the appointment was 20 minutes* .  All of the patient's questions were answered with apparent satisfaction. The patient knows to call the clinic with any  problems, questions or concerns.   Jacquelyn Matt MD MS AAHIVMS [facility] Hematology/Oncology Physician [facility]  .*Total Encounter Time as defined by the Centers for Medicare and Medicaid Services includes, in addition to the face-to-face time of a patient visit (documented in the note above) non-face-to-face time: obtaining and reviewing outside history, ordering and reviewing medications, tests or procedures, care coordination (communications with other health care professionals  or caregivers) and documentation in the medical record.    I,Mitra Faeizi,acting as a Neurosurgeon for Jacquelyn Matt, MD.,have documented all relevant documentation on the behalf of Jacquelyn Matt, MD,as directed by  Jacquelyn Matt, MD while in the presence of Jacquelyn Matt, MD.  .I have reviewed the above documentation for accuracy and completeness, and I agree with the above. .Brandon Wiechman Kishore Sanaia Jasso MD

## 2024-01-23 ENCOUNTER — Inpatient Hospital Stay: Payer: Medicare Other | Admitting: Hematology

## 2024-01-23 VITALS — BP 131/67 | HR 71 | Temp 97.5°F | Resp 71 | Wt 267.1 lb

## 2024-01-23 DIAGNOSIS — R768 Other specified abnormal immunological findings in serum: Secondary | ICD-10-CM | POA: Diagnosis not present

## 2024-01-23 DIAGNOSIS — D472 Monoclonal gammopathy: Secondary | ICD-10-CM | POA: Diagnosis not present

## 2024-02-01 DIAGNOSIS — H401133 Primary open-angle glaucoma, bilateral, severe stage: Secondary | ICD-10-CM | POA: Diagnosis not present

## 2024-02-07 ENCOUNTER — Telehealth: Payer: Self-pay | Admitting: Cardiology

## 2024-02-07 NOTE — Telephone Encounter (Signed)
 Pt c/o medication issue:  1. Name of Medication:   XARELTO  20 MG TABS tablet   2. How are you currently taking this medication (dosage and times per day)?   As prescribed  3. Are you having a reaction (difficulty breathing--STAT)?   No  4. What is your medication issue?   Patient wants to know if he can take Eliquis , 5mg  instead of Xarelto .

## 2024-02-07 NOTE — Telephone Encounter (Signed)
 Please review and advise.

## 2024-02-08 ENCOUNTER — Other Ambulatory Visit: Payer: Self-pay

## 2024-02-08 MED ORDER — APIXABAN 5 MG PO TABS: 5.0000 mg | ORAL_TABLET | Freq: Two times a day (BID) | ORAL | 5 refills | Status: AC

## 2024-02-08 NOTE — Telephone Encounter (Signed)
 Prescription refill request for Eliquis  received. Indication:afib Last office visit:needs appt Scr:0.84  5/25 Age: 83 Weight:121.2  kg  Prescription refilled

## 2024-02-09 NOTE — Telephone Encounter (Signed)
 Pt age 83, wt 121.2 kg, SCr 0.84.  Okay to switch to Eliquis  5 mg.  Will need to take first dose of Eliquis  24 hours after final dose of Xarelto .   Please fill for 3 month supply with 1 refill when patient ready to switch.

## 2024-02-09 NOTE — Telephone Encounter (Signed)
 Called patient back- when asking pt which pharmacy to send new medication to- pt states " well my wife has a 3 month supply that she no longer needs so I was just going to take that" Advised patient we cannot recommend that he use someone else's prescription. Patient states in that case he doesn't need to switch.

## 2024-05-15 DIAGNOSIS — L821 Other seborrheic keratosis: Secondary | ICD-10-CM | POA: Diagnosis not present

## 2024-05-15 DIAGNOSIS — Z85828 Personal history of other malignant neoplasm of skin: Secondary | ICD-10-CM | POA: Diagnosis not present

## 2024-05-15 DIAGNOSIS — L814 Other melanin hyperpigmentation: Secondary | ICD-10-CM | POA: Diagnosis not present

## 2024-05-15 DIAGNOSIS — Z08 Encounter for follow-up examination after completed treatment for malignant neoplasm: Secondary | ICD-10-CM | POA: Diagnosis not present

## 2024-05-15 DIAGNOSIS — L578 Other skin changes due to chronic exposure to nonionizing radiation: Secondary | ICD-10-CM | POA: Diagnosis not present

## 2024-05-28 DIAGNOSIS — I272 Pulmonary hypertension, unspecified: Secondary | ICD-10-CM | POA: Diagnosis not present

## 2024-05-28 DIAGNOSIS — Z23 Encounter for immunization: Secondary | ICD-10-CM | POA: Diagnosis not present

## 2024-05-28 DIAGNOSIS — D472 Monoclonal gammopathy: Secondary | ICD-10-CM | POA: Diagnosis not present

## 2024-05-28 DIAGNOSIS — Z79899 Other long term (current) drug therapy: Secondary | ICD-10-CM | POA: Diagnosis not present

## 2024-05-28 DIAGNOSIS — I4891 Unspecified atrial fibrillation: Secondary | ICD-10-CM | POA: Diagnosis not present

## 2024-05-28 DIAGNOSIS — E1165 Type 2 diabetes mellitus with hyperglycemia: Secondary | ICD-10-CM | POA: Diagnosis not present

## 2024-05-28 DIAGNOSIS — E7800 Pure hypercholesterolemia, unspecified: Secondary | ICD-10-CM | POA: Diagnosis not present

## 2024-05-28 DIAGNOSIS — Z0000 Encounter for general adult medical examination without abnormal findings: Secondary | ICD-10-CM | POA: Diagnosis not present

## 2024-06-02 ENCOUNTER — Other Ambulatory Visit: Payer: Self-pay | Admitting: Cardiology

## 2024-06-04 ENCOUNTER — Other Ambulatory Visit: Payer: Self-pay | Admitting: Cardiology

## 2024-06-05 DIAGNOSIS — H401133 Primary open-angle glaucoma, bilateral, severe stage: Secondary | ICD-10-CM | POA: Diagnosis not present

## 2024-06-21 NOTE — Progress Notes (Signed)
  Cardiology Office Note:   Date:  06/22/2024  ID:  Glen Bolton, DOB [birth year], MRN [id number] PCP: Regino Slater, MD  [facility] Providers Cardiologist:  Lynwood Schilling, MD {  History of Present Illness:   Glen Bolton is a 83 y.o. male who presents for evaluation of atrial fibrillation.  Since I last saw him he has had no new cardiac complaints.   The patient denies any new symptoms such as chest discomfort, neck or arm discomfort. There has been no new shortness of breath, PND or orthopnea. There have been no reported palpitations, presyncope or syncope.    ROS:   Positive for double vision.   Studies Reviewed:    EKG:   EKG Interpretation Date/Time:  Friday June 22 2024 16:20:06 EDT Ventricular Rate:  63 PR Interval:    QRS Duration:  100 QT Interval:  428 QTC Calculation: 437 R Axis:   -8  Text Interpretation: Atrial fibrillation When compared with ECG of 12-Dec-2019 11:12, No significant change was found Confirmed by Schilling Lynwood ([id number]) on 06/22/2024 4:40:22 PM   Risk Assessment/Calculations:              Physical Exam:   VS:  BP 121/71 (BP Location: Left Arm, Patient Position: Sitting, Cuff Size: Large)   Pulse (!) 56   Ht 5' 9 (1.753 m)   Wt 267 lb 12.8 oz (121.5 kg)   SpO2 91%   BMI 39.55 kg/m    Wt Readings from Last 3 Encounters:  06/22/24 267 lb 12.8 oz (121.5 kg)  01/23/24 267 lb 1.6 oz (121.2 kg)  02/22/23 258 lb 9.6 oz (117.3 kg)     GEN: Well nourished, well developed in no acute distress NECK: No JVD; No carotid bruits CARDIAC: Irregular RR, no murmurs, rubs, gallops RESPIRATORY:  Clear to auscultation without rales, wheezing or rhonchi  ABDOMEN: Soft, non-tender, non-distended EXTREMITIES:  No edema; No deformity   ASSESSMENT AND PLAN:   CAF- [name] has a CHA2DS2 - VASc score of 3.   Tolerates anticoagulation and has had good rate control.  No change in therapy.  Essential HTN Her blood pressure is at target.  He  is on both chlorthalidone and HCTZ it appears   at target.  I am going to stop the HCTZ but he is to go home and make sure we have the accurate medicine list.   Sleep apnea He is not using his CPAP because of postnasal drip.  Pulmonary hypertension Clinically I would not suspect that this is any worse.  This was very mild on echo in 2023.  No further evaluation.    Follow up with me in 1 year  Signed, Lynwood Schilling, MD

## 2024-06-22 ENCOUNTER — Ambulatory Visit: Attending: Cardiology | Admitting: Cardiology

## 2024-06-22 ENCOUNTER — Encounter: Payer: Self-pay | Admitting: Cardiology

## 2024-06-22 VITALS — BP 121/71 | HR 56 | Ht 69.0 in | Wt 267.8 lb

## 2024-06-22 DIAGNOSIS — I482 Chronic atrial fibrillation, unspecified: Secondary | ICD-10-CM

## 2024-06-22 DIAGNOSIS — G4730 Sleep apnea, unspecified: Secondary | ICD-10-CM

## 2024-06-22 DIAGNOSIS — I272 Pulmonary hypertension, unspecified: Secondary | ICD-10-CM | POA: Diagnosis not present

## 2024-06-22 DIAGNOSIS — I10 Essential (primary) hypertension: Secondary | ICD-10-CM | POA: Diagnosis not present

## 2024-06-22 NOTE — Patient Instructions (Signed)
 Medication Instructions:  Stop hydrochlorothiazide  *If you need a refill on your cardiac medications before your next appointment, please call your pharmacy*  Lab Work: NONE If you have labs (blood work) drawn today and your tests are completely normal, you will receive your results only by: MyChart Message (if you have MyChart) OR A paper copy in the mail If you have any lab test that is abnormal or we need to change your treatment, we will call you to review the results.  Testing/Procedures: NONE  Follow-Up: At [facility] you and your health needs are our priority.  As part of our continuing mission to provide you with exceptional heart care, our providers are all part of one team.  This team includes your primary Cardiologist (physician) and Advanced Practice Providers or APPs (Physician Assistants and Nurse Practitioners) who all work together to provide you with the care you need, when you need it.  Your next appointment:   1 year  Provider:   [place], MD  We recommend signing up for the patient portal called MyChart.  Sign up information is provided on this After Visit Summary.  MyChart is used to connect with patients for Virtual Visits (Telemedicine).  Patients are able to view lab/test results, encounter notes, upcoming appointments, etc.  Non-urgent messages can be sent to your provider as well.   To learn more about what you can do with MyChart, go to [email or web address].

## 2024-07-05 ENCOUNTER — Other Ambulatory Visit: Payer: Self-pay | Admitting: Cardiology

## 2024-12-24 ENCOUNTER — Other Ambulatory Visit

## 2024-12-24 ENCOUNTER — Ambulatory Visit: Admitting: Hematology
